# Patient Record
Sex: Male | Born: 1985 | Race: White | Hispanic: Yes | Marital: Single | Smoking: Former smoker
Health system: Southern US, Community
[De-identification: ages and names within clinical notes are randomized; demographics above are authoritative.]

## PROBLEM LIST (undated history)

## (undated) DIAGNOSIS — E559 Vitamin D deficiency, unspecified: Secondary | ICD-10-CM

## (undated) DIAGNOSIS — E079 Disorder of thyroid, unspecified: Secondary | ICD-10-CM

## (undated) DIAGNOSIS — C801 Malignant (primary) neoplasm, unspecified: Secondary | ICD-10-CM

---

## 2009-12-21 ENCOUNTER — Emergency Department (HOSPITAL_COMMUNITY): Admission: EM | Admit: 2009-12-21 | Discharge: 2009-12-21 | Payer: Self-pay | Admitting: Emergency Medicine

## 2012-06-02 ENCOUNTER — Inpatient Hospital Stay (HOSPITAL_COMMUNITY)
Admission: AD | Admit: 2012-06-02 | Discharge: 2012-06-03 | DRG: 836 | Disposition: A | Discharge status: Other Acute Inpatient Hospital | Payer: MEDICAID | Source: Ambulatory Visit | Attending: Internal Medicine | Admitting: Internal Medicine

## 2012-06-02 ENCOUNTER — Encounter (HOSPITAL_COMMUNITY): Payer: Self-pay

## 2012-06-02 ENCOUNTER — Inpatient Hospital Stay (HOSPITAL_COMMUNITY): Payer: MEDICAID

## 2012-06-02 DIAGNOSIS — D72829 Elevated white blood cell count, unspecified: Secondary | ICD-10-CM | POA: Diagnosis present

## 2012-06-02 DIAGNOSIS — R531 Weakness: Secondary | ICD-10-CM | POA: Diagnosis present

## 2012-06-02 DIAGNOSIS — R5383 Other fatigue: Secondary | ICD-10-CM | POA: Diagnosis present

## 2012-06-02 DIAGNOSIS — R5381 Other malaise: Secondary | ICD-10-CM | POA: Diagnosis present

## 2012-06-02 DIAGNOSIS — D696 Thrombocytopenia, unspecified: Secondary | ICD-10-CM | POA: Diagnosis present

## 2012-06-02 DIAGNOSIS — C9500 Acute leukemia of unspecified cell type not having achieved remission: Principal | ICD-10-CM | POA: Diagnosis present

## 2012-06-02 DIAGNOSIS — B356 Tinea cruris: Secondary | ICD-10-CM | POA: Diagnosis present

## 2012-06-02 DIAGNOSIS — D649 Anemia, unspecified: Secondary | ICD-10-CM | POA: Diagnosis present

## 2012-06-02 LAB — IRON AND TIBC
Iron: 326 ug/dL — ABNORMAL HIGH (ref 42–135)
UIBC: <15 ug/dL — ABNORMAL LOW (ref 125–400)

## 2012-06-02 LAB — CBC WITH DIFFERENTIAL/PLATELET
Eosinophils Absolute: 0.1 10*3/uL (ref 0.0–0.7)
Eosinophils Relative: 0 % (ref 0–5)
Hemoglobin: 5 g/dL — CL (ref 13.0–17.0)
Lymphs Abs: 24.2 10*3/uL — ABNORMAL HIGH (ref 0.7–4.0)
MCH: 30.5 pg (ref 26.0–34.0)
MCV: 86.6 fL (ref 78.0–100.0)
Monocytes Absolute: 1.4 10*3/uL — ABNORMAL HIGH (ref 0.1–1.0)
Monocytes Relative: 5 % (ref 3–12)
Platelets: 130 10*3/uL — ABNORMAL LOW (ref 150–400)
RBC: 1.64 MIL/uL — ABNORMAL LOW (ref 4.22–5.81)

## 2012-06-02 LAB — COMPREHENSIVE METABOLIC PANEL
AST: 15 U/L (ref 0–37)
Albumin: 3.8 g/dL (ref 3.5–5.2)
Chloride: 105 mEq/L (ref 96–112)
Creatinine, Ser: 0.89 mg/dL (ref 0.50–1.35)
Potassium: 4.2 mEq/L (ref 3.5–5.1)
Total Bilirubin: 0.4 mg/dL (ref 0.3–1.2)

## 2012-06-02 LAB — ABO/RH: ABO/RH(D): O POS

## 2012-06-02 MED ORDER — SODIUM CHLORIDE 0.9 % IJ SOLN: 3.0000 mL | INTRAMUSCULAR | Status: DC | PRN

## 2012-06-02 MED ORDER — PNEUMOCOCCAL VAC POLYVALENT 25 MCG/0.5ML IJ INJ
0.5000 mL | INJECTION | INTRAMUSCULAR | Status: AC
  Administered 2012-06-03: 0.5 mL via INTRAMUSCULAR
  Filled 2012-06-02: qty 0.5

## 2012-06-02 MED ORDER — ONDANSETRON HCL 4 MG PO TABS: 4.0000 mg | ORAL_TABLET | Freq: Four times a day (QID) | ORAL | Status: DC | PRN

## 2012-06-02 MED ORDER — ONDANSETRON HCL 4 MG/2ML IJ SOLN: 4.0000 mg | Freq: Four times a day (QID) | INTRAMUSCULAR | Status: DC | PRN

## 2012-06-02 MED ORDER — POLYETHYLENE GLYCOL 3350 17 G PO PACK
17.0000 g | PACK | Freq: Every day | ORAL | Status: DC | PRN
  Filled 2012-06-02: qty 1

## 2012-06-02 MED ORDER — SODIUM CHLORIDE 0.9 % IJ SOLN
3.0000 mL | Freq: Two times a day (BID) | INTRAMUSCULAR | Status: DC
  Administered 2012-06-02 – 2012-06-03 (×3): 3 mL via INTRAVENOUS

## 2012-06-02 MED ORDER — INFLUENZA VIRUS VACC SPLIT PF IM SUSP
0.5000 mL | INTRAMUSCULAR | Status: AC
  Administered 2012-06-03: 0.5 mL via INTRAMUSCULAR
  Filled 2012-06-02: qty 0.5

## 2012-06-02 MED ORDER — ACETAMINOPHEN 650 MG RE SUPP: 650.0000 mg | Freq: Four times a day (QID) | RECTAL | Status: DC | PRN

## 2012-06-02 MED ORDER — TERBINAFINE HCL 250 MG PO TABS
250.0000 mg | ORAL_TABLET | Freq: Every day | ORAL | Status: DC
  Administered 2012-06-02 – 2012-06-03 (×2): 250 mg via ORAL
  Filled 2012-06-02 (×2): qty 1

## 2012-06-02 MED ORDER — SODIUM CHLORIDE 0.9 % IV SOLN: 250.0000 mL | INTRAVENOUS | Status: DC | PRN

## 2012-06-02 MED ORDER — ACETAMINOPHEN 325 MG PO TABS
650.0000 mg | ORAL_TABLET | Freq: Four times a day (QID) | ORAL | Status: DC | PRN
  Administered 2012-06-02: 650 mg via ORAL
  Filled 2012-06-02: qty 2

## 2012-06-02 MED ORDER — HEPARIN SODIUM (PORCINE) 5000 UNIT/ML IJ SOLN
5000.0000 [IU] | Freq: Three times a day (TID) | INTRAMUSCULAR | Status: DC
  Administered 2012-06-02 – 2012-06-03 (×5): 5000 [IU] via SUBCUTANEOUS
  Filled 2012-06-02 (×6): qty 1

## 2012-06-02 NOTE — Progress Notes (Signed)
Initial review for inpatient status is complete. 

## 2012-06-02 NOTE — Progress Notes (Signed)
CRITICAL VALUE ALERT  Critical value received:  HGB 5.0  Date of notification:  06/02/2012   Time of notification:  4:02 PM   Critical value read back:yes  Nurse who received alert:  Armanda Heritage   MD notified (1st page):  DR David Stall  Time of first page:  4:03 PM   MD notified (2nd page): David Stall   Time of second page:4:29 PM   Responding MD:  David Stall  Time MD responded:  4:31 PM

## 2012-06-02 NOTE — H&P (Signed)
[facility] History and Physical  Wayne Grimes YQM:[id number] DOB: 1986-04-14 DOA: 06/02/2012  Referring physician: Dr. Alden Benjamin PCP: No primary provider on file.  Specialists: none  Chief Complaint: Generalized weakness  HPI: Wayne Grimes is a 26 y.o. male  This is a 26 year old male with no significant past medical history that went to his primary care doctor 2 days prior to admission he has a generalized weakness. A CBC was done results are back he was called in by her physician for a hemoglobin of 5. He was admitted as a direct admission to [facility]. The patient has been complaining of generalized weakness for 3 weeks prior to admission progressively getting worse. He also relates he has does not have any appetite. He's also been noticing that he is pale. He relates no petechiae, no fever or chills, no night sweats. No difficulty swallowing no joint pain. No rectal bleeding, hematemesis. He denies any NSAIDs use. No lymphadenopathy.  Review of Systems: The patient denies anorexia, fever, weight loss,, vision loss, decreased hearing, hoarseness, chest pain, syncope, dyspnea on exertion, peripheral edema, balance deficits, hemoptysis, abdominal pain, melena, hematochezia, severe indigestion/heartburn, hematuria, incontinence, genital sores, muscle weakness, suspicious skin lesions, transient blindness, difficulty walking, depression, unusual weight change, abnormal bleeding, enlarged lymph nodes, angioedema, and breast masses.    History reviewed. No pertinent past medical history. History reviewed. No pertinent past surgical history. Social History:  reports that he quit smoking about a year ago. His smoking use included Cigarettes. He has a .2 pack-year smoking history. He has never used smokeless tobacco. He reports that he drinks about 1.8 ounces of alcohol per week. He reports that he does not use illicit drugs. Lives at home with his cousins can perform his  ADLs.  Allergies  Allergen Reactions  . Orange Juice (Orange Oil) Other (See Comments)    Eyes swell    History reviewed. No pertinent family history. mother is alive has diabetes. Does not know his father Prior to Admission medications   Not on File   Physical Exam: Filed Vitals:   06/02/12 1120  BP: 132/74  Pulse: 102  Temp: 99.5 F (37.5 C)  Resp: 20  Height: 5\' 8"  (1.727 m)  Weight: 99.791 kg (220 lb)  SpO2: 100%     General:  He is awake alert and oriented x3 in no acute distress  Eyes: Anicteric extraocular movements are intact  ENT: Moist mucous membranes no gingival hypertrophy no lymphadenopathy  Neck: No lymphadenopathy no JVD  Cardiovascular: Regular rate and rhythm  Respiratory: Air movement clear to auscultation  Abdomen: Bowel sounds nontender nondistended and soft. He does have fullness on his left flank  Skin: Tinea corporis in his groin  Musculoskeletal: No Deformities  Psychiatric: Appropriate  Neurologic: Awake alert and oriented x3 nonfocal  Labs on Admission:  Basic Metabolic Panel: No results found for this basename: NA:5,K:5,CL:5,CO2:5,GLUCOSE:5,BUN:5,CREATININE:5,CALCIUM:5,MG:5,PHOS:5 in the last 168 hours Liver Function Tests: No results found for this basename: AST:5,ALT:5,ALKPHOS:5,BILITOT:5,PROT:5,ALBUMIN:5 in the last 168 hours No results found for this basename: LIPASE:5,AMYLASE:5 in the last 168 hours No results found for this basename: AMMONIA:5 in the last 168 hours CBC: No results found for this basename: WBC:5,NEUTROABS:5,HGB:5,HCT:5,MCV:5,PLT:5 in the last 168 hours Cardiac Enzymes: No results found for this basename: CKTOTAL:5,CKMB:5,CKMBINDEX:5,TROPONINI:5 in the last 168 hours  BNP (last 3 results) No results found for this basename: PROBNP:3 in the last 8760 hours CBG: No results found for this basename: GLUCAP:5 in the last 168 hours  Radiological Exams on Admission:  No results found.  EKG: Independently  reviewed: pending  Assessment/Plan Principal Problem: Generalized weakness/ Anemia/ Leukocytosis/ Thrombocytopenia: Per his primary care doctor his hemoglobin was 5 white count was 30 and platelet count was in the 130s. So he was directly admitted for blood transfusion. I am concerned of leukemia (AML) in this 26 year old. Go ahead and check an anemia panel check a TSH, RBC folate and B12. We'll go ahead and get a blood flow cytometry, peripheral smear to be read by the pathologist. Also check his chest x-ray. On physical examination his testicles are within normal limits. Guaiac his stools. Also get an abdominal ultrasound is there is left flank fullness this is probably explained. Lymphoma is also in the differential he noticed no palpable lymphadenopathy,Chest x-ray will help.  Tinea cruris: We'll start him on Lamisil.  Code Status: full Family Communication: none Disposition Plan: home 2-3 days  Time spent: [address] Rosine Beat [facility] Pager [phone]  If 7PM-7AM, please contact night-coverage [email or web address] Password [email or web address] 06/02/2012, 12:31 PM

## 2012-06-03 ENCOUNTER — Inpatient Hospital Stay (HOSPITAL_COMMUNITY): Payer: MEDICAID

## 2012-06-03 DIAGNOSIS — C9500 Acute leukemia of unspecified cell type not having achieved remission: Secondary | ICD-10-CM

## 2012-06-03 LAB — PREPARE RBC (CROSSMATCH)

## 2012-06-03 LAB — PATHOLOGIST SMEAR REVIEW

## 2012-06-03 LAB — SAVE SMEAR

## 2012-06-03 LAB — CBC
HCT: 18.8 % — ABNORMAL LOW (ref 39.0–52.0)
Hemoglobin: 6.4 g/dL — CL (ref 13.0–17.0)
RDW: 15.1 % (ref 11.5–15.5)
WBC: 35.8 10*3/uL — ABNORMAL HIGH (ref 4.0–10.5)

## 2012-06-03 LAB — FOLATE: Folate: 9.4 ng/mL

## 2012-06-03 LAB — HEMOGLOBIN AND HEMATOCRIT, BLOOD: Hemoglobin: 8.4 g/dL — ABNORMAL LOW (ref 13.0–17.0)

## 2012-06-03 NOTE — Progress Notes (Signed)
Clinical Social Worker received referral for pt needing spanish interpreter. Clinical Social Worker was notified by RN that pt MD is spanish speaking and Visual merchandiser provided RN with phone number to Geneticist, molecular in order to arrange spanish speaking interpreter when needed. RN discussed that plan is for pt to transfer to [facility] when bed available. No further social work needs identified at this time. Clinical Social Worker signing off.  Please re-consult if social work needs arise.    Jacklynn Lewis, MSW, LCSWA  Clinical Social Work [phone]

## 2012-06-03 NOTE — Discharge Summary (Signed)
Physician Discharge Summary  Marland Reine GEX:[id number] DOB: 10/17/1985 DOA: 06/02/2012  PCP: No primary provider on file.  Admit date: 06/02/2012 Discharge date: 06/03/2012  Time spent: 30 minutes  Recommendations for Outpatient Follow-up:  1. Transfer to [facility]  Discharge Diagnoses:  Principal Problem:  *Generalized weakness Active Problems:  Anemia  Leukocytosis  Thrombocytopenia  Tinea cruris   Discharge Condition: stable  Diet recommendation: regular  Filed Weights   06/02/12 1120  Weight: 99.791 kg (220 lb)    History of present illness:  This is a 26 year old male with no significant past medical history that went to his primary care doctor 2 days prior to admission he has a generalized weakness. A CBC was done results are back he was called in by her physician for a hemoglobin of 5. He was admitted as a direct admission to [facility]. The patient has been complaining of generalized weakness for 3 weeks prior to admission progressively getting worse. He also relates he has does not have any appetite. He's also been noticing that he is pale. He relates no petechiae, no fever or chills, no night sweats. No difficulty swallowing no joint pain. No rectal bleeding, hematemesis. He denies any NSAIDs use. No lymphadenopathy.   Hospital Course:  Generalized weakness/ Anemia/ Leukocytosis/ Thrombocytopenia:  -peripheral smear compatible with Leukemia at the time of d/c report not in system Discuss with hematologist who has seen the smear. -WBC increase from 26-> 35K, thrombocytopenia a 94 nd anemia s/p transfusion 3 units. He is now symptoms free. -CXR no mass. Abd [place] normal size spleen. -discuss with hematologist and agree that the patient should be transfer to a tertiary center.  Tinea cruris:  We'll start him on Lamisil.  Procedures:  none  Consultations:  Dr. Arline Asp  Discharge Exam: Filed Vitals:   06/02/12 2146 06/02/12 2246 06/02/12  2337 06/03/12 0600  BP: 134/70 126/63 120/64 122/70  Pulse: 92 99 78 78  Temp: 98.7 F (37.1 C) 98.5 F (36.9 C) 98.3 F (36.8 C) 98.2 F (36.8 C)  TempSrc: Oral Oral Oral Oral  Resp: 18 18 16 20   Height:      Weight:      SpO2:    100%    General: A&O x3  Cardiovascular: RRR Respiratory: goos air movement CTA B/L  Discharge Instructions  Discharge Orders    Future Orders Please Complete By Expires   Diet - low sodium heart healthy      Increase activity slowly          Medication List    Notice       You have not been prescribed any medications.           The results of significant diagnostics from this hospitalization (including imaging, microbiology, ancillary and laboratory) are listed below for reference.    Significant Diagnostic Studies: X-ray Chest Pa And Lateral   06/02/2012  *RADIOLOGY REPORT*  Clinical Data: Weakness  CHEST - 2 VIEW  Comparison: None.  Findings: The lungs are clear.  Mediastinal contours appear normal. The heart is within normal limits in size.  No bony abnormality is seen.  IMPRESSION: No active lung disease.   Original Report Authenticated By: Juline Patch, M.D.    Abd 1 View (kub)  06/02/2012  *RADIOLOGY REPORT*  Clinical Data: Weakness.  Abdominal pain.  ABDOMEN - 1 VIEW  Comparison: None  Findings: The bowel gas pattern is normal.  The soft tissue shadows are maintained.  No worrisome calcifications.  The bony structures are intact.  IMPRESSION: Unremarkable abdominal radiograph.   Original Report Authenticated By: P. Loralie Champagne, M.D.    US Abdomen Complete  06/03/2012  *RADIOLOGY REPORT*  Clinical Data:  Abdominal fullness.  COMPLETE ABDOMINAL ULTRASOUND  Comparison:  None.  Findings:  Gallbladder:  No gallstones, gallbladder wall thickening, or pericholecystic fluid.  Common bile duct:   Within normal limits in caliber.  Liver:  No focal lesion identified.  Within normal limits in parenchymal echogenicity.  IVC:  Limited  visualization, grossly unremarkable.  Pancreas:  Limited visualization due to overlying bowel gas, visualized portions unremarkable.  The pancreatic head and pancreatic tail not well visualized.  Spleen:  Within normal limits in size and echotexture.  Right Kidney:   Normal in size and parenchymal echogenicity.  No evidence of mass or hydronephrosis.  Left Kidney:  Normal in size and parenchymal echogenicity.  No evidence of mass or hydronephrosis.  Abdominal aorta:  No aneurysm identified.  IMPRESSION: No acute findings.  Midline structures difficult to visualize due to overlying bowel gas.   Original Report Authenticated By: Charlett Nose, M.D.     Microbiology: No results found for this or any previous visit (from the past 240 hour(s)).   Labs: Basic Metabolic Panel:  Lab 06/02/12 1610  NA 140  K 4.2  CL 105  CO2 25  GLUCOSE 118*  BUN 17  CREATININE 0.89  CALCIUM 9.1  MG --  PHOS --   Liver Function Tests:  Lab 06/02/12 1425  AST 15  ALT 17  ALKPHOS 99  BILITOT 0.4  PROT 7.2  ALBUMIN 3.8   No results found for this basename: LIPASE:5,AMYLASE:5 in the last 168 hours No results found for this basename: AMMONIA:5 in the last 168 hours CBC:  Lab 06/03/12 0200 06/02/12 1425  WBC 35.8* 26.1*  NEUTROABS -- 0.4*  HGB 6.4* 5.0*  HCT 18.8* 14.2*  MCV 86.6 86.6  PLT 94* 130*   Cardiac Enzymes: No results found for this basename: CKTOTAL:5,CKMB:5,CKMBINDEX:5,TROPONINI:5 in the last 168 hours BNP: BNP (last 3 results) No results found for this basename: PROBNP:3 in the last 8760 hours CBG: No results found for this basename: GLUCAP:5 in the last 168 hours     Signed:  Marinda Elk  [facility] 06/03/2012, 10:04 AM

## 2012-06-03 NOTE — Progress Notes (Signed)
Patient's hgb 6.4 after receiving 2 units of prbcs, Dr. Craige Cotta notified. Arlington Calix, RN

## 2012-06-03 NOTE — Consult Note (Signed)
ONCOLOGY  HOSPITAL CONSULTATION NOTE  Wayne Grimes                                MR#: [id number]  DOB: [birth year]                       CSN#: [id number]  Referring MD: Dr. Emmie Niemann Hospitalists, Dr. David Stall   Primary MD: Dr. Brent General   Reason for Consult:  Rule Out Leukemia   Wayne Grimes is a 26 y.o. Hispanic  male with no prior documented medical history, admitted on 06/02/2012 directly from the Urgent Care office at [facility]  after being found to have severe anemia with a Hb of 5, WBC 30,000 and platelets 130, suspicious of acute leukemia. Patient had presented to the facility with 3 week history of generalized weakness and fatigue, decreased appetite and pallor. He denied acute bleeding episodes, rashes, recent infections, fever, chills or night sweats.He denied adenopathy, or areas of pain. Upon admission, new labs demonstrated a H/H of 5/14.2, WBC at 26.1 and platelets 130 k . He received 2 units of blood, with current Hb at 6.4, Hct 18.8 after the  first unit, WBC now at 35.8 and platelets at 94,000. US abdomen is pending. Hemoccult pending.Smear has been reviewed by Dr. Arline Asp, please see below for details. 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 No other labs or films are present for evaluation.   Patient denies any history or family history of leukemia or other bone marrow disorders.Never had a bone marrow biopsy in the past. We were requested to see this patient with recommendations.        PMH: None  Surgeries: None  Allergies:  Allergies  Allergen Reactions  . Orange Juice (Orange Oil) Other (See Comments)    Eyes swell    Medications:   Prior to Admission:  No prescriptions prior to admission      . heparin  5,000 Units Subcutaneous Q8H  . influenza  inactive virus vaccine  0.5 mL Intramuscular Tomorrow-1000  . pneumococcal 23 valent vaccine  0.5 mL Intramuscular Tomorrow-1000  . sodium chloride  3 mL Intravenous Q12H  .  terbinafine  250 mg Oral Daily    YQM:VHQION chloride, acetaminophen, acetaminophen, ondansetron (ZOFRAN) IV, ondansetron, polyethylene glycol, sodium chloride  ROS: See HPI for significant positives. Constitutional: Negative  for fever, chills  night sweats.  Eyes: Negative for blurred vision and double vision.  Respiratory: Negative for productive cough. No hemoptysis.No hoarseness.No neck swelling.  Positive for shortness of breath on exertion. No pleuritic chest pain.  Cardiovascular: Negative for chest pain. No palpitations.  GI: Negative for  nausea, vomiting, diarrhea or constipation. No change in bowel caliber. No  Melena or hematochezia. Negative for abdominal pain.  GU: Negative for hematuria. No loss of urinary control. No urinary retention.No changes in urine color.  Skin: Negative for itching. No rash. No petechia. No easy  Bruising. Musculoskeletal: Denies back tendernes  No arm pain.  Neurological: No headaches.No confusion. No motor or sensory deficits.  Family History:    Mother alive with diabetes. He does not know his father.  2 brothers in good healthNo family members with hematological disorders.    Social History:  reports that he quit smoking about a year ago. His smoking use included Cigarettes. He has a .2 pack-year smoking history. He has never used smokeless tobacco. He reports that  he drinks about 1.8 ounces of alcohol per week. He reports that he does not use illicit drugs.Lives at home with his cousins. Came from [place] 7 years ago. Works as a Designer, fashion/clothing. No insurance.   Physical Exam    Filed Vitals:   06/03/12 0600  BP: 122/70  Pulse: 78  Temp: 98.2 F (36.8 C)  Resp: 72     General:26 y.o. male. in no acute distress A. and O. x3  well-developed, well-nourished.  HEENT: Normocephalic, atraumatic, PERRLA. Sclerae anicteric. Oral cavity without thrush or lesions. NECK:supple. no thyromegaly, no cervical or supraclavicular adenopathy  LUNGS: clear  bilaterally . No wheezing, rhonchi or rales. No axillary masses. BREASTS: not examined. CARDIOVASCULAR: regular rate and rhythm normal S1-S2, no murmur , rubs or gallops ABDOMEN: soft nontender , bowel sounds x4. No HSM. No masses palpable.  GU/rectal: deferred. EXTREMITIES: no clubbing cyanosis or edema. No bruising or petechial rash MUSCULOSKELETAL: no spinal tenderness.  NEURO: Non Focal. No Horner's.   Labs:  CBC   Lab 06/03/12 0200 06/02/12 1425  WBC 35.8* 26.1*  HGB 6.4* 5.0*  HCT 18.8* 14.2*  PLT 94* 130*  MCV 86.6 86.6  MCH 29.5 30.5  MCHC 34.0 35.2  RDW 15.1 15.7*  LYMPHSABS -- 24.2*  MONOABS -- 1.4*  EOSABS -- 0.1  BASOSABS -- 0.0  BANDABS -- --     CMP    Lab 06/02/12 1425  NA 140  K 4.2  CL 105  CO2 25  GLUCOSE 118*  BUN 17  CREATININE 0.89  CALCIUM 9.1  MG --  AST 15  ALT 17  ALKPHOS 99  BILITOT 0.4        Component Value Date/Time   BILITOT 0.4 06/02/2012 1425     No results found for this basename: INR:5,PROTIME:5 in the last 168 hours  No results found for this basename: DDIMER:2 in the last 72 hours   Anemia panel:   Basename 06/02/12 1425  VITAMINB12 408  FOLATE --  FERRITIN 656*  TIBC NOT CALC  IRON 326*  RETICCTPCT 0.3*     Imaging Studies:  X-ray Chest Pa And Lateral   06/02/2012  *RADIOLOGY REPORT*  Clinical Data: Weakness  CHEST - 2 VIEW  Comparison: None.  Findings: The lungs are clear.  Mediastinal contours appear normal. The heart is within normal limits in size.  No bony abnormality is seen.  IMPRESSION: No active lung disease.   Original Report Authenticated By: Juline Patch, M.D.    Abd 1 View (kub)  06/02/2012  *RADIOLOGY REPORT*  Clinical Data: Weakness.  Abdominal pain.  ABDOMEN - 1 VIEW  Comparison: None  Findings: The bowel gas pattern is normal.  The soft tissue shadows are maintained.  No worrisome calcifications.  The bony structures are intact.  IMPRESSION: Unremarkable abdominal radiograph.    Original Report Authenticated By: P. Loralie Champagne, M.D.       A/P: 26 y.o. male admitted with severe anemia, leukocytosis and thrombocytopenia, suspicious of acute leukemia.Smear was evaluated by Dr. Arline Asp highly indicative of acute leukemia. We were kindly requested to evaluate the patient.  Transfer to  [facility] is recommended for further workup and treatment. . Addendum to this note to be written. Thank you for the referral.  [facility] E 06/03/2012 8:49 AM    Patient seen and examined. 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 The patient is in no acute distress without evidence of infection or  bleeding. His physical exam is unremarkable as was his chest x-ray and abdominal ultrasound. The patient speaks little to no [place].  I have spoken with Dr. Robb Matar, the patient's hospitalist, and Dr. Miachel Roux from [facility]. Plans are underway to have the patient transferred to [facility] sometime today for further workup and treatment.  Wayne Grimes 06/03/2012 10:51 AM

## 2012-06-03 NOTE — Progress Notes (Signed)
REPORT CALLED TO [facility], SPOKE WITH JILL JACK, RN, PATIENT WILL BE GOING TO [address], ROOM 6, PATIENT WILL GO TO [facility], Kailany Dinunzio, RN

## 2012-06-04 LAB — TYPE AND SCREEN
ABO/RH(D): O POS
Antibody Screen: NEGATIVE
Unit division: 0

## 2012-06-25 ENCOUNTER — Telehealth: Payer: Self-pay | Admitting: Medical Oncology

## 2012-06-25 NOTE — Telephone Encounter (Signed)
Received a call from Araceli Bouche with [facility] regarding pt update. 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 He will probably be discharged next week. He will need to follow up with Dr. Arline Asp and need follow up labs. Pt is here in the [place] with friends and has no Tree surgeon number or any type of assistance. She was saying that pt will need to get an orange card that is provided by [facility]. He is going to need methotrexate and levaquin in the future and will need help getting these meds. She states she spoke with Clydie Braun at [facility] [phone] ext [phone]. I spoke with Dr. Arline Asp who suggests that we ask Lauren our social worker to help [place]. I spoke with Lauren who is not familiar with the orange card procedure.  She will try to research and get back with me. I did speak with our financial counselors and they can assist pt with a green card to help him here at the cancer center but not with the orange card.

## 2012-07-20 ENCOUNTER — Other Ambulatory Visit: Payer: Self-pay

## 2012-07-20 DIAGNOSIS — C9500 Acute leukemia of unspecified cell type not having achieved remission: Secondary | ICD-10-CM

## 2012-07-21 ENCOUNTER — Other Ambulatory Visit: Payer: Self-pay | Admitting: Oncology

## 2012-07-21 ENCOUNTER — Telehealth: Payer: Self-pay | Admitting: Oncology

## 2012-07-21 DIAGNOSIS — C9500 Acute leukemia of unspecified cell type not having achieved remission: Secondary | ICD-10-CM

## 2012-07-21 NOTE — Telephone Encounter (Signed)
S/W THE INTERPRETER SERVICE TO CONTACT A PT REGARDING HER FINANCIAL COUSELOR APPT ALONG WITH THE INJ APPT FOR THIS Friday 07/23/2012. Pt only speaKS SPANISH

## 2012-07-22 ENCOUNTER — Telehealth: Payer: Self-pay

## 2012-07-22 NOTE — Telephone Encounter (Signed)
Pt calender/schedule and google map with directions to [facility] faxed to [place] at [place] to facilitate discharge planning from [facility]

## 2012-07-23 ENCOUNTER — Emergency Department (HOSPITAL_COMMUNITY)
Admission: EM | Admit: 2012-07-23 | Discharge: 2012-07-23 | Disposition: A | Discharge status: Home / Self Care | Payer: Self-pay

## 2012-07-23 ENCOUNTER — Ambulatory Visit: Payer: Self-pay

## 2012-07-23 ENCOUNTER — Ambulatory Visit (HOSPITAL_BASED_OUTPATIENT_CLINIC_OR_DEPARTMENT_OTHER): Payer: Self-pay

## 2012-07-23 VITALS — BP 137/88 | HR 106 | Temp 99.1°F

## 2012-07-23 DIAGNOSIS — C9500 Acute leukemia of unspecified cell type not having achieved remission: Secondary | ICD-10-CM

## 2012-07-23 MED ORDER — PEGFILGRASTIM INJECTION 6 MG/0.6ML
6.0000 mg | Freq: Once | SUBCUTANEOUS | Status: AC
  Administered 2012-07-23: 6 mg via SUBCUTANEOUS
  Filled 2012-07-23: qty 0.6

## 2012-07-23 NOTE — ED Notes (Signed)
After using interpreter phone I concluded that pt was to be at [facility]. I have called over to [facility] and they are expecting pt. Interpreter phone used to update pt on his POC.

## 2012-07-26 ENCOUNTER — Other Ambulatory Visit: Payer: Self-pay | Admitting: Lab

## 2012-07-26 ENCOUNTER — Telehealth: Payer: Self-pay

## 2012-07-26 ENCOUNTER — Other Ambulatory Visit (HOSPITAL_BASED_OUTPATIENT_CLINIC_OR_DEPARTMENT_OTHER): Payer: Self-pay | Admitting: Lab

## 2012-07-26 DIAGNOSIS — C9500 Acute leukemia of unspecified cell type not having achieved remission: Secondary | ICD-10-CM

## 2012-07-26 LAB — COMPREHENSIVE METABOLIC PANEL (CC13)
AST: 31 U/L (ref 5–34)
Albumin: 3.6 g/dL (ref 3.5–5.0)
Alkaline Phosphatase: 120 U/L (ref 40–150)
BUN: 12 mg/dL (ref 7.0–26.0)
Calcium: 8.8 mg/dL (ref 8.4–10.4)
Chloride: 109 mEq/L — ABNORMAL HIGH (ref 98–107)
Creatinine: 0.7 mg/dL (ref 0.7–1.3)
Glucose: 96 mg/dl (ref 70–99)

## 2012-07-26 LAB — CBC WITH DIFFERENTIAL/PLATELET
BASO%: 0.5 % (ref 0.0–2.0)
EOS%: 0.6 % (ref 0.0–7.0)
MCH: 31.3 pg (ref 27.2–33.4)
MCHC: 36.1 g/dL — ABNORMAL HIGH (ref 32.0–36.0)
RDW: 17.3 % — ABNORMAL HIGH (ref 11.0–14.6)
WBC: 3.1 10*3/uL — ABNORMAL LOW (ref 4.0–10.3)
lymph#: 0.1 10*3/uL — ABNORMAL LOW (ref 0.9–3.3)

## 2012-07-26 NOTE — Telephone Encounter (Signed)
S/w triage nurse that pt states one of his meds is giving him a headache. She stated she would contact his MD at [facility] and call us back if any suggestions. His med list is acyclovir, lovenox, diflucan, levaquin, prednisolone eye drops, compazine and bactrim.

## 2012-07-26 NOTE — Progress Notes (Signed)
Hgb=10.2, plt=61. These parameters are above those set for need of transfusion. Pt sent home. Labs faxed to [facility]

## 2012-07-28 ENCOUNTER — Other Ambulatory Visit: Payer: Self-pay | Admitting: Oncology

## 2012-07-29 ENCOUNTER — Other Ambulatory Visit (HOSPITAL_BASED_OUTPATIENT_CLINIC_OR_DEPARTMENT_OTHER): Payer: Self-pay | Admitting: Lab

## 2012-07-29 ENCOUNTER — Other Ambulatory Visit: Payer: Self-pay | Admitting: Family

## 2012-07-29 ENCOUNTER — Ambulatory Visit (HOSPITAL_BASED_OUTPATIENT_CLINIC_OR_DEPARTMENT_OTHER): Payer: Self-pay

## 2012-07-29 ENCOUNTER — Encounter (HOSPITAL_COMMUNITY)
Admission: RE | Admit: 2012-07-29 | Discharge: 2012-07-29 | Disposition: A | Discharge status: Home / Self Care | Payer: Self-pay | Source: Ambulatory Visit | Attending: Oncology | Admitting: Oncology

## 2012-07-29 ENCOUNTER — Other Ambulatory Visit: Payer: Self-pay

## 2012-07-29 VITALS — BP 117/79 | HR 96 | Temp 97.2°F | Resp 19

## 2012-07-29 DIAGNOSIS — C9500 Acute leukemia of unspecified cell type not having achieved remission: Secondary | ICD-10-CM | POA: Insufficient documentation

## 2012-07-29 DIAGNOSIS — D649 Anemia, unspecified: Secondary | ICD-10-CM

## 2012-07-29 LAB — PREPARE RBC (CROSSMATCH)

## 2012-07-29 LAB — CBC WITH DIFFERENTIAL/PLATELET
HCT: 24.4 % — ABNORMAL LOW (ref 38.4–49.9)
HGB: 8.9 g/dL — ABNORMAL LOW (ref 13.0–17.1)
MCH: 30.9 pg (ref 27.2–33.4)
RDW: 16.2 % — ABNORMAL HIGH (ref 11.0–14.6)
WBC: <0.2 10*3/uL — CL (ref 4.0–10.3)

## 2012-07-29 LAB — HOLD TUBE, BLOOD BANK

## 2012-07-29 MED ORDER — SODIUM CHLORIDE 0.9 % IJ SOLN
10.0000 mL | INTRAMUSCULAR | Status: DC | PRN
  Administered 2012-07-29: 10 mL via INTRAVENOUS
  Filled 2012-07-29: qty 10

## 2012-07-29 MED ORDER — SODIUM CHLORIDE 0.9 % IV SOLN
250.0000 mL | Freq: Once | INTRAVENOUS | Status: AC
  Administered 2012-07-29: 250 mL via INTRAVENOUS

## 2012-07-29 MED ORDER — HEPARIN SOD (PORK) LOCK FLUSH 100 UNIT/ML IV SOLN
500.0000 [IU] | Freq: Once | INTRAVENOUS | Status: AC
  Administered 2012-07-29: 500 [IU] via INTRAVENOUS
  Filled 2012-07-29: qty 5

## 2012-07-29 NOTE — Progress Notes (Signed)
Pt stated [facility] called him Monday after I called [facility] about his headache. They explained it was from his intrathecal chemo.

## 2012-07-29 NOTE — Patient Instructions (Addendum)
Informacin sobre la transfusin de plaquetas (Platelet Transfusion Information) Esta es informacin acerca de la transfusin de plaquetas. Las plaquetas son pequeas clulas producidas en la mdula sea y que se encuentran en la sangre. Cuando un vaso sanguneo se daa, las plaquetas fluyen hacia la zona lesionada para formar un cogulo. Este procedimiento hace comenzar el proceso de curacin. Cuando el nivel de plaquetas es muy bajo en su sangre, podr tener problemas de coagulacin. Esto puede deberse a:  Public relations account executive.  Quim[place]erapia para tratar Science writer. A menudo, muchas veces los niveles bajos de plaquetas no causan problemas.  Las plaquetas pueden durar entre 7 y 35 das. Si no se utilizan en una lesin el bazo o el hgado las rompen. Los sntomas de un bajo recuento de plaquetas incluyen:  Hemorragias nasales.  Encas que sangran.  Abundante sangrado menstrual.  Hematomas y pequeos puntos rojos en la piel.  Los pequeos puntos de la piel se denominan petequias.  Grandes hematomas(prpura).  La hemorragia puede ser ms seria si sucede en el cerebro o el intestino. Las transfusiones de plaquetas a menudo se utilizan para [employer]maker aceptable. Es poco comn que se produzca una hemorragia grave debido a la baja cantidad de plaquetas. RIESGOS Y COMPLICACIONES  No son frecuentes los efectos secundarios graves de [place]. Las reacciones menores pueden incluir:  Picazn  Erupciones  Temperaturas alta y escalofros. Existen medicamentos disponibles para [employer] a las transfusiones. Comente con el profesional que lo asiste si tiene alguno de [employer].  Si recibe transfusiones de plaquetas de manera frecuente pueden ser menos efectivas. Esto se denomina ser refractario a la plaquetas. Esto no es frecuente. Puede suceder por causas inmunes y no inmunes. Las causas no inmunes incluyen:  Fiebre alta.  Algunos  medicamentos.  Bazo agrandado. Las causas inmunes se producen cuando su organismo detecta que las plaquetas no le son propias y fabrican anticuerpos en contra de ellas. [place] plaquetas rpidamente. An con la transfusin de plaquetas usted podra continuar con problemas de sangrado o hematomas. Comunquele a sus mdicos si esto le sucede. Hay otras medidas para tomar si esto le sucede. ANTES DEL [place] controlar su nivel de plaquetas de forma regular.  Si el nivel es muy bajo ser necesario realizarle una transfusin.  Esto es ms importante antes de ciertos procedimiento con riesgo de hemorragia como una puncin espinal.  La transfusin de plaquetas reduce el riesgo de hemorragias durante o despus del procedimiento.  Excepto durante emergencias, la transfusin requiere un consentimiento escrito. Antes de extraer sangre de un donante, se tomar un historial completo para asegurarse que la persona no tiene antecedentes de enfermedades previas y no est presenta conductas sociales de [place]. Son ejemplo de estas ltimas el consumo de drogas por va intravenosa o la actividad sexual con mltiples parejas. Esto podra llevar a que fueran utilizados tanto sangre como sus derivados estando infectados. Esto se realiza incluso adems del exhaustivo control para asegurarse de que la sangre es segura. Todos los productos sanguneos que se transfunden se estudian para asegurarse de que son compatibles con la persona que la recibe. Tambin se controla para descartar la presencia de infecciones. Actualmente la sangre de transfusin es ms segura que nunca antes. El riesgo de infeccin es [place]. [place] plaquetas se almacenan en pequeas bolsas plsticas que se guardan a bajas temperaturas.  Cada bolsa se denomina unidad y a Clinical cytogeneticist se administran dos. Se  administran a travs de una gua intravenosa por goteo durante aproximadamente media hora.  Por lo  general la sangre se obtiene de muchas personas para tener la cantidad suficiente para transfundir.  A veces, las plaquetas se obtienen de Teacher, music. Esto se realiza mediante una mquina especial que separa las plaquetas de la [place]. Esta se denomina mquina de afresis. Las plaquetas obtenidas por esta va se denominan plaquetas por afresis. Las plaquetas por afresis reducen el riesgo de volverse sensible a las plaquetas. Esto disminuye las posibilidades de tener una reaccin a la transfusin.  Como lleva muy poco tiempo donar plaquetas, este tratamiento puede administrarse en un consultorio externo. Las plaquetas tambin pueden administrarse antes o despus de otros tratamientos. SOLICITE ATENCIN MDICA INMEDIATAMENTE SI: Aparecen algunos de los siguientes sntomas en las 12 horas o en los das siguientes:  Siente escalofros.  Sube la fiebre, con una temperatura superior a los 102 F (38.9 C)  Dolores de espalda o [employer] personas que lo rodean creen que usted no Secondary school teacher o lo ven confundido  Sangre al orinar o en la deposicin o hemorragia en cualquier parte del cuerpo.  Le falta la respiracin o presenta dificultades respiratorias.  Mareos.  Desmayos  Desarrolla un sarpullido o urticaria.  Disminuye la cantidad de [place], o sta toma un color oscuro o cambia hacia un tono rosado, rojo o [place].  Dolor de cabeza intenso o rigidez en el cuello.  Se le forman hematomas fcilmente. Document Released: 11/06/2008 Document Revised: 10/13/2011 [facility] Patient Information 2013 [place], [place].

## 2012-07-30 ENCOUNTER — Ambulatory Visit (HOSPITAL_BASED_OUTPATIENT_CLINIC_OR_DEPARTMENT_OTHER): Payer: Self-pay

## 2012-07-30 VITALS — BP 128/75 | HR 95 | Temp 99.0°F | Resp 20

## 2012-07-30 DIAGNOSIS — C9500 Acute leukemia of unspecified cell type not having achieved remission: Secondary | ICD-10-CM

## 2012-07-30 LAB — PREPARE PLATELET PHERESIS: Unit division: 0

## 2012-07-30 MED ORDER — SODIUM CHLORIDE 0.9 % IJ SOLN
10.0000 mL | INTRAMUSCULAR | Status: AC | PRN
  Administered 2012-07-30: 10 mL
  Filled 2012-07-30: qty 10

## 2012-07-30 MED ORDER — HEPARIN SOD (PORK) LOCK FLUSH 100 UNIT/ML IV SOLN
500.0000 [IU] | Freq: Once | INTRAVENOUS | Status: AC
  Administered 2012-07-30: 500 [IU] via INTRAVENOUS
  Filled 2012-07-30: qty 5

## 2012-07-30 NOTE — Patient Instructions (Addendum)
Informacin sobre la transfusin de sangre (Blood Transfusion Information) QU ES UNA TRANSFUSIN DE SANGRE? Una transfusin consiste en el reemplazo de todos o algunos de los componentes de la sangre. La sangre est formada por mltiples clulas que cumplen diferentes funciones.  Los glbulos rojos llevan el oxgeno y se usan para reemplazar prdidas sanguneas.  Los glbulos blancos luchan contra las infecciones.  Las plaquetas controlan las hemorragias.  El plasma provee factores coagulantes.  Existen otros productos disponibles para necesidades especiales como la hemofilia u otros problemas de coagulacin. ANTES DE LA TRANSFUSIN Quin puede donar sangre para transfusiones?   Usted puede donar sangre para ser utilizada ms tarde en usted mismo ( esto se denomina donacin autloga).  Se puede solicitar a los familiares que donen sangre. Esto, generalmente, no es ms seguro que si recibe sangre de un extrao.  Es segura la sangre de voluntarios sanos que hayan sido completamente evaluados. Esto es un banco de sangre. Una transfusin de sangre es ahora ms segura que nunca antes en la prctica de la medicina. Antes de obtener sangre de un donante, se le confecciona una historia completa para asegurarse de que esa persona no tenga antecedentes de enfermedades, ni est involucrado en conductas de riesgo (por ejemplo el uso de drogas intravenosas o actividad sexual con mltiples parejas). Se controlar el historial de viajes del donante para minimizar los riesgos de la transmisin de infecciones como la malaria. La sangre donada se analizar para descartar enfermedades infecciosas como VIH y hepatitis. Luego se probar para asegurarse de que es compatible con usted para minimizar las posibilidades de una reaccin a la transfusin. Si usted o un familiar suyo donan sangre, sto se realiza con anterioridad a una ciruga y no es adecuado en situaciones de emergencia. El proceso de donacin toma  varios das. RIESGOS Y COMPLICACIONES Aunque la terapia de transfusin es muy segura y salva muchas vidas, sus principales peligros incluyen:   El contacto con enfermedades infecciosas.  El desarrollo de una reaccin. Se trata de una reaccin alrgica a algn compuesto de la sangre que se recibi. Para prevenir esta situacin, se toman todas las medidas posibles. Estas decisiones son consideradas detenidamente por su mdico antes de la transfusin. No se realiza una transfusin de sangre a menos que los beneficios sean mayores que los riesgos. DESPUS DEL PROCEDIMIENTO  Inmediatamente despus de recibir una transfusin, se sentir mucho mejor y con ms energa. Esto se percibe especialmente si usted estaba muy anmico. Significa que sus glbulos rojos haban disminuido. La transfusin eleva el nivel de los glbulos rojos que son los que transportan oxgeno y esto generalmente hace que la energa aumente.  Los enfermeros que hayan administrado la transfusin lo controlarn cuidadosamente ante cualquier complicacin. INSTRUCCIONES PARA EL CUIDADO DOMICILIARIO No existen instrucciones especiales para despus de una transfusin. Se sentir ms enrgico. Hable con el mdico acerca de cualquier limitacin sobre las actividades respecto de las enfermedades subyacentes que pueden aparecer como un ataque cardaco.  SOLICITE ATENCIN MDICA SI:  Su enfermedad no mejora luego de la transfusin.  Desarrolla enrojecimiento o irritacin en la zona donde se ha colocado la lnea intravenosa. SOLICITE ATENCIN MDICA DE INMEDIATO SI: Aparecen algunos de los siguientes sntomas en las 12 horas siguientes:  Siente escalofros.  Usted tiene una temperatura oral de ms de 102 F (38.9 C) y no puede controlarla con medicamentos.  Dolores de espalda, pecho, o musculares.  Las personas que lo rodean creen que usted no acta correctamente o lo ven confundido.    Le falta la respiracin o presenta dificultades  respiratorias.  Mareos o sufre desmayos.  Desarrolla un sarpullido o urticaria.  Disminuye la cantidad de orina.  La orina se torna de color oscuro o cambia a un color rosado, rojo o marrn. Aparecen algunos de los siguientes sntomas en los 10 das posteriores a la transfusin:  Usted tiene una temperatura oral de ms de 102 F (38.9 C) y no puede controlarla con medicamentos.  Falta de aire.  Debilidad luego de las actividades normales.  Disminucin en la cantidad de orina, u orina con menos frecuencia.  La parte blanca del ojo se vuelve amarilla (Ictericia).  El color de la orina se vuelve rosado, rojo o marrn. Document Released: 07/21/2005 Document Revised: 10/13/2011 ExitCare Patient Information 2013 ExitCare[employer] LLC.  

## 2012-07-31 LAB — TYPE AND SCREEN: ABO/RH(D): O POS

## 2012-08-02 ENCOUNTER — Encounter: Payer: Self-pay | Admitting: Oncology

## 2012-08-02 ENCOUNTER — Other Ambulatory Visit: Payer: Self-pay | Admitting: Oncology

## 2012-08-02 NOTE — Progress Notes (Signed)
Pt is approved for 100% financial assistance effective 08/02/12 - 01/31/13.  I will mail approval letter and green card today.

## 2012-08-05 ENCOUNTER — Ambulatory Visit: Payer: Self-pay

## 2012-08-05 ENCOUNTER — Encounter: Payer: Self-pay | Admitting: Oncology

## 2012-08-05 ENCOUNTER — Other Ambulatory Visit (HOSPITAL_BASED_OUTPATIENT_CLINIC_OR_DEPARTMENT_OTHER): Payer: Self-pay

## 2012-08-05 ENCOUNTER — Telehealth: Payer: Self-pay

## 2012-08-05 ENCOUNTER — Other Ambulatory Visit: Payer: Self-pay

## 2012-08-05 DIAGNOSIS — C9500 Acute leukemia of unspecified cell type not having achieved remission: Secondary | ICD-10-CM

## 2012-08-05 LAB — HOLD TUBE, BLOOD BANK

## 2012-08-05 LAB — CBC WITH DIFFERENTIAL/PLATELET
BASO%: 0 % (ref 0.0–2.0)
EOS%: 0.1 % (ref 0.0–7.0)
MCH: 30.6 pg (ref 27.2–33.4)
MCV: 83.3 fL (ref 79.3–98.0)
MONO%: 18.1 % — ABNORMAL HIGH (ref 0.0–14.0)
RBC: 3.4 10*6/uL — ABNORMAL LOW (ref 4.20–5.82)
RDW: 15.1 % — ABNORMAL HIGH (ref 11.0–14.6)
lymph#: 0.2 10*3/uL — ABNORMAL LOW (ref 0.9–3.3)

## 2012-08-05 NOTE — Progress Notes (Signed)
Checked in patient. He had no insurance. He speaks little [place], and he called friend who translated for him. He has applied for medicaid and I also gave him an application for assistance also.

## 2012-08-05 NOTE — Progress Notes (Signed)
hgb 10.4 with hct 28.3.  Spoke to [employer] RN, per MD, pt. Will not require blood tranfusion today.  Olegario Messier will speak to pt. In lobby. HL

## 2012-08-05 NOTE — Telephone Encounter (Signed)
Cbc/diff faxed to [facility]

## 2012-08-09 ENCOUNTER — Encounter (HOSPITAL_COMMUNITY)
Admission: RE | Admit: 2012-08-09 | Discharge: 2012-08-09 | Disposition: A | Discharge status: Home / Self Care | Payer: Self-pay | Source: Ambulatory Visit | Attending: Oncology | Admitting: Oncology

## 2012-08-09 ENCOUNTER — Encounter: Payer: Self-pay | Admitting: Medical Oncology

## 2012-08-09 ENCOUNTER — Other Ambulatory Visit (HOSPITAL_BASED_OUTPATIENT_CLINIC_OR_DEPARTMENT_OTHER): Payer: Self-pay | Admitting: Lab

## 2012-08-09 ENCOUNTER — Ambulatory Visit: Payer: Self-pay

## 2012-08-09 DIAGNOSIS — C9500 Acute leukemia of unspecified cell type not having achieved remission: Secondary | ICD-10-CM

## 2012-08-09 LAB — CBC WITH DIFFERENTIAL/PLATELET
BASO%: 0.3 % (ref 0.0–2.0)
LYMPH%: 3 % — ABNORMAL LOW (ref 14.0–49.0)
MCHC: 36.4 g/dL — ABNORMAL HIGH (ref 32.0–36.0)
MONO#: 0.6 10*3/uL (ref 0.1–0.9)
NEUT#: 3.2 10*3/uL (ref 1.5–6.5)
RBC: 3.26 10*6/uL — ABNORMAL LOW (ref 4.20–5.82)
RDW: 15 % — ABNORMAL HIGH (ref 11.0–14.6)
WBC: 4 10*3/uL (ref 4.0–10.3)
lymph#: 0.1 10*3/uL — ABNORMAL LOW (ref 0.9–3.3)

## 2012-08-09 LAB — COMPREHENSIVE METABOLIC PANEL (CC13)
ALT: 16 U/L (ref 0–55)
AST: 15 U/L (ref 5–34)
Calcium: 8.6 mg/dL (ref 8.4–10.4)
Chloride: 107 mEq/L (ref 98–107)
Creatinine: 0.8 mg/dL (ref 0.7–1.3)
Potassium: 3.8 mEq/L (ref 3.5–5.1)
Sodium: 139 mEq/L (ref 136–145)
Total Protein: 5.6 g/dL — ABNORMAL LOW (ref 6.4–8.3)

## 2012-08-09 LAB — HOLD TUBE, BLOOD BANK

## 2012-08-09 NOTE — Progress Notes (Signed)
Hgb 9.8 Plts 342. No need for transfusion today. Spoke with patient in lobby.

## 2012-08-12 ENCOUNTER — Telehealth: Payer: Self-pay | Admitting: Medical Oncology

## 2012-08-12 ENCOUNTER — Other Ambulatory Visit: Payer: Self-pay | Admitting: Lab

## 2012-08-12 NOTE — Telephone Encounter (Signed)
I called pt to inquire about his missed lab appointments today. The person speaking only states do not speak English very well.

## 2012-08-12 NOTE — Telephone Encounter (Signed)
I called pt and someone answered the phone and states they only speak very little [place]. I tried to explain he missed his lab appointment here today. I called [facility] to let them know he did not show today. He had labs with [place] 08/09/12 and they were all good. They will notify Dr. Lowell Guitar. Pt is to return to [facility] 08/17/12 for chemo. If they need [place] to reschedule pt call us.

## 2012-08-13 ENCOUNTER — Telehealth: Payer: Self-pay

## 2012-08-13 NOTE — Telephone Encounter (Signed)
tarra from [facility] called and stated they had talked w/pt on Wed and told him he did not need to come in on 1/9 for labs, sorry they did not contact us with the change.

## 2012-08-15 ENCOUNTER — Encounter: Payer: Self-pay | Admitting: Oncology

## 2012-08-15 NOTE — Progress Notes (Signed)
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 We immediately had him transferred to [facility] where he has been undergoing treatment on CALGB protocol 16109.  The initial bone marrow showed 94% blasts. FISH for BCR/ABL on the peripheral blood was negative.  During the patient's initial admission there he developed a PICC line related DVT involving the left subclavian and axillary veins. He is being treated with Lovenox 140 mg daily. He apparently was discharged from the hospital on 06/30/2012 and felt to be in remission.  The most recent office note from [facility] was dated 07/09/2012. The patient had a bone marrow on that date. Findings were consistent with remission with normal cytogenetics. Performance status was 0. The plan was to admit the patient to the hospital on 07/20/2012 to receive Module B of the chemotherapy program which was to include intrathecal methotrexate, cytarabine and dexamethasone. Plan was to place a Port-A-Cath during this admission.  Contact information:  [address] [phone].

## 2012-08-15 NOTE — Progress Notes (Signed)
We have a note from the patient's admission from 07/20/2012 through 07/22/2012. He was admitted for course 2 Module B as per CALGB 16109.  He received cyclophosphamide, cytarabine and intrathecal methotrexate with Solu-Cortef. On 07/21/2012 a right internal jugular double lumen power injectable Port-A-Cath was placed.

## 2012-08-19 ENCOUNTER — Telehealth: Payer: Self-pay | Admitting: *Deleted

## 2012-08-19 ENCOUNTER — Other Ambulatory Visit: Payer: Self-pay | Admitting: Medical Oncology

## 2012-08-19 ENCOUNTER — Telehealth: Payer: Self-pay | Admitting: Oncology

## 2012-08-19 DIAGNOSIS — C9500 Acute leukemia of unspecified cell type not having achieved remission: Secondary | ICD-10-CM

## 2012-08-19 NOTE — Telephone Encounter (Signed)
Added lb/blood for 1/20 and 1/23. Per Zella Ball Agricultural engineer) pt at [facility] and she will call [place] w/appts.

## 2012-08-19 NOTE — Telephone Encounter (Signed)
Per staff phone call and POF I have schedueld appts.  JMW  

## 2012-08-23 ENCOUNTER — Other Ambulatory Visit (HOSPITAL_BASED_OUTPATIENT_CLINIC_OR_DEPARTMENT_OTHER): Payer: Self-pay | Admitting: Lab

## 2012-08-23 ENCOUNTER — Telehealth: Payer: Self-pay

## 2012-08-23 ENCOUNTER — Ambulatory Visit: Payer: Self-pay

## 2012-08-23 DIAGNOSIS — C9500 Acute leukemia of unspecified cell type not having achieved remission: Secondary | ICD-10-CM

## 2012-08-23 LAB — CBC WITH DIFFERENTIAL/PLATELET
Basophils Absolute: 0 10*3/uL (ref 0.0–0.1)
EOS%: 0 % (ref 0.0–7.0)
HCT: 34.8 % — ABNORMAL LOW (ref 38.4–49.9)
HGB: 11.3 g/dL — ABNORMAL LOW (ref 13.0–17.1)
MCH: 30.2 pg (ref 27.2–33.4)
MCV: 93 fL (ref 79.3–98.0)
MONO%: 17.7 % — ABNORMAL HIGH (ref 0.0–14.0)
NEUT%: 54.9 % (ref 39.0–75.0)
RDW: 20.2 % — ABNORMAL HIGH (ref 11.0–14.6)

## 2012-08-23 LAB — COMPREHENSIVE METABOLIC PANEL (CC13)
CO2: 24 mEq/L (ref 22–29)
Creatinine: 0.7 mg/dL (ref 0.7–1.3)
Glucose: 103 mg/dl — ABNORMAL HIGH (ref 70–99)
Total Bilirubin: 0.96 mg/dL (ref 0.20–1.20)

## 2012-08-23 LAB — MAGNESIUM (CC13): Magnesium: 2 mg/dl (ref 1.5–2.5)

## 2012-08-23 NOTE — Progress Notes (Signed)
hgb 11.3, blood tnsf not indicated/  dmr

## 2012-08-23 NOTE — Telephone Encounter (Signed)
Labs from 08/23/12 faxed to [facility].

## 2012-08-26 ENCOUNTER — Other Ambulatory Visit (HOSPITAL_BASED_OUTPATIENT_CLINIC_OR_DEPARTMENT_OTHER): Payer: Self-pay | Admitting: Lab

## 2012-08-26 ENCOUNTER — Ambulatory Visit: Payer: Self-pay

## 2012-08-26 DIAGNOSIS — C9500 Acute leukemia of unspecified cell type not having achieved remission: Secondary | ICD-10-CM

## 2012-08-26 LAB — CBC WITH DIFFERENTIAL/PLATELET
BASO%: 1.2 % (ref 0.0–2.0)
Basophils Absolute: 0 10*3/uL (ref 0.0–0.1)
HCT: 34.4 % — ABNORMAL LOW (ref 38.4–49.9)
HGB: 11.7 g/dL — ABNORMAL LOW (ref 13.0–17.1)
MONO#: 0.3 10*3/uL (ref 0.1–0.9)
NEUT%: 72.7 % (ref 39.0–75.0)
WBC: 3.4 10*3/uL — ABNORMAL LOW (ref 4.0–10.3)
lymph#: 0.6 10*3/uL — ABNORMAL LOW (ref 0.9–3.3)

## 2012-08-26 LAB — COMPREHENSIVE METABOLIC PANEL (CC13)
AST: 84 U/L — ABNORMAL HIGH (ref 5–34)
Albumin: 2.5 g/dL — ABNORMAL LOW (ref 3.5–5.0)
BUN: 13 mg/dL (ref 7.0–26.0)
Calcium: 8.4 mg/dL (ref 8.4–10.4)
Chloride: 108 mEq/L — ABNORMAL HIGH (ref 98–107)
Glucose: 81 mg/dl (ref 70–99)
Potassium: 4.1 mEq/L (ref 3.5–5.1)
Total Protein: 5.5 g/dL — ABNORMAL LOW (ref 6.4–8.3)

## 2012-08-26 NOTE — Progress Notes (Signed)
Pt. Here for possible blood transfusion.  Today Hgb 11.7;  Hct 34.4; platelets 227.  He does not meet parameters for transfusion per [facility].   Discussed with Felicita Gage RN.   Talked with patient through interpreter.  He verbalizes understanding.  He has an appt. This coming Tuesday with Dr. Lowell Guitar [facility].

## 2012-09-01 ENCOUNTER — Other Ambulatory Visit: Payer: Self-pay

## 2012-09-01 ENCOUNTER — Telehealth: Payer: Self-pay | Admitting: *Deleted

## 2012-09-01 DIAGNOSIS — C9500 Acute leukemia of unspecified cell type not having achieved remission: Secondary | ICD-10-CM

## 2012-09-01 NOTE — Telephone Encounter (Signed)
Per stzff phone call I have scheduled appts for 2/4.  JMW

## 2012-09-02 ENCOUNTER — Telehealth: Payer: Self-pay | Admitting: Oncology

## 2012-09-02 NOTE — Telephone Encounter (Signed)
s/w pt  and he is aware of his appt on 2/4

## 2012-09-05 ENCOUNTER — Encounter: Payer: Self-pay | Admitting: Oncology

## 2012-09-05 NOTE — Progress Notes (Signed)
We have notes from [facility] dated 08/17/2012 (cancer center visit) and a hospital admission from 08/17/2012 through 08/19/2012.  This patient apparently had a left upper extremity PICC line associated DVT diagnosed 06/25/2012 for which he received daily Lovenox 1.5 mg per kilogram. The patient does not have insurance nor does he speak [place]. He was unable to pay for Lovenox. He had been given Lovenox from the drug exchange program at [facility].  When he presented for his scheduled outpatient visit on 08/17/2012 he was found to have some right neck swelling. He had been off of Lovenox for the previous 2 weeks. He was diagnosed with another DVT in the right internal jugular vein associated with a Port-A-Cath. The patient apparently was admitted for a couple of days. He is now to be treated with Arixtra which will be provided through an assistance program. It was stated that there was no contraindication to use the Port-A-Cath despite the DVT.  In this discharge summary dictation, it was stated that the patient's had received course 2, module A with Cytoxan, cytarabine and intrathecal methotrexate from 07/20/2012 through 07/22/2012. He then received day 15 Peg-asparaginase on 08/03/2012 as an outpatient.  It should be pointed out that in a previous communication, it was stated that the patient received course 2, module B from 07/20/2012 through 07/22/2012. Thus we have a slight discrepancy regarding whether this was module A or module B.  On 08/17/2012, white count was 0.9 with an ANC of 0.6, hemoglobin 10.8, hematocrit 32.2 and platelet count 432,000.

## 2012-09-06 ENCOUNTER — Other Ambulatory Visit: Payer: Self-pay | Admitting: *Deleted

## 2012-09-06 ENCOUNTER — Encounter (HOSPITAL_COMMUNITY)
Admission: RE | Admit: 2012-09-06 | Discharge: 2012-09-06 | Disposition: A | Discharge status: Home / Self Care | Payer: No Typology Code available for payment source | Source: Ambulatory Visit | Attending: Oncology | Admitting: Oncology

## 2012-09-06 ENCOUNTER — Encounter: Payer: Self-pay | Admitting: Oncology

## 2012-09-06 DIAGNOSIS — C9500 Acute leukemia of unspecified cell type not having achieved remission: Secondary | ICD-10-CM | POA: Insufficient documentation

## 2012-09-06 DIAGNOSIS — C9200 Acute myeloblastic leukemia, not having achieved remission: Secondary | ICD-10-CM | POA: Insufficient documentation

## 2012-09-06 NOTE — Progress Notes (Signed)
This patient was evaluated at [facility] on 08/31/2012 at which time he was admitted to the hospital for cycle 3 day 1 of treatment  The patient continues on Arixtra 7.5 mg daily, acyclovir 400 mg twice daily, Diflucan 200 mg daily and Bactrim DS one tablet every Monday Wednesday and Friday.

## 2012-09-07 ENCOUNTER — Telehealth: Payer: Self-pay

## 2012-09-07 ENCOUNTER — Other Ambulatory Visit (HOSPITAL_BASED_OUTPATIENT_CLINIC_OR_DEPARTMENT_OTHER): Payer: Self-pay | Admitting: Lab

## 2012-09-07 DIAGNOSIS — C9500 Acute leukemia of unspecified cell type not having achieved remission: Secondary | ICD-10-CM

## 2012-09-07 LAB — CBC WITH DIFFERENTIAL/PLATELET
BASO%: 0.6 % (ref 0.0–2.0)
HCT: 26.7 % — ABNORMAL LOW (ref 38.4–49.9)
HGB: 9.3 g/dL — ABNORMAL LOW (ref 13.0–17.1)
MCHC: 34.9 g/dL (ref 32.0–36.0)
MONO#: 0.1 10*3/uL (ref 0.1–0.9)
NEUT#: 2.7 10*3/uL (ref 1.5–6.5)
NEUT%: 82.5 % — ABNORMAL HIGH (ref 39.0–75.0)
WBC: 3.2 10*3/uL — ABNORMAL LOW (ref 4.0–10.3)
lymph#: 0.4 10*3/uL — ABNORMAL LOW (ref 0.9–3.3)

## 2012-09-07 LAB — COMPREHENSIVE METABOLIC PANEL (CC13)
ALT: 254 U/L — ABNORMAL HIGH (ref 0–55)
AST: 94 U/L — ABNORMAL HIGH (ref 5–34)
Albumin: 2.8 g/dL — ABNORMAL LOW (ref 3.5–5.0)
BUN: 8.8 mg/dL (ref 7.0–26.0)
Calcium: 8.5 mg/dL (ref 8.4–10.4)
Chloride: 106 mEq/L (ref 98–107)
Potassium: 3.8 mEq/L (ref 3.5–5.1)

## 2012-09-07 NOTE — Telephone Encounter (Signed)
09/07/12 labs faxed

## 2012-09-09 ENCOUNTER — Encounter: Payer: Self-pay | Admitting: Medical Oncology

## 2012-09-09 ENCOUNTER — Other Ambulatory Visit: Payer: Self-pay | Admitting: Medical Oncology

## 2012-09-09 ENCOUNTER — Ambulatory Visit (HOSPITAL_BASED_OUTPATIENT_CLINIC_OR_DEPARTMENT_OTHER): Payer: Self-pay | Admitting: Lab

## 2012-09-09 ENCOUNTER — Telehealth: Payer: Self-pay | Admitting: Oncology

## 2012-09-09 ENCOUNTER — Ambulatory Visit (HOSPITAL_BASED_OUTPATIENT_CLINIC_OR_DEPARTMENT_OTHER): Payer: Self-pay

## 2012-09-09 ENCOUNTER — Encounter: Payer: Self-pay | Admitting: Oncology

## 2012-09-09 ENCOUNTER — Telehealth: Payer: Self-pay | Admitting: Medical Oncology

## 2012-09-09 VITALS — BP 127/80 | HR 88 | Temp 97.8°F | Resp 18

## 2012-09-09 DIAGNOSIS — C9500 Acute leukemia of unspecified cell type not having achieved remission: Secondary | ICD-10-CM

## 2012-09-09 LAB — CBC WITH DIFFERENTIAL/PLATELET
BASO%: 0.2 % (ref 0.0–2.0)
EOS%: 0.4 % (ref 0.0–7.0)
MCH: 32.6 pg (ref 27.2–33.4)
MCHC: 35.4 g/dL (ref 32.0–36.0)
MCV: 92.1 fL (ref 79.3–98.0)
MONO%: 2.1 % (ref 0.0–14.0)
NEUT#: 7.1 10*3/uL — ABNORMAL HIGH (ref 1.5–6.5)
RBC: 2.73 10*6/uL — ABNORMAL LOW (ref 4.20–5.82)
RDW: 17.6 % — ABNORMAL HIGH (ref 11.0–14.6)

## 2012-09-09 LAB — HOLD TUBE, BLOOD BANK

## 2012-09-09 MED ORDER — DIPHENHYDRAMINE HCL 25 MG PO CAPS
25.0000 mg | ORAL_CAPSULE | Freq: Once | ORAL | Status: AC
  Administered 2012-09-09: 25 mg via ORAL

## 2012-09-09 MED ORDER — ACETAMINOPHEN 325 MG PO TABS
650.0000 mg | ORAL_TABLET | Freq: Once | ORAL | Status: AC
  Administered 2012-09-09: 650 mg via ORAL

## 2012-09-09 MED ORDER — SODIUM CHLORIDE 0.9 % IV SOLN
250.0000 mL | Freq: Once | INTRAVENOUS | Status: AC
  Administered 2012-09-09: 250 mL via INTRAVENOUS

## 2012-09-09 NOTE — Telephone Encounter (Signed)
I called [facility] spoke with Victorino Dike to inform them that pt's Platelet count is 17 and Hgb 8.9. Pt will need platelets and 2 units of blood. Pt is scheduled today. I faxed results to [phone].

## 2012-09-09 NOTE — Progress Notes (Signed)
This patient received 2 units of packed red cells and one bag of pheresed platelets. All of his blood products are irradiated. Premeds consisted of Tylenol 650 mg by mouth and Benadryl 25 mg by mouth.  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  Hemoglobin today was 8.9, hematocrit 25.1, platelet count 18,000 and white count 7.9 with an ANC of 7.1.

## 2012-09-09 NOTE — Patient Instructions (Signed)
Informacin sobre la transfusin de sangre (Blood Transfusion Information) QU ES UNA TRANSFUSIN DE SANGRE? Una transfusin consiste en el reemplazo de todos o algunos de los componentes de la sangre. La sangre est formada por mltiples clulas que cumplen diferentes funciones.  Los glbulos rojos llevan el oxgeno y se usan para reemplazar prdidas sanguneas.  Los glbulos blancos luchan contra las infecciones.  Las plaquetas controlan las hemorragias.  El plasma provee factores coagulantes.  Existen otros productos disponibles para necesidades especiales como la hemofilia u otros problemas de coagulacin. ANTES DE LA TRANSFUSIN Quin puede donar sangre para transfusiones?   Usted puede donar sangre para ser utilizada ms tarde en usted mismo ( esto se denomina donacin autloga).  Se puede solicitar a los familiares que donen sangre. Esto, generalmente, no es ms seguro que si recibe sangre de un extrao.  Es segura la sangre de voluntarios sanos que hayan sido completamente evaluados. Esto es un banco de sangre. Una transfusin de sangre es ahora ms segura que nunca antes en la prctica de la medicina. Antes de obtener sangre de un donante, se le confecciona una historia completa para asegurarse de que esa persona no tenga antecedentes de enfermedades, ni est involucrado en conductas de riesgo (por ejemplo el uso de drogas intravenosas o actividad sexual con mltiples parejas). Se controlar el historial de viajes del donante para minimizar los riesgos de la transmisin de infecciones como la malaria. La sangre donada se analizar para descartar enfermedades infecciosas como VIH y hepatitis. Luego se probar para asegurarse de que es compatible con usted para minimizar las posibilidades de una reaccin a la transfusin. Si usted o un familiar suyo donan sangre, sto se realiza con anterioridad a una ciruga y no es adecuado en situaciones de emergencia. El proceso de donacin toma  varios das. RIESGOS Y COMPLICACIONES Aunque la terapia de transfusin es muy segura y salva muchas vidas, sus principales peligros incluyen:   El contacto con enfermedades infecciosas.  El desarrollo de una reaccin. Se trata de una reaccin alrgica a algn compuesto de la sangre que se recibi. Para prevenir esta situacin, se toman todas las medidas posibles. Estas decisiones son consideradas detenidamente por su mdico antes de la transfusin. No se realiza una transfusin de sangre a menos que los beneficios sean mayores que los riesgos. DESPUS DEL PROCEDIMIENTO  Inmediatamente despus de recibir una transfusin, se sentir mucho mejor y con ms energa. Esto se percibe especialmente si usted estaba muy anmico. Significa que sus glbulos rojos haban disminuido. La transfusin eleva el nivel de los glbulos rojos que son los que transportan oxgeno y esto generalmente hace que la energa aumente.  Los enfermeros que hayan administrado la transfusin lo controlarn cuidadosamente ante cualquier complicacin. INSTRUCCIONES PARA EL CUIDADO DOMICILIARIO No existen instrucciones especiales para despus de una transfusin. Se sentir ms enrgico. Hable con el mdico acerca de cualquier limitacin sobre las actividades respecto de las enfermedades subyacentes que pueden aparecer como un ataque cardaco.  SOLICITE ATENCIN MDICA SI:  Su enfermedad no mejora luego de la transfusin.  Desarrolla enrojecimiento o irritacin en la zona donde se ha colocado la lnea intravenosa. SOLICITE ATENCIN MDICA DE INMEDIATO SI: Aparecen algunos de los siguientes sntomas en las 12 horas siguientes:  Siente escalofros.  Usted tiene una temperatura oral de ms de 102 F (38.9 C) y no puede controlarla con medicamentos.  Dolores de espalda, pecho, o musculares.  Las personas que lo rodean creen que usted no acta correctamente o lo ven confundido.    Le falta la respiracin o presenta dificultades  respiratorias.  Mareos o sufre desmayos.  Desarrolla un sarpullido o urticaria.  Disminuye la cantidad de orina.  La orina se torna de color oscuro o cambia a un color rosado, rojo o marrn. Aparecen algunos de los siguientes sntomas en los 10 das posteriores a la transfusin:  Usted tiene una temperatura oral de ms de 102 F (38.9 C) y no puede controlarla con medicamentos.  Falta de aire.  Debilidad luego de las actividades normales.  Disminucin en la cantidad de orina, u orina con menos frecuencia.  La parte blanca del ojo se vuelve amarilla (Ictericia).  El color de la orina se vuelve rosado, rojo o marrn. Document Released: 07/21/2005 Document Revised: 10/13/2011 ExitCare Patient Information 2013 [employer]Care[employer].  

## 2012-09-09 NOTE — Telephone Encounter (Signed)
Talked to pat gave him appt for lab today

## 2012-09-10 LAB — PREPARE PLATELET PHERESIS

## 2012-09-10 LAB — TYPE AND SCREEN
Antibody Screen: NEGATIVE
Unit division: 0
Unit division: 0

## 2012-09-12 ENCOUNTER — Encounter: Payer: Self-pay | Admitting: Oncology

## 2012-09-12 NOTE — Progress Notes (Signed)
We received a discharge summary from [facility] regarding this patient's admission to the hospital for 08/31/2012 through 09/03/2012.  The patient received Course 3 module C. as follows:  Methotrexate 1000 mg per meter squared IV in normal saline over 3 hours starting after urine pH was greater than 6. Vincristine 2 mg IV in normal saline over 5 minutes on day 2. Methotrexate 25 mg per meter squared by mouth every 6 hours starting 6.5 hours after administration of IV methotrexate for 4 doses. Leucovorin 25 mg per meter squared IV in normal saline over 5-10 minutes starting 30.5 hours after administration of IV methotrexate. Leucovorin 10 mg by mouth every 6 hours starting 42.5 hours after methotrexate administration.  The patient underwent a lumbar puncture at bedside with administration of intrathecal methotrexate. Arixtra was held for the LP and restarted afterwards. The patient has been accepted by the company's assistance program and will be supplied with Arixtra.  The patient has a right internal jugular double lumen power port that was used for the administration of chemotherapy.

## 2012-09-13 ENCOUNTER — Telehealth: Payer: Self-pay | Admitting: *Deleted

## 2012-09-13 ENCOUNTER — Telehealth: Payer: Self-pay | Admitting: Oncology

## 2012-09-13 ENCOUNTER — Other Ambulatory Visit: Payer: Self-pay | Admitting: Medical Oncology

## 2012-09-13 ENCOUNTER — Other Ambulatory Visit (HOSPITAL_BASED_OUTPATIENT_CLINIC_OR_DEPARTMENT_OTHER): Payer: No Typology Code available for payment source

## 2012-09-13 DIAGNOSIS — C9500 Acute leukemia of unspecified cell type not having achieved remission: Secondary | ICD-10-CM

## 2012-09-13 LAB — CBC WITH DIFFERENTIAL/PLATELET
BASO%: 0.2 % (ref 0.0–2.0)
Eosinophils Absolute: 0 10*3/uL (ref 0.0–0.5)
MCHC: 35.1 g/dL (ref 32.0–36.0)
MCV: 90.6 fL (ref 79.3–98.0)
MONO%: 2.3 % (ref 0.0–14.0)
NEUT#: 4.9 10*3/uL (ref 1.5–6.5)
RBC: 3.43 10*6/uL — ABNORMAL LOW (ref 4.20–5.82)
RDW: 16.8 % — ABNORMAL HIGH (ref 11.0–14.6)
WBC: 5.5 10*3/uL (ref 4.0–10.3)

## 2012-09-13 LAB — HOLD TUBE, BLOOD BANK

## 2012-09-13 NOTE — Telephone Encounter (Signed)
s.w pt and advised him on the 10:00am appt...pt ok

## 2012-09-13 NOTE — Telephone Encounter (Signed)
S/w the pt and he is aware of his lab and blood/platelet appts on 09/16/2012@9 :45am. S/w luwanna from clinical social work and she is aware to set up the interpreter for this visit.

## 2012-09-13 NOTE — Telephone Encounter (Signed)
Per staff message and POF I have scheduled appts.  JMW  

## 2012-09-16 ENCOUNTER — Other Ambulatory Visit (HOSPITAL_BASED_OUTPATIENT_CLINIC_OR_DEPARTMENT_OTHER): Payer: No Typology Code available for payment source | Admitting: Lab

## 2012-09-16 ENCOUNTER — Ambulatory Visit: Payer: Self-pay

## 2012-09-16 ENCOUNTER — Telehealth: Payer: Self-pay

## 2012-09-16 DIAGNOSIS — C9500 Acute leukemia of unspecified cell type not having achieved remission: Secondary | ICD-10-CM

## 2012-09-16 LAB — COMPREHENSIVE METABOLIC PANEL (CC13)
ALT: 89 U/L — ABNORMAL HIGH (ref 0–55)
AST: 45 U/L — ABNORMAL HIGH (ref 5–34)
Alkaline Phosphatase: 128 U/L (ref 40–150)
Creatinine: 0.8 mg/dL (ref 0.7–1.3)
Total Bilirubin: 0.75 mg/dL (ref 0.20–1.20)

## 2012-09-16 LAB — CBC WITH DIFFERENTIAL/PLATELET
BASO%: 0 % (ref 0.0–2.0)
EOS%: 1.3 % (ref 0.0–7.0)
HCT: 30.4 % — ABNORMAL LOW (ref 38.4–49.9)
MCHC: 34.9 g/dL (ref 32.0–36.0)
MONO#: 0.1 10*3/uL (ref 0.1–0.9)
NEUT%: 73.4 % (ref 39.0–75.0)
RBC: 3.38 10*6/uL — ABNORMAL LOW (ref 4.20–5.82)
RDW: 15.7 % — ABNORMAL HIGH (ref 11.0–14.6)
WBC: 2.3 10*3/uL — ABNORMAL LOW (ref 4.0–10.3)
lymph#: 0.5 10*3/uL — ABNORMAL LOW (ref 0.9–3.3)

## 2012-09-16 LAB — HOLD TUBE, BLOOD BANK

## 2012-09-16 NOTE — Progress Notes (Signed)
Hgb 10.6, Plts 47 No treatment today. Spoke with patient in lobby and given copy of labs.

## 2012-09-16 NOTE — Telephone Encounter (Signed)
Faxed CBC/diff, CMP to Dr Lowell Guitar

## 2012-09-22 ENCOUNTER — Telehealth: Payer: Self-pay | Admitting: Medical Oncology

## 2012-09-22 ENCOUNTER — Other Ambulatory Visit: Payer: Self-pay | Admitting: Medical Oncology

## 2012-09-22 DIAGNOSIS — C9500 Acute leukemia of unspecified cell type not having achieved remission: Secondary | ICD-10-CM

## 2012-09-22 NOTE — Telephone Encounter (Signed)
Cleophas Dunker from [facility] called stating that pt will be discharged tomorrow. He will need labs 09/27/12 and 09/30/12. She asked if we can call her the time she will be happy to relay to pt.

## 2012-09-22 NOTE — Telephone Encounter (Signed)
I called Cleophas Dunker at [facility] and gave her the times of pt's appointments on 2/24 and 2/27. She will relay to the pt.

## 2012-09-27 ENCOUNTER — Other Ambulatory Visit (HOSPITAL_BASED_OUTPATIENT_CLINIC_OR_DEPARTMENT_OTHER): Payer: No Typology Code available for payment source

## 2012-09-27 ENCOUNTER — Telehealth: Payer: Self-pay | Admitting: Medical Oncology

## 2012-09-27 DIAGNOSIS — C9500 Acute leukemia of unspecified cell type not having achieved remission: Secondary | ICD-10-CM

## 2012-09-27 LAB — CBC WITH DIFFERENTIAL/PLATELET
BASO%: 0.2 % (ref 0.0–2.0)
EOS%: 3.3 % (ref 0.0–7.0)
HCT: 27.2 % — ABNORMAL LOW (ref 38.4–49.9)
LYMPH%: 35.2 % (ref 14.0–49.0)
MCH: 32.4 pg (ref 27.2–33.4)
MCHC: 36.2 g/dL — ABNORMAL HIGH (ref 32.0–36.0)
MONO#: 0 10*3/uL — ABNORMAL LOW (ref 0.1–0.9)
NEUT%: 57.3 % (ref 39.0–75.0)
Platelets: 119 10*3/uL — ABNORMAL LOW (ref 140–400)

## 2012-09-27 LAB — COMPREHENSIVE METABOLIC PANEL (CC13)
ALT: 235 U/L — ABNORMAL HIGH (ref 0–55)
CO2: 26 mEq/L (ref 22–29)
Creatinine: 0.8 mg/dL (ref 0.7–1.3)
Total Bilirubin: 0.98 mg/dL (ref 0.20–1.20)

## 2012-09-27 LAB — MAGNESIUM (CC13): Magnesium: 2.2 mg/dl (ref 1.5–2.5)

## 2012-09-27 NOTE — Telephone Encounter (Signed)
I called critical lab values and labs faxed to Dr. Lowell Guitar to nurse ar [facility]

## 2012-09-30 ENCOUNTER — Other Ambulatory Visit (HOSPITAL_BASED_OUTPATIENT_CLINIC_OR_DEPARTMENT_OTHER): Payer: No Typology Code available for payment source | Admitting: Lab

## 2012-09-30 ENCOUNTER — Other Ambulatory Visit: Payer: Self-pay | Admitting: Lab

## 2012-09-30 ENCOUNTER — Other Ambulatory Visit: Payer: Self-pay

## 2012-09-30 ENCOUNTER — Ambulatory Visit (HOSPITAL_BASED_OUTPATIENT_CLINIC_OR_DEPARTMENT_OTHER): Payer: No Typology Code available for payment source

## 2012-09-30 ENCOUNTER — Telehealth: Payer: Self-pay

## 2012-09-30 VITALS — BP 127/62 | HR 73 | Temp 98.3°F | Resp 20

## 2012-09-30 DIAGNOSIS — C9200 Acute myeloblastic leukemia, not having achieved remission: Secondary | ICD-10-CM

## 2012-09-30 DIAGNOSIS — C9500 Acute leukemia of unspecified cell type not having achieved remission: Secondary | ICD-10-CM

## 2012-09-30 LAB — CBC WITH DIFFERENTIAL/PLATELET
Basophils Absolute: 0 10*3/uL (ref 0.0–0.1)
EOS%: 0.8 % (ref 0.0–7.0)
Eosinophils Absolute: 0 10*3/uL (ref 0.0–0.5)
HCT: 25.3 % — ABNORMAL LOW (ref 38.4–49.9)
HGB: 8.8 g/dL — ABNORMAL LOW (ref 13.0–17.1)
MCH: 30.7 pg (ref 27.2–33.4)
MONO#: 0.2 10*3/uL (ref 0.1–0.9)
NEUT%: 72 % (ref 39.0–75.0)
lymph#: 0.4 10*3/uL — ABNORMAL LOW (ref 0.9–3.3)

## 2012-09-30 LAB — HOLD TUBE, BLOOD BANK

## 2012-09-30 MED ORDER — DIPHENHYDRAMINE HCL 25 MG PO CAPS
25.0000 mg | ORAL_CAPSULE | Freq: Once | ORAL | Status: AC
  Administered 2012-09-30: 25 mg via ORAL

## 2012-09-30 MED ORDER — SODIUM CHLORIDE 0.9 % IJ SOLN
10.0000 mL | INTRAMUSCULAR | Status: AC | PRN
  Administered 2012-09-30: 10 mL
  Filled 2012-09-30: qty 10

## 2012-09-30 MED ORDER — HEPARIN SOD (PORK) LOCK FLUSH 100 UNIT/ML IV SOLN
500.0000 [IU] | Freq: Every day | INTRAVENOUS | Status: AC | PRN
  Administered 2012-09-30: 500 [IU]
  Filled 2012-09-30: qty 5

## 2012-09-30 MED ORDER — ACETAMINOPHEN 325 MG PO TABS
650.0000 mg | ORAL_TABLET | Freq: Once | ORAL | Status: AC
  Administered 2012-09-30: 650 mg via ORAL

## 2012-09-30 MED ORDER — SODIUM CHLORIDE 0.9 % IV SOLN
250.0000 mL | Freq: Once | INTRAVENOUS | Status: AC
  Administered 2012-09-30: 250 mL via INTRAVENOUS

## 2012-09-30 NOTE — Telephone Encounter (Signed)
Faxed cbc from 2/27 to [facility]

## 2012-09-30 NOTE — Patient Instructions (Signed)
Informacin sobre la transfusin de sangre (Blood Transfusion Information) QU ES UNA TRANSFUSIN DE SANGRE? Una transfusin consiste en el reemplazo de todos o algunos de los componentes de la sangre. La sangre est formada por mltiples clulas que cumplen diferentes funciones.  Los glbulos rojos llevan el oxgeno y se usan para reemplazar prdidas sanguneas.  Los glbulos blancos luchan contra las infecciones.  Las plaquetas controlan las hemorragias.  El plasma provee factores coagulantes.  Existen otros productos disponibles para necesidades especiales como la hemofilia u otros problemas de coagulacin. ANTES DE LA TRANSFUSIN Quin puede donar sangre para transfusiones?   Usted puede donar sangre para ser utilizada ms tarde en usted mismo ( esto se denomina donacin autloga).  Se puede solicitar a los familiares que donen sangre. Esto, generalmente, no es ms seguro que si recibe sangre de un extrao.  Es segura la sangre de voluntarios sanos que hayan sido completamente evaluados. Esto es un banco de sangre. Una transfusin de sangre es ahora ms segura que nunca antes en la prctica de la medicina. Antes de obtener sangre de un donante, se le confecciona una historia completa para asegurarse de que esa persona no tenga antecedentes de enfermedades, ni est involucrado en conductas de riesgo (por ejemplo el uso de drogas intravenosas o actividad sexual con mltiples parejas). Se controlar el historial de viajes del donante para minimizar los riesgos de la transmisin de infecciones como la malaria. La sangre donada se analizar para descartar enfermedades infecciosas como VIH y hepatitis. Luego se probar para asegurarse de que es compatible con usted para minimizar las posibilidades de una reaccin a la transfusin. Si usted o un familiar suyo donan sangre, sto se realiza con anterioridad a una ciruga y no es adecuado en situaciones de emergencia. El proceso de donacin toma  varios das. RIESGOS Y COMPLICACIONES Aunque la terapia de transfusin es muy segura y salva muchas vidas, sus principales peligros incluyen:   El contacto con enfermedades infecciosas.  El desarrollo de una reaccin. Se trata de una reaccin alrgica a algn compuesto de la sangre que se recibi. Para prevenir esta situacin, se toman todas las medidas posibles. Estas decisiones son consideradas detenidamente por su mdico antes de la transfusin. No se realiza una transfusin de sangre a menos que los beneficios sean mayores que los riesgos. DESPUS DEL PROCEDIMIENTO  Inmediatamente despus de recibir una transfusin, se sentir mucho mejor y con ms energa. Esto se percibe especialmente si usted estaba muy anmico. Significa que sus glbulos rojos haban disminuido. La transfusin eleva el nivel de los glbulos rojos que son los que transportan oxgeno y esto generalmente hace que la energa aumente.  Los enfermeros que hayan administrado la transfusin lo controlarn cuidadosamente ante cualquier complicacin. INSTRUCCIONES PARA EL CUIDADO DOMICILIARIO No existen instrucciones especiales para despus de una transfusin. Se sentir ms enrgico. Hable con el mdico acerca de cualquier limitacin sobre las actividades respecto de las enfermedades subyacentes que pueden aparecer como un ataque cardaco.  SOLICITE ATENCIN MDICA SI:  Su enfermedad no mejora luego de la transfusin.  Desarrolla enrojecimiento o irritacin en la zona donde se ha colocado la lnea intravenosa. SOLICITE ATENCIN MDICA DE INMEDIATO SI: Aparecen algunos de los siguientes sntomas en las 12 horas siguientes:  Siente escalofros.  Usted tiene una temperatura oral de ms de 102 F (38.9 C) y no puede controlarla con medicamentos.  Dolores de espalda, pecho, o musculares.  Las personas que lo rodean creen que usted no acta correctamente o lo ven confundido.    Le falta la respiracin o presenta dificultades  respiratorias.  Mareos o sufre desmayos.  Desarrolla un sarpullido o urticaria.  Disminuye la cantidad de orina.  La orina se torna de color oscuro o cambia a un color rosado, rojo o marrn. Aparecen algunos de los siguientes sntomas en los 10 das posteriores a la transfusin:  Usted tiene una temperatura oral de ms de 102 F (38.9 C) y no puede controlarla con medicamentos.  Falta de aire.  Debilidad luego de las actividades normales.  Disminucin en la cantidad de orina, u orina con menos frecuencia.  La parte blanca del ojo se vuelve amarilla (Ictericia).  El color de la orina se vuelve rosado, rojo o marrn. Document Released: 07/21/2005 Document Revised: 10/13/2011 ExitCare Patient Information 2013 [employer]Care[employer].  

## 2012-10-01 LAB — TYPE AND SCREEN
ABO/RH(D): O POS
Unit division: 0

## 2012-10-05 ENCOUNTER — Other Ambulatory Visit: Payer: Self-pay | Admitting: Medical Oncology

## 2012-10-05 DIAGNOSIS — C9500 Acute leukemia of unspecified cell type not having achieved remission: Secondary | ICD-10-CM

## 2012-10-06 ENCOUNTER — Telehealth: Payer: Self-pay | Admitting: Medical Oncology

## 2012-10-06 ENCOUNTER — Telehealth: Payer: Self-pay | Admitting: Oncology

## 2012-10-06 NOTE — Telephone Encounter (Signed)
Added appts for 3/10, 3/13, 3/17, 3/20 and 3/24. Per desk nurse she will contact nurse @ [facility] to give pt appts. Also per desk nurse 1st appt should be 3/10 not 3/16.

## 2012-10-06 NOTE — Telephone Encounter (Signed)
I called Wayne Grimes-nurse at [facility] to let her know that pt will need to be here 10/11/12 at 830 am for labs. She will give pt this appointment time.

## 2012-10-07 ENCOUNTER — Encounter (HOSPITAL_COMMUNITY)
Admission: RE | Admit: 2012-10-07 | Discharge: 2012-10-07 | Disposition: A | Discharge status: Home / Self Care | Payer: No Typology Code available for payment source | Source: Ambulatory Visit | Attending: Oncology | Admitting: Oncology

## 2012-10-07 DIAGNOSIS — C9500 Acute leukemia of unspecified cell type not having achieved remission: Secondary | ICD-10-CM | POA: Insufficient documentation

## 2012-10-07 DIAGNOSIS — C9200 Acute myeloblastic leukemia, not having achieved remission: Secondary | ICD-10-CM | POA: Insufficient documentation

## 2012-10-11 ENCOUNTER — Other Ambulatory Visit: Payer: Self-pay | Admitting: Oncology

## 2012-10-11 ENCOUNTER — Other Ambulatory Visit: Payer: No Typology Code available for payment source

## 2012-10-11 ENCOUNTER — Encounter: Payer: Self-pay | Admitting: Medical Oncology

## 2012-10-11 ENCOUNTER — Ambulatory Visit: Payer: No Typology Code available for payment source

## 2012-10-11 ENCOUNTER — Other Ambulatory Visit: Payer: Self-pay | Admitting: Medical Oncology

## 2012-10-11 DIAGNOSIS — C9500 Acute leukemia of unspecified cell type not having achieved remission: Secondary | ICD-10-CM

## 2012-10-11 LAB — COMPREHENSIVE METABOLIC PANEL (CC13)
BUN: 10.9 mg/dL (ref 7.0–26.0)
CO2: 23 mEq/L (ref 22–29)
Calcium: 8.7 mg/dL (ref 8.4–10.4)
Chloride: 107 mEq/L (ref 98–107)
Creatinine: 0.8 mg/dL (ref 0.7–1.3)
Total Bilirubin: 0.46 mg/dL (ref 0.20–1.20)

## 2012-10-11 LAB — CBC WITH DIFFERENTIAL/PLATELET
Basophils Absolute: 0 10*3/uL (ref 0.0–0.1)
EOS%: 1.6 % (ref 0.0–7.0)
HCT: 31.8 % — ABNORMAL LOW (ref 38.4–49.9)
HGB: 11.6 g/dL — ABNORMAL LOW (ref 13.0–17.1)
LYMPH%: 37.1 % (ref 14.0–49.0)
MCH: 32.5 pg (ref 27.2–33.4)
MCHC: 36.3 g/dL — ABNORMAL HIGH (ref 32.0–36.0)
MONO#: 0.2 10*3/uL (ref 0.1–0.9)
NEUT%: 49.1 % (ref 39.0–75.0)
Platelets: 223 10*3/uL (ref 140–400)
lymph#: 0.5 10*3/uL — ABNORMAL LOW (ref 0.9–3.3)

## 2012-10-11 LAB — MAGNESIUM (CC13): Magnesium: 2.1 mg/dl (ref 1.5–2.5)

## 2012-10-11 NOTE — Progress Notes (Signed)
Labs reviewed by Marlowe Kays, PA HGB 11.6, no transfusion needed. Notified pt in lobby.

## 2012-10-11 NOTE — Progress Notes (Signed)
Labs from 10/11/12 faxed to [facility] [phone]

## 2012-10-14 ENCOUNTER — Other Ambulatory Visit (HOSPITAL_BASED_OUTPATIENT_CLINIC_OR_DEPARTMENT_OTHER): Payer: No Typology Code available for payment source

## 2012-10-14 ENCOUNTER — Ambulatory Visit: Payer: No Typology Code available for payment source

## 2012-10-14 ENCOUNTER — Encounter: Payer: Self-pay | Admitting: Medical Oncology

## 2012-10-14 DIAGNOSIS — C9500 Acute leukemia of unspecified cell type not having achieved remission: Secondary | ICD-10-CM

## 2012-10-14 LAB — CBC WITH DIFFERENTIAL/PLATELET
Basophils Absolute: 0 10*3/uL (ref 0.0–0.1)
Eosinophils Absolute: 0.1 10*3/uL (ref 0.0–0.5)
HGB: 11.5 g/dL — ABNORMAL LOW (ref 13.0–17.1)
MONO#: 0.4 10*3/uL (ref 0.1–0.9)
NEUT#: 3.2 10*3/uL (ref 1.5–6.5)
RBC: 3.57 10*6/uL — ABNORMAL LOW (ref 4.20–5.82)
RDW: 16.8 % — ABNORMAL HIGH (ref 11.0–14.6)
WBC: 4.4 10*3/uL (ref 4.0–10.3)
lymph#: 0.6 10*3/uL — ABNORMAL LOW (ref 0.9–3.3)

## 2012-10-14 LAB — HOLD TUBE, BLOOD BANK

## 2012-10-14 NOTE — Progress Notes (Signed)
Per Zella Ball, RN pt does not need transfusion.  Robin notified patient.

## 2012-10-14 NOTE — Progress Notes (Signed)
Labs from today faxed to Dr.Powell at [facility]

## 2012-10-17 ENCOUNTER — Encounter: Payer: Self-pay | Admitting: Oncology

## 2012-10-17 NOTE — Progress Notes (Signed)
We received a discharge summary from [facility] for the admission from 09/21/2012 through 09/24/2012. Patient was admitted for scheduled chemotherapy for course 3 module C day 15 according to CALGB 16109.  He received the following chemotherapy following hydration and premedication:  Methotrexate 1000 mg per meter squared IV in normal saline over 3 hours starting after urine pH was greater than 6.  Vincristine 2 mg IV in normal saline over 5 minutes on day 2.  Leucovorin 25 mg per meter squared IV in normal saline over 5-10 minutes starting 30.5 hours after administration of IV methotrexate.  Leucovorin 10 mg by mouth every 6 hours starting 42.5 hours after methotrexate administration.  Oral methotrexate was held due to neutropenia.  On 09/23/2012 under fluoroscopy, a lumbar puncture was carried out with the administration of IT methotrexate. Hydrocortisone was withheld due to nausea and vomiting after the last LP cytology was negative.. The patient did not experience any nausea or vomiting after this LP.    We received a discharge summary from [facility] for the admission of 10/05/2012 through 10/07/2012. The patient was admitted for scheduled chemotherapy with course 3 module C day 29 as per CALGB 60454.  Patient received the following chemotherapy:  Methotrexate 1000 mg per meter squared IV in normal saline over 3 hours starting after urine pH was greater than 6.  Vincristine 2 mg IV in normal saline over 5 minutes on day 2.  Methotrexate 25 mg per meter squared by mouth every 6 hours starting 6.5 hours after administration of IV methotrexate for 4 doses.  Leucovorin 25 mg per meter squared IV in normal saline over 5-10 minutes starting 30.5 hours after administration of IV methotrexate.  Leucovorin 10 mg by mouth every 6 hours starting 42.5 hours after methotrexate administration. IT methotrexate 15 mg in 10 cc of normal saline.   IT  methotrexate was administered under fluoroscopy on 10/06/2012. Cytology was pending at the time of the discharge summary. Arixtra was held upon admission for the LP and resumed after the LP.  The patient has a right chest double lumen power port which is being used for his chemotherapy and blood transfusions.

## 2012-10-18 ENCOUNTER — Other Ambulatory Visit (HOSPITAL_BASED_OUTPATIENT_CLINIC_OR_DEPARTMENT_OTHER): Payer: No Typology Code available for payment source | Admitting: Lab

## 2012-10-18 DIAGNOSIS — C9500 Acute leukemia of unspecified cell type not having achieved remission: Secondary | ICD-10-CM

## 2012-10-18 LAB — COMPREHENSIVE METABOLIC PANEL (CC13)
AST: 35 U/L — ABNORMAL HIGH (ref 5–34)
Albumin: 3.7 g/dL (ref 3.5–5.0)
BUN: 7.1 mg/dL (ref 7.0–26.0)
CO2: 23 mEq/L (ref 22–29)
Calcium: 9.1 mg/dL (ref 8.4–10.4)
Chloride: 109 mEq/L — ABNORMAL HIGH (ref 98–107)
Potassium: 3.7 mEq/L (ref 3.5–5.1)

## 2012-10-18 LAB — CBC WITH DIFFERENTIAL/PLATELET
BASO%: 0.4 % (ref 0.0–2.0)
Basophils Absolute: 0 10*3/uL (ref 0.0–0.1)
Eosinophils Absolute: 0.1 10*3/uL (ref 0.0–0.5)
HCT: 33.9 % — ABNORMAL LOW (ref 38.4–49.9)
HGB: 11.8 g/dL — ABNORMAL LOW (ref 13.0–17.1)
MCHC: 34.9 g/dL (ref 32.0–36.0)
MONO#: 0.5 10*3/uL (ref 0.1–0.9)
NEUT#: 2.6 10*3/uL (ref 1.5–6.5)
NEUT%: 71.7 % (ref 39.0–75.0)
WBC: 3.6 10*3/uL — ABNORMAL LOW (ref 4.0–10.3)
lymph#: 0.5 10*3/uL — ABNORMAL LOW (ref 0.9–3.3)

## 2012-10-21 ENCOUNTER — Telehealth: Payer: Self-pay

## 2012-10-21 ENCOUNTER — Other Ambulatory Visit: Payer: Self-pay | Admitting: Oncology

## 2012-10-21 ENCOUNTER — Other Ambulatory Visit: Payer: No Typology Code available for payment source

## 2012-10-21 NOTE — Telephone Encounter (Signed)
S/w person who interpreted for pt - he cancelled todays cbc test. I then called [facility] and spoke with Meriam Sprague RN who said that [facility] cancelled labs for 3/20. There was no note about cancelling labs on 3/24. Pt is schedule for course #4 at [facility] for 3/25.

## 2012-10-25 ENCOUNTER — Telehealth: Payer: Self-pay | Admitting: Oncology

## 2012-10-25 ENCOUNTER — Other Ambulatory Visit: Payer: No Typology Code available for payment source

## 2012-10-25 NOTE — Telephone Encounter (Signed)
Pt lmonvm to cx 3/24 appt. Per pt he is out of town and there is just no way he will make it back in time.

## 2012-10-25 NOTE — Telephone Encounter (Signed)
Added pt previous. Message left for desk nurse re pt cancelling appts for today.

## 2012-10-26 ENCOUNTER — Other Ambulatory Visit: Payer: Self-pay | Admitting: Medical Oncology

## 2012-10-26 DIAGNOSIS — C9500 Acute leukemia of unspecified cell type not having achieved remission: Secondary | ICD-10-CM

## 2012-10-27 ENCOUNTER — Telehealth: Payer: Self-pay | Admitting: *Deleted

## 2012-10-27 NOTE — Telephone Encounter (Signed)
Per staff phone call and POF I have schedueld appts.  JMW  

## 2012-10-27 NOTE — Telephone Encounter (Signed)
sw pt gv appts for lab on 4/4, 4/11, and 4/18. Pt is aware that according to his lab results he may need a blood transfusion.

## 2012-11-02 ENCOUNTER — Encounter (HOSPITAL_COMMUNITY)
Admission: RE | Admit: 2012-11-02 | Discharge: 2012-11-02 | Disposition: A | Discharge status: Home / Self Care | Payer: No Typology Code available for payment source | Source: Ambulatory Visit | Attending: Oncology | Admitting: Oncology

## 2012-11-02 DIAGNOSIS — C9500 Acute leukemia of unspecified cell type not having achieved remission: Secondary | ICD-10-CM | POA: Insufficient documentation

## 2012-11-02 DIAGNOSIS — C9200 Acute myeloblastic leukemia, not having achieved remission: Secondary | ICD-10-CM | POA: Insufficient documentation

## 2012-11-05 ENCOUNTER — Other Ambulatory Visit: Payer: Self-pay | Admitting: Medical Oncology

## 2012-11-05 ENCOUNTER — Other Ambulatory Visit (HOSPITAL_BASED_OUTPATIENT_CLINIC_OR_DEPARTMENT_OTHER): Payer: No Typology Code available for payment source

## 2012-11-05 ENCOUNTER — Ambulatory Visit (HOSPITAL_BASED_OUTPATIENT_CLINIC_OR_DEPARTMENT_OTHER): Payer: No Typology Code available for payment source

## 2012-11-05 VITALS — BP 125/75 | HR 114 | Temp 98.0°F | Resp 20

## 2012-11-05 DIAGNOSIS — C9500 Acute leukemia of unspecified cell type not having achieved remission: Secondary | ICD-10-CM

## 2012-11-05 LAB — CBC WITH DIFFERENTIAL/PLATELET
MCHC: 34.5 g/dL (ref 32.0–36.0)
Platelets: 8 10*3/uL — CL (ref 140–400)
RBC: 4 10*6/uL — ABNORMAL LOW (ref 4.20–5.82)
RDW: 14.3 % (ref 11.0–14.6)
WBC: <0.2 10*3/uL — CL (ref 4.0–10.3)

## 2012-11-05 LAB — COMPREHENSIVE METABOLIC PANEL (CC13)
AST: 15 U/L (ref 5–34)
Albumin: 2.8 g/dL — ABNORMAL LOW (ref 3.5–5.0)
BUN: 16.2 mg/dL (ref 7.0–26.0)
CO2: 25 mEq/L (ref 22–29)
Calcium: 8.5 mg/dL (ref 8.4–10.4)
Chloride: 100 mEq/L (ref 98–107)
Creatinine: 1 mg/dL (ref 0.7–1.3)
Potassium: 4.1 mEq/L (ref 3.5–5.1)

## 2012-11-05 LAB — MAGNESIUM (CC13): Magnesium: 2.2 mg/dl (ref 1.5–2.5)

## 2012-11-05 LAB — TECHNOLOGIST REVIEW

## 2012-11-05 MED ORDER — DIPHENHYDRAMINE HCL 25 MG PO CAPS
25.0000 mg | ORAL_CAPSULE | Freq: Once | ORAL | Status: AC
  Administered 2012-11-05: 25 mg via ORAL

## 2012-11-05 MED ORDER — SODIUM CHLORIDE 0.9 % IV SOLN
250.0000 mL | Freq: Once | INTRAVENOUS | Status: AC
  Administered 2012-11-05: 250 mL via INTRAVENOUS

## 2012-11-05 MED ORDER — SODIUM CHLORIDE 0.9 % IJ SOLN
10.0000 mL | INTRAMUSCULAR | Status: AC | PRN
  Administered 2012-11-05: 10 mL
  Filled 2012-11-05: qty 10

## 2012-11-05 MED ORDER — HEPARIN SOD (PORK) LOCK FLUSH 100 UNIT/ML IV SOLN
500.0000 [IU] | Freq: Every day | INTRAVENOUS | Status: AC | PRN
  Administered 2012-11-05: 500 [IU]
  Filled 2012-11-05: qty 5

## 2012-11-05 MED ORDER — ACETAMINOPHEN 325 MG PO TABS
650.0000 mg | ORAL_TABLET | Freq: Once | ORAL | Status: AC
  Administered 2012-11-05: 650 mg via ORAL

## 2012-11-05 NOTE — Patient Instructions (Signed)
Informacin sobre la transfusin de plaquetas (Platelet Transfusion Information) Esta es informacin acerca de la transfusin de plaquetas. Las plaquetas son pequeas clulas producidas en la mdula sea y que se encuentran en la sangre. Cuando un vaso sanguneo se daa, las plaquetas fluyen hacia la zona lesionada para formar un cogulo. Este procedimiento hace comenzar el proceso de curacin. Cuando el nivel de plaquetas es muy bajo en su sangre, podr tener problemas de coagulacin. Esto puede deberse a:  Public relations account executive.  Quimioterapia para tratar Science writer. A menudo, muchas veces los niveles bajos de plaquetas no causan problemas.  Las plaquetas pueden durar entre 7 y 35 das. Si no se utilizan en una lesin el bazo o el hgado las rompen. Los sntomas de un bajo recuento de plaquetas incluyen:  Hemorragias nasales.  Encas que sangran.  Abundante sangrado menstrual.  Hematomas y pequeos puntos rojos en la piel.  Los pequeos puntos de la piel se denominan petequias.  Grandes hematomas(prpura).  La hemorragia puede ser ms seria si sucede en el cerebro o el intestino. Las transfusiones de plaquetas a menudo se utilizan para [employer] aceptable. Es poco comn que se produzca una hemorragia grave debido a la baja cantidad de plaquetas. RIESGOS Y COMPLICACIONES  No son frecuentes los efectos secundarios graves de [place]. Las reacciones menores pueden incluir:  Picazn  Erupciones  Temperaturas alta y escalofros. Existen medicamentos disponibles para [employer] a las transfusiones. Comente con el profesional que lo asiste si tiene alguno de [employer].  Si recibe transfusiones de plaquetas de manera frecuente pueden ser menos efectivas. Esto se denomina ser refractario a la plaquetas. Esto no es frecuente. Puede suceder por causas inmunes y no inmunes. Las causas no inmunes incluyen:  Fiebre alta.  Algunos  medicamentos.  Bazo agrandado. Las causas inmunes se producen cuando su organismo detecta que las plaquetas no le son propias y fabrican anticuerpos en contra de ellas. [place] plaquetas rpidamente. An con la transfusin de plaquetas usted podra continuar con problemas de sangrado o hematomas. Comunquele a sus mdicos si esto le sucede. Hay otras medidas para tomar si esto le sucede. ANTES DEL [place] controlar su nivel de plaquetas de forma regular.  Si el nivel es muy bajo ser necesario realizarle una transfusin.  Esto es ms importante antes de ciertos procedimiento con riesgo de hemorragia como una puncin espinal.  La transfusin de plaquetas reduce el riesgo de hemorragias durante o despus del procedimiento.  Excepto durante emergencias, la transfusin requiere un consentimiento escrito. Antes de extraer sangre de un donante, se tomar un historial completo para asegurarse que la persona no tiene antecedentes de enfermedades previas y no est presenta conductas sociales de [place]. Son ejemplo de estas ltimas el consumo de drogas por va intravenosa o la actividad sexual con mltiples parejas. Esto podra llevar a que fueran utilizados tanto sangre como sus derivados estando infectados. Esto se realiza incluso adems del exhaustivo control para asegurarse de que la sangre es segura. Todos los productos sanguneos que se transfunden se estudian para asegurarse de que son compatibles con la persona que la recibe. Tambin se controla para descartar la presencia de infecciones. Actualmente la sangre de transfusin es ms segura que nunca antes. El riesgo de infeccin es [place]. [place] plaquetas se almacenan en pequeas bolsas plsticas que se guardan a bajas temperaturas.  Cada bolsa se denomina unidad y a Clinical cytogeneticist se administran dos. Se  administran a travs de una gua intravenosa por goteo durante aproximadamente media hora.  Por lo  general la sangre se obtiene de muchas personas para tener la cantidad suficiente para transfundir.  A veces, las plaquetas se obtienen de Teacher, music. Esto se realiza mediante una mquina especial que separa las plaquetas de la [place]. Esta se denomina mquina de afresis. Las plaquetas obtenidas por esta va se denominan plaquetas por afresis. Las plaquetas por afresis reducen el riesgo de volverse sensible a las plaquetas. Esto disminuye las posibilidades de tener una reaccin a la transfusin.  Como lleva muy poco tiempo donar plaquetas, este tratamiento puede administrarse en un consultorio externo. Las plaquetas tambin pueden administrarse antes o despus de otros tratamientos. SOLICITE ATENCIN MDICA INMEDIATAMENTE SI: Aparecen algunos de los siguientes sntomas en las 12 horas o en los das siguientes:  Siente escalofros.  Sube la fiebre, con una temperatura superior a los 102 F (38.9 C)  Dolores de espalda o [employer] personas que lo rodean creen que usted no Secondary school teacher o lo ven confundido  Sangre al orinar o en la deposicin o hemorragia en cualquier parte del cuerpo.  Le falta la respiracin o presenta dificultades respiratorias.  Mareos.  Desmayos  Desarrolla un sarpullido o urticaria.  Disminuye la cantidad de [place], o sta toma un color oscuro o cambia hacia un tono rosado, rojo o [place].  Dolor de cabeza intenso o rigidez en el cuello.  Se le forman hematomas fcilmente. Document Released: 11/06/2008 Document Revised: 10/13/2011 [facility] Patient Information 2013 [place], [place].

## 2012-11-05 NOTE — Progress Notes (Signed)
Pt instructed to call doctor if he has any fever

## 2012-11-06 ENCOUNTER — Encounter: Payer: Self-pay | Admitting: Oncology

## 2012-11-06 LAB — PREPARE PLATELET PHERESIS

## 2012-11-06 NOTE — Progress Notes (Signed)
This patient had an outpatient office visit at [facility] on 10/29/2012. It was noted that he was Day 5 of Course 4. CBC on that date was essentially normal except for a hemoglobin of 11.8 and a hematocrit of 34.5. Platelets were 228,000 and white count was 7.4.   On 11/05/2012, white count was less than 0.2 and platelet count was 8000. Hemoglobin was 12.3 and hematocrit 35.7.  The patient received a bag of pheresed platelets which I am assuming were also radiated on 11/05/2012. The patient received Tylenol 650 mg by mouth and Benadryl 25 mg by mouth. The transfusion was carried out here at the [facility].

## 2012-11-09 ENCOUNTER — Encounter: Payer: Self-pay | Admitting: Medical Oncology

## 2012-11-12 ENCOUNTER — Ambulatory Visit (HOSPITAL_BASED_OUTPATIENT_CLINIC_OR_DEPARTMENT_OTHER): Payer: No Typology Code available for payment source

## 2012-11-12 ENCOUNTER — Other Ambulatory Visit: Payer: Self-pay

## 2012-11-12 ENCOUNTER — Other Ambulatory Visit (HOSPITAL_BASED_OUTPATIENT_CLINIC_OR_DEPARTMENT_OTHER): Payer: No Typology Code available for payment source | Admitting: Lab

## 2012-11-12 ENCOUNTER — Encounter: Payer: Self-pay | Admitting: Oncology

## 2012-11-12 VITALS — BP 135/76 | HR 94 | Temp 98.6°F | Resp 20

## 2012-11-12 DIAGNOSIS — C9500 Acute leukemia of unspecified cell type not having achieved remission: Secondary | ICD-10-CM

## 2012-11-12 LAB — CBC WITH DIFFERENTIAL/PLATELET
HCT: 29.2 % — ABNORMAL LOW (ref 38.4–49.9)
HGB: 10.2 g/dL — ABNORMAL LOW (ref 13.0–17.1)
MCHC: 34.9 g/dL (ref 32.0–36.0)
Platelets: 13 10*3/uL — ABNORMAL LOW (ref 140–400)

## 2012-11-12 LAB — MAGNESIUM (CC13): Magnesium: 2.2 mg/dl (ref 1.5–2.5)

## 2012-11-12 LAB — COMPREHENSIVE METABOLIC PANEL (CC13)
ALT: 12 U/L (ref 0–55)
AST: 11 U/L (ref 5–34)
Alkaline Phosphatase: 108 U/L (ref 40–150)
CO2: 22 mEq/L (ref 22–29)
Creatinine: 0.9 mg/dL (ref 0.7–1.3)
Total Bilirubin: 0.88 mg/dL (ref 0.20–1.20)

## 2012-11-12 LAB — HOLD TUBE, BLOOD BANK

## 2012-11-12 MED ORDER — HYDROCODONE-ACETAMINOPHEN 5-325 MG PO TABS
1.0000 | ORAL_TABLET | Freq: Once | ORAL | Status: AC
  Administered 2012-11-12: 1 via ORAL

## 2012-11-12 MED ORDER — ACETAMINOPHEN 325 MG PO TABS
650.0000 mg | ORAL_TABLET | Freq: Once | ORAL | Status: AC
  Administered 2012-11-12: 325 mg via ORAL

## 2012-11-12 MED ORDER — DIPHENHYDRAMINE HCL 25 MG PO CAPS
25.0000 mg | ORAL_CAPSULE | Freq: Once | ORAL | Status: AC
  Administered 2012-11-12: 25 mg via ORAL

## 2012-11-12 MED ORDER — SODIUM CHLORIDE 0.9 % IV SOLN
250.0000 mL | Freq: Once | INTRAVENOUS | Status: AC
  Administered 2012-11-12: 250 mL via INTRAVENOUS

## 2012-11-12 MED ORDER — HEPARIN SOD (PORK) LOCK FLUSH 100 UNIT/ML IV SOLN
500.0000 [IU] | Freq: Every day | INTRAVENOUS | Status: AC | PRN
  Administered 2012-11-12: 500 [IU]
  Filled 2012-11-12: qty 5

## 2012-11-12 MED ORDER — SODIUM CHLORIDE 0.9 % IJ SOLN
10.0000 mL | INTRAMUSCULAR | Status: AC | PRN
  Administered 2012-11-12: 10 mL
  Filled 2012-11-12: qty 10

## 2012-11-12 NOTE — Progress Notes (Signed)
1030- Pt c/o pain in oral cavity from mouthsores.  Pt states the pain started 2 days ago.  Pt states he has been given a mouthwash from [facility] for the mouthsores, which helps them some, but does not really help the pain.  Pt states he does not know what he can take for the pain, as he has not been prescribed anything or told what he could take.  Informed Dr. Arline Asp of the above- verbal order received and read back to administer norco 5/325 x 1 now, and pt should contact providers at [facility] for further recommendations regarding pain medication.  Desk RN aware, and will contact [facility] about pt's lab values today, and pain, and have them contact pt.  Informed pt of above (through interpreter), and he verbalizes understanding.

## 2012-11-12 NOTE — Progress Notes (Signed)
CBC today, 11/12/2012, showed a white count less than 0.2, hemoglobin 10.2, hematocrit 29.2 and platelet count of 13,000.  Chemistries were notable for normal BUN and creatinine. Albumin was 2.2.  The patient was complaining of mucositis and pain. He apparently had been taking a mouth wash preparation from [facility] with only partial improvement. The patient was given hydrocodone here.    The patient was given a bag of pheresed platelets. All blood products are irradiated. He was given Tylenol 325 mg by mouth and Benadryl 25 mg by mouth as premeds.

## 2012-11-12 NOTE — Patient Instructions (Addendum)
Informacin sobre la transfusin de plaquetas (Platelet Transfusion Information) Esta es informacin acerca de la transfusin de plaquetas. Las plaquetas son pequeas clulas producidas en la mdula sea y que se encuentran en la sangre. Cuando un vaso sanguneo se daa, las plaquetas fluyen hacia la zona lesionada para formar un cogulo. Este procedimiento hace comenzar el proceso de curacin. Cuando el nivel de plaquetas es muy bajo en su sangre, podr tener problemas de coagulacin. Esto puede deberse a:  Public relations account executive.  Quimioterapia para tratar Science writer. A menudo, muchas veces los niveles bajos de plaquetas no causan problemas.  Las plaquetas pueden durar entre 7 y 35 das. Si no se utilizan en una lesin el bazo o el hgado las rompen. Los sntomas de un bajo recuento de plaquetas incluyen:  Hemorragias nasales.  Encas que sangran.  Abundante sangrado menstrual.  Hematomas y pequeos puntos rojos en la piel.  Los pequeos puntos de la piel se denominan petequias.  Grandes hematomas(prpura).  La hemorragia puede ser ms seria si sucede en el cerebro o el intestino. Las transfusiones de plaquetas a menudo se utilizan para [employer] aceptable. Es poco comn que se produzca una hemorragia grave debido a la baja cantidad de plaquetas. RIESGOS Y COMPLICACIONES  No son frecuentes los efectos secundarios graves de [place]. Las reacciones menores pueden incluir:  Picazn  Erupciones  Temperaturas alta y escalofros. Existen medicamentos disponibles para [employer] a las transfusiones. Comente con el profesional que lo asiste si tiene alguno de [employer].  Si recibe transfusiones de plaquetas de manera frecuente pueden ser menos efectivas. Esto se denomina ser refractario a la plaquetas. Esto no es frecuente. Puede suceder por causas inmunes y no inmunes. Las causas no inmunes incluyen:  Fiebre alta.  Algunos  medicamentos.  Bazo agrandado. Las causas inmunes se producen cuando su organismo detecta que las plaquetas no le son propias y fabrican anticuerpos en contra de ellas. [place] plaquetas rpidamente. An con la transfusin de plaquetas usted podra continuar con problemas de sangrado o hematomas. Comunquele a sus mdicos si esto le sucede. Hay otras medidas para tomar si esto le sucede. ANTES DEL [place] controlar su nivel de plaquetas de forma regular.  Si el nivel es muy bajo ser necesario realizarle una transfusin.  Esto es ms importante antes de ciertos procedimiento con riesgo de hemorragia como una puncin espinal.  La transfusin de plaquetas reduce el riesgo de hemorragias durante o despus del procedimiento.  Excepto durante emergencias, la transfusin requiere un consentimiento escrito. Antes de extraer sangre de un donante, se tomar un historial completo para asegurarse que la persona no tiene antecedentes de enfermedades previas y no est presenta conductas sociales de [place]. Son ejemplo de estas ltimas el consumo de drogas por va intravenosa o la actividad sexual con mltiples parejas. Esto podra llevar a que fueran utilizados tanto sangre como sus derivados estando infectados. Esto se realiza incluso adems del exhaustivo control para asegurarse de que la sangre es segura. Todos los productos sanguneos que se transfunden se estudian para asegurarse de que son compatibles con la persona que la recibe. Tambin se controla para descartar la presencia de infecciones. Actualmente la sangre de transfusin es ms segura que nunca antes. El riesgo de infeccin es [place]. [place] plaquetas se almacenan en pequeas bolsas plsticas que se guardan a bajas temperaturas.  Cada bolsa se denomina unidad y a Clinical cytogeneticist se administran dos. Se  administran a travs de una gua intravenosa por goteo durante aproximadamente media hora.  Por lo  general la sangre se obtiene de muchas personas para tener la cantidad suficiente para transfundir.  A veces, las plaquetas se obtienen de Teacher, music. Esto se realiza mediante una mquina especial que separa las plaquetas de la [place]. Esta se denomina mquina de afresis. Las plaquetas obtenidas por esta va se denominan plaquetas por afresis. Las plaquetas por afresis reducen el riesgo de volverse sensible a las plaquetas. Esto disminuye las posibilidades de tener una reaccin a la transfusin.  Como lleva muy poco tiempo donar plaquetas, este tratamiento puede administrarse en un consultorio externo. Las plaquetas tambin pueden administrarse antes o despus de otros tratamientos. SOLICITE ATENCIN MDICA INMEDIATAMENTE SI: Aparecen algunos de los siguientes sntomas en las 12 horas o en los das siguientes:  Siente escalofros.  Sube la fiebre, con una temperatura superior a los 102 F (38.9 C)  Dolores de espalda o [employer] personas que lo rodean creen que usted no Secondary school teacher o lo ven confundido  Sangre al orinar o en la deposicin o hemorragia en cualquier parte del cuerpo.  Le falta la respiracin o presenta dificultades respiratorias.  Mareos.  Desmayos  Desarrolla un sarpullido o urticaria.  Disminuye la cantidad de [place], o sta toma un color oscuro o cambia hacia un tono rosado, rojo o [place].  Dolor de cabeza intenso o rigidez en el cuello.  Se le forman hematomas fcilmente. Document Released: 11/06/2008 Document Revised: 10/13/2011 [facility] Patient Information 2013 [place], [place].  Platelet Transfusion Information This is information about transfusions of platelets. Platelets are tiny cells made by the bone marrow and found in the blood. When a blood vessel is damaged platelets rush to the damaged area to help form a clot. This begins the healing process. When platelets get very low your blood may have trouble clotting. This may be  from:  Illness.  Blood disorder.  Chemotherapy to treat cancer. Often lower platelet counts do not usually cause problems.  Platelets usually last for 7 to 10 days. If they are not used not used in an injury, they are broken down by the liver or spleen. Symptoms of low platelet count include:  Nosebleeds.  Bleeding gums.  Heavy periods.  Bruising and tiny blood spots in the skin.  Pin point spots of bleeding are called (petechiae).  Larger bruises (purpura).  Bleeding can be more serious if it happens in the brain or bowel. Platelet transfusions are often used to keep the platelet count at an acceptable level. Serious bleeding due to low platelets is uncommon. RISKS AND COMPLICATIONS Severe side effects from platelet transfusions are uncommon. Minor reactions may include:  Itching.  Rashes.  High temperature and shivering. Medications are available to stop transfusion reactions. Let your caregivers know if you develop any of the above problems.  If you are having platelet transfusions frequently they may get less effective. This is called becoming refractory to platelets. It is uncommon. This can happen from non-immune causes and immune causes. Non-immune causes include:  High temperatures.  Some medications.  An enlarged spleen. Immune causes happen when your body discovers the platelets are not your own and begin making antibodies against them. The antibodies kill the platelets quickly. Even with platelet transfusions you may still notice problems with bleeding or bruising. Let your caregivers know about this. Other things can be done to help if this happens.  BEFORE THE PROCEDURE   Your doctors will  check your platelet count regularly.  If the platelet count is too low it may be necessary to have a platelet transfusion.  This is more important before certain procedures with a risk of bleeding such as a spinal tap.  Platelet transfusion reduces the risk of bleeding  during or after the procedure.  Except in emergencies, giving a transfusion requires a written consent. Before blood is taken from a donor, a complete history is taken to make sure the person has no history of previous diseases, nor engages in risky social behavior. Examples of this are intravenous drug use or sexual activity with multiple partners. This could lead to infected blood or blood products being used. This history is done even in spite of the extensive testing to make sure the blood is safe. All blood products transfused are tested to make sure it is a match for the person getting the blood. It is also checked for infections. Blood is the safest it has ever been. The risk of getting an infection is very low. PROCEDURE  The platelets are stored in small plastic bags which are kept at a low temperature.  Each bag is called a unit and sometimes two units are given. They are given through an intravenous line by drip infusion over about one half hour.  Usually blood is collected from multiple people to get enough to transfuse.  Sometimes, the platelets are collected from a single person. This is done using a special machine that separates the platelets from the blood. The machine is called an apheresis machine. Platelets collected in this way are called apheresed platelets. Apheresed platelets reduce the risk of becoming sensitive to the platelets. This lowers the chances of having a transfusion reaction.  As it only takes a short time to give the platelets, this treatment can be given in an outpatients department. Platelets can also be given before or after other treatments. SEEK IMMEDIATE MEDICAL CARE IF: Any of the following symptoms over the next 12 hours or several days:  Shaking chills.  Fever with a temperature greater than 102 F (38.9 C) develops.  Back pain or muscle pain.  People around you feel you are not acting correctly, or you are confused.  Blood in the urine or  bowel movements or bleeding from any place in your body.  Shortness of breath, or difficulty breathing.  Dizziness.  Fainting.  You break out in a rash or develop hives.  You have a decrease in the amount of urine you are putting out, or the urine turns a dark color or changes to pink, red, or brown.  A severe headache or stiff neck.  Bruising more easily. Document Released: 05/18/2007 Document Revised: 10/13/2011 Document Reviewed: 05/18/2007 [facility] Patient Information 2013 [employer] [place], [place].

## 2012-11-12 NOTE — Progress Notes (Signed)
1130- Pt reports pain score 0/10.  Informed pt (through interpreter) of neutropenic precautions.

## 2012-11-12 NOTE — Telephone Encounter (Signed)
1030 S/w julia at [facility] that pt's WBC <0.2 and platelets 13. We are transfusing 1 unit of platelets.  1105 faxed labs to [facility]

## 2012-11-13 LAB — PREPARE PLATELET PHERESIS: Unit division: 0

## 2012-11-19 ENCOUNTER — Other Ambulatory Visit: Payer: No Typology Code available for payment source | Admitting: Lab

## 2012-11-22 ENCOUNTER — Encounter: Payer: Self-pay | Admitting: Oncology

## 2012-11-22 NOTE — Progress Notes (Signed)
We received a discharge summary from the patient's admission to [facility] from 10/26/2012 through 10/28/2012. Patient received chemotherapy as per CALGB 40981, this being Course 4, Module A. he received the following chemotherapy:  -Cytoxan 1000 mg per meter squared IV normal saline over 30 minutes, day 1. -Idarubicin 12 mg per meter squared IV normal saline over 10 minutes every 24 hours for 3 days. -Vincristine 2 mg IV normal saline over 5 minutes, day 1. -Dexamethasone 5 mg per meter squared every 12 hours.   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    We have a clinic note from 11/09/2012 from Dr. Miachel Roux. This was day 15 following chemotherapy on 10/26/2012. White count was 0.1 and platelet count was 4000. His physical exam was negative for hepatosplenomegaly or lymphadenopathy. The plan was to transfuse him with red cells and platelets. It was suggested that vincristine be held due to constipation.

## 2012-11-26 ENCOUNTER — Telehealth: Payer: Self-pay

## 2012-11-26 ENCOUNTER — Other Ambulatory Visit: Payer: Self-pay

## 2012-11-26 ENCOUNTER — Telehealth: Payer: Self-pay | Admitting: Oncology

## 2012-11-26 DIAGNOSIS — C9500 Acute leukemia of unspecified cell type not having achieved remission: Secondary | ICD-10-CM

## 2012-11-26 NOTE — Telephone Encounter (Signed)
lvm for karen at [facility] that pt's lab appt at [facility] is 4/29 at 1115. He is possible to be dc' d from [facility] Sunday or Monday.

## 2012-11-29 ENCOUNTER — Other Ambulatory Visit: Payer: Self-pay

## 2012-11-29 ENCOUNTER — Telehealth: Payer: Self-pay | Admitting: Oncology

## 2012-11-29 ENCOUNTER — Other Ambulatory Visit: Payer: No Typology Code available for payment source | Admitting: Lab

## 2012-11-29 DIAGNOSIS — C9500 Acute leukemia of unspecified cell type not having achieved remission: Secondary | ICD-10-CM

## 2012-11-29 NOTE — Telephone Encounter (Signed)
appts already adjusted per 4/28 as requested on 4/28 pof. S/w pt re next appt for 5/1 and 4/29 being cx'd. Pt will get new schedule when he comes in 5/1.

## 2012-11-30 ENCOUNTER — Other Ambulatory Visit: Payer: No Typology Code available for payment source | Admitting: Lab

## 2012-12-01 ENCOUNTER — Encounter: Payer: Self-pay | Admitting: Oncology

## 2012-12-01 NOTE — Progress Notes (Signed)
We received a clinic note from Dr. Miachel Roux dated 11/16/2012.  CBC was as follows:  White count 0.2 Hemoglobin 8.6 hematocrit 24.6  Platelet count was 5000  I assume the patient received a platelet transfusion. The plan was to delay his next course of chemotherapy until blood counts recover.  ECOG status was 1-2eat The patient was having painful mucositis. He was not able to eat but was trying to take in fluids. He was also having paresthesias in his fingertips.

## 2012-12-02 ENCOUNTER — Encounter (HOSPITAL_COMMUNITY)
Admission: RE | Admit: 2012-12-02 | Discharge: 2012-12-02 | Disposition: A | Discharge status: Home / Self Care | Payer: No Typology Code available for payment source | Source: Ambulatory Visit | Attending: Oncology | Admitting: Oncology

## 2012-12-02 ENCOUNTER — Encounter: Payer: Self-pay | Admitting: Medical Oncology

## 2012-12-02 ENCOUNTER — Other Ambulatory Visit (HOSPITAL_BASED_OUTPATIENT_CLINIC_OR_DEPARTMENT_OTHER): Payer: No Typology Code available for payment source

## 2012-12-02 DIAGNOSIS — C9500 Acute leukemia of unspecified cell type not having achieved remission: Secondary | ICD-10-CM

## 2012-12-02 LAB — MAGNESIUM (CC13): Magnesium: 2.2 mg/dl (ref 1.5–2.5)

## 2012-12-02 LAB — COMPREHENSIVE METABOLIC PANEL (CC13)
ALT: 75 U/L — ABNORMAL HIGH (ref 0–55)
Alkaline Phosphatase: 102 U/L (ref 40–150)
CO2: 26 mEq/L (ref 22–29)
Creatinine: 0.8 mg/dL (ref 0.7–1.3)
Total Bilirubin: 0.47 mg/dL (ref 0.20–1.20)

## 2012-12-02 LAB — CBC WITH DIFFERENTIAL/PLATELET
BASO%: 0.6 % (ref 0.0–2.0)
EOS%: 0 % (ref 0.0–7.0)
Eosinophils Absolute: 0 10*3/uL (ref 0.0–0.5)
LYMPH%: 26.8 % (ref 14.0–49.0)
MCH: 29.6 pg (ref 27.2–33.4)
MCHC: 34 g/dL (ref 32.0–36.0)
MCV: 87.1 fL (ref 79.3–98.0)
MONO%: 22.9 % — ABNORMAL HIGH (ref 0.0–14.0)
Platelets: 76 10*3/uL — ABNORMAL LOW (ref 140–400)
RBC: 3.41 10*6/uL — ABNORMAL LOW (ref 4.20–5.82)
nRBC: 1 % — ABNORMAL HIGH (ref 0–0)

## 2012-12-02 NOTE — Progress Notes (Unsigned)
Labs faxed to [facility]  

## 2012-12-06 ENCOUNTER — Encounter: Payer: Self-pay | Admitting: Medical Oncology

## 2012-12-06 ENCOUNTER — Other Ambulatory Visit (HOSPITAL_BASED_OUTPATIENT_CLINIC_OR_DEPARTMENT_OTHER): Payer: No Typology Code available for payment source

## 2012-12-06 DIAGNOSIS — C9500 Acute leukemia of unspecified cell type not having achieved remission: Secondary | ICD-10-CM

## 2012-12-06 LAB — CBC WITH DIFFERENTIAL/PLATELET
Basophils Absolute: 0 10*3/uL (ref 0.0–0.1)
HCT: 28 % — ABNORMAL LOW (ref 38.4–49.9)
HGB: 9.7 g/dL — ABNORMAL LOW (ref 13.0–17.1)
MCH: 30.4 pg (ref 27.2–33.4)
MONO#: 0.7 10*3/uL (ref 0.1–0.9)
NEUT%: 59.9 % (ref 39.0–75.0)
WBC: 2.7 10*3/uL — ABNORMAL LOW (ref 4.0–10.3)
lymph#: 0.4 10*3/uL — ABNORMAL LOW (ref 0.9–3.3)

## 2012-12-06 LAB — COMPREHENSIVE METABOLIC PANEL (CC13)
AST: 44 U/L — ABNORMAL HIGH (ref 5–34)
Albumin: 3.4 g/dL — ABNORMAL LOW (ref 3.5–5.0)
Alkaline Phosphatase: 89 U/L (ref 40–150)
Potassium: 3.1 mEq/L — ABNORMAL LOW (ref 3.5–5.1)
Sodium: 141 mEq/L (ref 136–145)
Total Protein: 7 g/dL (ref 6.4–8.3)

## 2012-12-06 NOTE — Progress Notes (Signed)
Labs from today faxed to [facility] to [phone]

## 2012-12-07 ENCOUNTER — Other Ambulatory Visit: Payer: No Typology Code available for payment source | Admitting: Lab

## 2012-12-09 ENCOUNTER — Telehealth: Payer: Self-pay

## 2012-12-09 ENCOUNTER — Other Ambulatory Visit (HOSPITAL_BASED_OUTPATIENT_CLINIC_OR_DEPARTMENT_OTHER): Payer: No Typology Code available for payment source

## 2012-12-09 DIAGNOSIS — C9500 Acute leukemia of unspecified cell type not having achieved remission: Secondary | ICD-10-CM

## 2012-12-09 LAB — COMPREHENSIVE METABOLIC PANEL (CC13)
ALT: 51 U/L (ref 0–55)
BUN: 6 mg/dL — ABNORMAL LOW (ref 7.0–26.0)
CO2: 22 mEq/L (ref 22–29)
Creatinine: 0.8 mg/dL (ref 0.7–1.3)
Total Bilirubin: 0.58 mg/dL (ref 0.20–1.20)

## 2012-12-09 LAB — CBC WITH DIFFERENTIAL/PLATELET
BASO%: 0.6 % (ref 0.0–2.0)
HCT: 26.2 % — ABNORMAL LOW (ref 38.4–49.9)
LYMPH%: 14.4 % (ref 14.0–49.0)
MCH: 30.9 pg (ref 27.2–33.4)
MCHC: 35 g/dL (ref 32.0–36.0)
MONO#: 0.4 10*3/uL (ref 0.1–0.9)
NEUT%: 65.6 % (ref 39.0–75.0)
Platelets: 158 10*3/uL (ref 140–400)
WBC: 2.3 10*3/uL — ABNORMAL LOW (ref 4.0–10.3)

## 2012-12-09 LAB — MAGNESIUM (CC13): Magnesium: 2.1 mg/dl (ref 1.5–2.5)

## 2012-12-09 LAB — HOLD TUBE, BLOOD BANK

## 2012-12-09 NOTE — Telephone Encounter (Signed)
Faxed 12/09/12 labs to [facility] -Dr Lowell Guitar

## 2012-12-14 ENCOUNTER — Other Ambulatory Visit: Payer: No Typology Code available for payment source | Admitting: Lab

## 2012-12-15 ENCOUNTER — Encounter: Payer: Self-pay | Admitting: Oncology

## 2012-12-15 ENCOUNTER — Other Ambulatory Visit: Payer: Self-pay

## 2012-12-15 ENCOUNTER — Other Ambulatory Visit: Payer: Self-pay | Admitting: Oncology

## 2012-12-15 DIAGNOSIS — C9500 Acute leukemia of unspecified cell type not having achieved remission: Secondary | ICD-10-CM

## 2012-12-16 ENCOUNTER — Telehealth: Payer: Self-pay

## 2012-12-16 ENCOUNTER — Telehealth: Payer: Self-pay | Admitting: Oncology

## 2012-12-16 NOTE — Telephone Encounter (Signed)
Called and spoke with Meriam Sprague, RN to give her pt's appt for neulasta injection 12/17/12 (per previous desk RN request).  She verbalizes understanding and will give this information to Maureen Ralphs to put on pt's discharge paperwork.

## 2012-12-17 ENCOUNTER — Ambulatory Visit (HOSPITAL_BASED_OUTPATIENT_CLINIC_OR_DEPARTMENT_OTHER): Payer: No Typology Code available for payment source

## 2012-12-17 ENCOUNTER — Telehealth: Payer: Self-pay | Admitting: Oncology

## 2012-12-17 VITALS — BP 136/86 | HR 104 | Temp 98.4°F

## 2012-12-17 DIAGNOSIS — Z5189 Encounter for other specified aftercare: Secondary | ICD-10-CM

## 2012-12-17 DIAGNOSIS — C9500 Acute leukemia of unspecified cell type not having achieved remission: Secondary | ICD-10-CM

## 2012-12-17 MED ORDER — PEGFILGRASTIM INJECTION 6 MG/0.6ML
6.0000 mg | Freq: Once | SUBCUTANEOUS | Status: AC
  Administered 2012-12-17: 6 mg via SUBCUTANEOUS
  Filled 2012-12-17: qty 0.6

## 2012-12-17 NOTE — Telephone Encounter (Signed)
Per desk nurse ok per pt to have PRBC's 1unit 5/19 and 1unit 5/20. S/w pt re next appt for 5/19 @ 1:45pm. Pt to get schedule when he comes 5/19.

## 2012-12-20 ENCOUNTER — Other Ambulatory Visit: Payer: Self-pay | Admitting: Medical Oncology

## 2012-12-20 ENCOUNTER — Ambulatory Visit (HOSPITAL_BASED_OUTPATIENT_CLINIC_OR_DEPARTMENT_OTHER): Payer: Self-pay

## 2012-12-20 ENCOUNTER — Other Ambulatory Visit (HOSPITAL_BASED_OUTPATIENT_CLINIC_OR_DEPARTMENT_OTHER): Payer: Self-pay | Admitting: Lab

## 2012-12-20 VITALS — BP 115/69 | HR 80 | Temp 98.6°F | Resp 20

## 2012-12-20 DIAGNOSIS — C9500 Acute leukemia of unspecified cell type not having achieved remission: Secondary | ICD-10-CM

## 2012-12-20 LAB — COMPREHENSIVE METABOLIC PANEL (CC13)
ALT: 122 U/L — ABNORMAL HIGH (ref 0–55)
AST: 67 U/L — ABNORMAL HIGH (ref 5–34)
CO2: 24 mEq/L (ref 22–29)
Chloride: 108 mEq/L — ABNORMAL HIGH (ref 98–107)
Creatinine: 0.7 mg/dL (ref 0.7–1.3)
Sodium: 140 mEq/L (ref 136–145)
Total Bilirubin: 1.05 mg/dL (ref 0.20–1.20)
Total Protein: 6.5 g/dL (ref 6.4–8.3)

## 2012-12-20 LAB — CBC WITH DIFFERENTIAL/PLATELET
BASO%: 1.1 % (ref 0.0–2.0)
EOS%: 0.6 % (ref 0.0–7.0)
LYMPH%: 2.6 % — ABNORMAL LOW (ref 14.0–49.0)
MCH: 31 pg (ref 27.2–33.4)
MCHC: 34.9 g/dL (ref 32.0–36.0)
MONO#: 0 10*3/uL — ABNORMAL LOW (ref 0.1–0.9)
NEUT%: 95.2 % — ABNORMAL HIGH (ref 39.0–75.0)
RBC: 2.69 10*6/uL — ABNORMAL LOW (ref 4.20–5.82)
WBC: 1.3 10*3/uL — ABNORMAL LOW (ref 4.0–10.3)
lymph#: 0 10*3/uL — ABNORMAL LOW (ref 0.9–3.3)

## 2012-12-20 LAB — MAGNESIUM (CC13): Magnesium: 2.2 mg/dl (ref 1.5–2.5)

## 2012-12-20 LAB — PREPARE RBC (CROSSMATCH)

## 2012-12-20 MED ORDER — DIPHENHYDRAMINE HCL 25 MG PO CAPS
25.0000 mg | ORAL_CAPSULE | Freq: Once | ORAL | Status: AC
  Administered 2012-12-20: 25 mg via ORAL

## 2012-12-20 MED ORDER — SODIUM CHLORIDE 0.9 % IV SOLN
250.0000 mL | Freq: Once | INTRAVENOUS | Status: AC
  Administered 2012-12-20: 250 mL via INTRAVENOUS

## 2012-12-20 MED ORDER — ACETAMINOPHEN 325 MG PO TABS
650.0000 mg | ORAL_TABLET | Freq: Once | ORAL | Status: AC
  Administered 2012-12-20: 650 mg via ORAL

## 2012-12-20 MED ORDER — HEPARIN SOD (PORK) LOCK FLUSH 100 UNIT/ML IV SOLN
500.0000 [IU] | Freq: Every day | INTRAVENOUS | Status: AC | PRN
  Administered 2012-12-20: 500 [IU]
  Filled 2012-12-20: qty 5

## 2012-12-20 MED ORDER — SODIUM CHLORIDE 0.9 % IJ SOLN
10.0000 mL | INTRAMUSCULAR | Status: AC | PRN
  Administered 2012-12-20: 10 mL
  Filled 2012-12-20: qty 10

## 2012-12-20 NOTE — Patient Instructions (Addendum)
Informacin sobre la transfusin de sangre (Blood Transfusion Information) QU ES UNA TRANSFUSIN DE SANGRE? Una transfusin [employer] de todos o algunos de los componentes de la [place]. La sangre est formada por mltiples clulas que cumplen diferentes funciones.  Los glbulos rojos llevan el oxgeno y se usan para reemplazar prdidas sanguneas.  Los glbulos blancos luchan contra las infecciones.  Las plaquetas [employer].  El plasma provee factores coagulantes.  Existen otros productos disponibles para necesidades especiales como la hemofilia u otros problemas de coagulacin. ANTES DE LA TRANSFUSIN Quin puede donar sangre para transfusiones?   Usted puede donar sangre para ser utilizada ms tarde en usted mismo ( esto se denomina donacin autloga).  Se puede solicitar a los familiares que [employer]. Esto, generalmente, no es ms seguro que si recibe sangre de un extrao.  Es segura la sangre de voluntarios sanos que hayan sido completamente evaluados. Esto es un banco de [place]. Una transfusin de sangre es ahora ms segura que nunca antes en la prctica de la [place]. Antes de obtener sangre de un donante, se le confecciona una historia completa para asegurarse de que esa persona no tenga antecedentes de enfermedades, ni est involucrado en conductas de riesgo (por ejemplo el uso de drogas intravenosas o actividad sexual con mltiples parejas). Se controlar el historial de viajes del donante para minimizar los riesgos de la transmisin de infecciones como la malaria. La sangre donada se analizar para descartar enfermedades infecciosas como VIH y hepatitis. Luego se probar para asegurarse de que es compatible con usted para minimizar las posibilidades de una reaccin a la transfusin. Si usted o un familiar suyo donan sangre, sto se realiza con anterioridad a [place] y no es adecuado en situaciones de Associate Professor. El [place] de donacin toma  [address]. RIESGOS Y COMPLICACIONES Aunque la terapia de transfusin es muy segura y salva muchas vidas, sus principales peligros incluyen:   El contacto con enfermedades infecciosas.  El desarrollo de una reaccin. Se trata de una reaccin alrgica a algn compuesto de la sangre que se recibi. Para prevenir esta situacin, se toman todas las medidas posibles. Estas decisiones son consideradas detenidamente por su mdico antes de la transfusin. No se realiza una transfusin de [place] a menos que los beneficios sean [employer]. DESPUS DEL PROCEDIMIENTO  Inmediatamente despus de recibir una transfusin, se sentir mucho mejor y con ms energa. Esto se percibe especialmente si usted estaba muy anmico. Significa que sus glbulos rojos haban disminuido. La transfusin eleva el nivel de los glbulos rojos que son los que transportan oxgeno y esto generalmente hace que la energa aumente.  Los enfermeros que hayan administrado la transfusin lo controlarn cuidadosamente ante cualquier complicacin. INSTRUCCIONES PARA EL CUIDADO DOMICILIARIO No existen instrucciones especiales para despus de una transfusin. Se sentir ms enrgico. Hable con el mdico acerca de cualquier limitacin sobre las actividades respecto de las enfermedades subyacentes que pueden aparecer como un ataque cardaco.  SOLICITE ATENCIN MDICA SI:  Su enfermedad no mejora luego de la transfusin.  Desarrolla enrojecimiento o irritacin en la zona donde se ha colocado la lnea intravenosa. SOLICITE ATENCIN MDICA DE INMEDIATO SI: Aparecen algunos de los siguientes sntomas en las 12 horas siguientes:  Siente escalofros.  Usted tiene una temperatura oral de ms de 102 F (38.9 C) y no puede controlarla con medicamentos.  Dolores de [place], pecho, o musculares.  Las personas que lo rodean creen que usted no acta correctamente o lo ven confundido.  Le falta la respiracin o presenta dificultades  respiratorias.  Mareos o sufre desmayos.  Desarrolla un sarpullido o urticaria.  Disminuye la cantidad [place].  La orina se torna de color oscuro o cambia a un color rosado, rojo o marrn. Aparecen algunos de los siguientes sntomas en los [address] posteriores a la transfusin:  Usted tiene una temperatura oral de ms de 102 F (38.9 C) y no puede controlarla con medicamentos.  Falta de aire.  Debilidad luego de las [address].  Disminucin en la cantidad de [place], u orina con menos frecuencia.  La parte blanca del ojo se vuelve amarilla (Ictericia).  El color de la orina se vuelve rosado, rojo o [place]. Document Released: 07/21/2005 Document Revised: 10/13/2011 [facility] Patient Information 2013 [place], [place].

## 2012-12-21 ENCOUNTER — Ambulatory Visit (HOSPITAL_BASED_OUTPATIENT_CLINIC_OR_DEPARTMENT_OTHER): Payer: No Typology Code available for payment source

## 2012-12-21 VITALS — BP 128/81 | HR 73 | Temp 98.2°F | Resp 20

## 2012-12-21 DIAGNOSIS — C9500 Acute leukemia of unspecified cell type not having achieved remission: Secondary | ICD-10-CM

## 2012-12-21 DIAGNOSIS — D649 Anemia, unspecified: Secondary | ICD-10-CM

## 2012-12-21 MED ORDER — HEPARIN SOD (PORK) LOCK FLUSH 100 UNIT/ML IV SOLN
500.0000 [IU] | Freq: Every day | INTRAVENOUS | Status: DC | PRN
  Filled 2012-12-21: qty 5

## 2012-12-21 MED ORDER — SODIUM CHLORIDE 0.9 % IJ SOLN
10.0000 mL | INTRAMUSCULAR | Status: DC | PRN
  Filled 2012-12-21: qty 10

## 2012-12-21 MED ORDER — DIPHENHYDRAMINE HCL 25 MG PO CAPS
25.0000 mg | ORAL_CAPSULE | Freq: Once | ORAL | Status: AC
  Administered 2012-12-21: 25 mg via ORAL

## 2012-12-21 MED ORDER — ACETAMINOPHEN 325 MG PO TABS
650.0000 mg | ORAL_TABLET | Freq: Once | ORAL | Status: AC
  Administered 2012-12-21: 650 mg via ORAL

## 2012-12-21 MED ORDER — SODIUM CHLORIDE 0.9 % IV SOLN: 250.0000 mL | Freq: Once | INTRAVENOUS | Status: DC

## 2012-12-21 NOTE — Patient Instructions (Addendum)
Transfursin de sangre  (Blood Transfusion) En una transfusin se repone la sangre o algunos de sus componentes. Debe reponerse si ha perdido [place] debido a [place], un accidente, o debido a trastornos graves como la anemia. Puede donar sangre para que se vuelva a Chemical engineer en usted mismo si tiene planeado realizarse [place]. Si pierde sangre durante esa [place], puede utilizarse su misma sangre para reponerla. Toda la sangre que se utilice en usted se verifica para asegurarse de que concuerde el grupo sanguneo. Se controlarn con frecuencia la temperatura, la presin arterial, y la frecuencia cardaca (signos vitales).  SOLICITE AYUDA DE INMEDIATO SI:   Si tiene malestar estomacal (nuseas) o vmitos.  Tiene materia fecal lquida (diarrea).  Le falta el aire o tiene dificultad para respirar.  Observa sangre en su orina, o tiene orina de color oscuro.  Siente dolor o presin en el pecho.  Sus ojos o su piel se vuelven amarillos (ictericia).  La temperatura oral le sube a ms de 102 F (38.9 C) y no puede bajarla con medicamentos.  Comienza a temblar y tiene escalofros.  Observa una erupcin roja (urticaria) o siente picazn.  Se siente mareado o confundido.  Siente dolor en la espalda las articulaciones, o los msculos.  No siente hambre (prdida del apetito).  Se siente cansado, inquieto o nervioso.  Tiene clicos en el vientre (abdominales). Document Released: 08/23/2010 Document Revised: 10/13/2011 [facility] Patient Information 2013 [place], [place].

## 2012-12-21 NOTE — Progress Notes (Signed)
Separate instructions given to patient in Spanish per MedLine on Google search. Patient with interpreter today, who will have patient ask Dr Miachel Roux more details and questions regarding his diagnosis. Interpreter states that patient can not read either.

## 2012-12-22 LAB — TYPE AND SCREEN: Antibody Screen: NEGATIVE

## 2012-12-23 ENCOUNTER — Other Ambulatory Visit (HOSPITAL_BASED_OUTPATIENT_CLINIC_OR_DEPARTMENT_OTHER): Payer: No Typology Code available for payment source | Admitting: Lab

## 2012-12-23 ENCOUNTER — Ambulatory Visit (HOSPITAL_BASED_OUTPATIENT_CLINIC_OR_DEPARTMENT_OTHER): Payer: No Typology Code available for payment source

## 2012-12-23 ENCOUNTER — Other Ambulatory Visit: Payer: Self-pay | Admitting: Medical Oncology

## 2012-12-23 ENCOUNTER — Other Ambulatory Visit: Payer: Self-pay | Admitting: *Deleted

## 2012-12-23 VITALS — BP 129/82 | HR 75 | Temp 98.7°F | Resp 18

## 2012-12-23 DIAGNOSIS — C9500 Acute leukemia of unspecified cell type not having achieved remission: Secondary | ICD-10-CM

## 2012-12-23 LAB — CBC WITH DIFFERENTIAL/PLATELET
HCT: 23.6 % — ABNORMAL LOW (ref 38.4–49.9)
MCV: 85.4 fL (ref 79.3–98.0)
Platelets: 4 10*3/uL — CL (ref 140–400)
RBC: 2.76 10*6/uL — ABNORMAL LOW (ref 4.20–5.82)
WBC: <0.2 10*3/uL — CL (ref 4.0–10.3)

## 2012-12-23 MED ORDER — HEPARIN SOD (PORK) LOCK FLUSH 100 UNIT/ML IV SOLN
500.0000 [IU] | Freq: Every day | INTRAVENOUS | Status: AC | PRN
  Administered 2012-12-23: 500 [IU]
  Filled 2012-12-23: qty 5

## 2012-12-23 MED ORDER — ACETAMINOPHEN 325 MG PO TABS
650.0000 mg | ORAL_TABLET | Freq: Once | ORAL | Status: AC
  Administered 2012-12-23: 650 mg via ORAL

## 2012-12-23 MED ORDER — DIPHENHYDRAMINE HCL 25 MG PO CAPS
25.0000 mg | ORAL_CAPSULE | Freq: Once | ORAL | Status: AC
  Administered 2012-12-23: 25 mg via ORAL

## 2012-12-23 MED ORDER — SODIUM CHLORIDE 0.9 % IV SOLN
250.0000 mL | Freq: Once | INTRAVENOUS | Status: AC
  Administered 2012-12-23: 250 mL via INTRAVENOUS

## 2012-12-23 MED ORDER — SODIUM CHLORIDE 0.9 % IJ SOLN
10.0000 mL | INTRAMUSCULAR | Status: AC | PRN
  Administered 2012-12-23: 10 mL
  Filled 2012-12-23: qty 10

## 2012-12-23 NOTE — Patient Instructions (Addendum)
Informacin sobre la transfusin de sangre (Blood Transfusion Information) QU ES UNA TRANSFUSIN DE SANGRE? Una transfusin consiste en el reemplazo de todos o algunos de los componentes de la sangre. La sangre est formada por mltiples clulas que cumplen diferentes funciones.  Los glbulos rojos llevan el oxgeno y se usan para reemplazar prdidas sanguneas.  Los glbulos blancos luchan contra las infecciones.  Las plaquetas controlan las hemorragias.  El plasma provee factores coagulantes.  Existen otros productos disponibles para necesidades especiales como la hemofilia u otros problemas de coagulacin. ANTES DE LA TRANSFUSIN Quin puede donar sangre para transfusiones?   Usted puede donar sangre para ser utilizada ms tarde en usted mismo ( esto se denomina donacin autloga).  Se puede solicitar a los familiares que donen sangre. Esto, generalmente, no es ms seguro que si recibe sangre de un extrao.  Es segura la sangre de voluntarios sanos que hayan sido completamente evaluados. Esto es un banco de sangre. Una transfusin de sangre es ahora ms segura que nunca antes en la prctica de la medicina. Antes de obtener sangre de un donante, se le confecciona una historia completa para asegurarse de que esa persona no tenga antecedentes de enfermedades, ni est involucrado en conductas de riesgo (por ejemplo el uso de drogas intravenosas o actividad sexual con mltiples parejas). Se controlar el historial de viajes del donante para minimizar los riesgos de la transmisin de infecciones como la malaria. La sangre donada se analizar para descartar enfermedades infecciosas como VIH y hepatitis. Luego se probar para asegurarse de que es compatible con usted para minimizar las posibilidades de una reaccin a la transfusin. Si usted o un familiar suyo donan sangre, sto se realiza con anterioridad a una ciruga y no es adecuado en situaciones de emergencia. El proceso de donacin toma  varios das. RIESGOS Y COMPLICACIONES Aunque la terapia de transfusin es muy segura y salva muchas vidas, sus principales peligros incluyen:   El contacto con enfermedades infecciosas.  El desarrollo de una reaccin. Se trata de una reaccin alrgica a algn compuesto de la sangre que se recibi. Para prevenir esta situacin, se toman todas las medidas posibles. Estas decisiones son consideradas detenidamente por su mdico antes de la transfusin. No se realiza una transfusin de sangre a menos que los beneficios sean mayores que los riesgos. DESPUS DEL PROCEDIMIENTO  Inmediatamente despus de recibir una transfusin, se sentir mucho mejor y con ms energa. Esto se percibe especialmente si usted estaba muy anmico. Significa que sus glbulos rojos haban disminuido. La transfusin eleva el nivel de los glbulos rojos que son los que transportan oxgeno y esto generalmente hace que la energa aumente.  Los enfermeros que hayan administrado la transfusin lo controlarn cuidadosamente ante cualquier complicacin. INSTRUCCIONES PARA EL CUIDADO DOMICILIARIO No existen instrucciones especiales para despus de una transfusin. Se sentir ms enrgico. Hable con el mdico acerca de cualquier limitacin sobre las actividades respecto de las enfermedades subyacentes que pueden aparecer como un ataque cardaco.  SOLICITE ATENCIN MDICA SI:  Su enfermedad no mejora luego de la transfusin.  Desarrolla enrojecimiento o irritacin en la zona donde se ha colocado la lnea intravenosa. SOLICITE ATENCIN MDICA DE INMEDIATO SI: Aparecen algunos de los siguientes sntomas en las 12 horas siguientes:  Siente escalofros.  Usted tiene una temperatura oral de ms de 38,9 C (102 F) y no puede controlarla con medicamentos.  Dolores de espalda, pecho, o musculares.  Las personas que lo rodean creen que usted no acta correctamente o lo ven confundido.    Le falta la respiracin o presenta dificultades  respiratorias.  Mareos o sufre desmayos.  Desarrolla un sarpullido o urticaria.  Disminuye la cantidad [place].  La orina se torna de color oscuro o cambia a un color rosado, rojo o marrn. Aparecen algunos de los siguientes sntomas en los [address] posteriores a la transfusin:  Usted tiene una temperatura oral de ms de 38,9 C (102 F) y no puede controlarla con medicamentos.  Falta de aire.  Debilidad luego de las [address].  Disminucin en la cantidad de [place], u orina con menos frecuencia.  La parte blanca del ojo se vuelve amarilla (Ictericia).  El color de la orina se vuelve rosado, rojo o [place]. Document Released: 07/21/2005 Document Revised: 10/13/2011 [facility] Patient Information 2014 [place], [place].   Informacin sobre la transfusin de plaquetas (Platelet Transfusion Information) Esta es informacin acerca de la transfusin de plaquetas. Las plaquetas son pequeas clulas producidas en la mdula sea y que se encuentran en la sangre. Cuando un vaso sanguneo se daa, las plaquetas fluyen hacia la zona lesionada para formar un cogulo. Este procedimiento hace comenzar el proceso de curacin. Cuando el nivel de plaquetas es muy bajo en su sangre, podr tener problemas de coagulacin. Esto puede deberse a:  Higher education careers adviser.  Quimioterapia para tratar Management consultant. A menudo, muchas veces los niveles bajos de plaquetas no causan problemas.  Las plaquetas pueden durar entre 7 y [address]. Si no se utilizan en una lesin el bazo o el hgado las rompen. Los sntomas de un bajo recuento de plaquetas incluyen:  Hemorragias nasales.  Encas que sangran.  Abundante sangrado menstrual.  Hematomas y pequeos puntos rojos en la piel.  Los pequeos puntos de la piel se denominan petequias.  Grandes hematomas(prpura).  La hemorragia puede ser ms seria si sucede en el cerebro o el intestino. Las transfusiones de plaquetas a menudo se  utilizan para Museum/gallery curator aceptable. Es poco comn que se produzca una hemorragia grave debido a la baja cantidad de plaquetas. RIESGOS Y COMPLICACIONES  No son frecuentes los efectos secundarios graves de [place]. Las reacciones menores pueden incluir:  Picazn  Erupciones  Temperaturas alta y escalofros. Existen medicamentos disponibles para [employer] a las transfusiones. Comente con el profesional que lo asiste si tiene alguno de [employer].  Si recibe transfusiones de plaquetas de manera frecuente pueden ser menos efectivas. Esto se denomina ser refractario a la plaquetas. Esto no es frecuente. Puede suceder por causas inmunes y no inmunes. Las causas no inmunes incluyen:  Fiebre alta.  Algunos medicamentos.  Bazo agrandado. Las causas inmunes se producen cuando su organismo detecta que las plaquetas no le son propias y fabrican anticuerpos en contra de ellas. Los anticuerpos destruyen las plaquetas rpidamente. An con la transfusin de plaquetas usted podra continuar con problemas de sangrado o hematomas. Comunquele a sus mdicos si esto le sucede. Hay otras medidas para tomar si esto le sucede. ANTES DEL PROCEDIMIENTO  El mdico controlar su nivel de plaquetas de forma regular.  Si el nivel es muy bajo ser necesario realizarle una transfusin.  Esto es ms importante antes de ciertos procedimiento con riesgo de hemorragia como una puncin espinal.  La transfusin de plaquetas reduce el riesgo de hemorragias durante o despus del procedimiento.  Excepto durante emergencias, la transfusin requiere un consentimiento escrito. Antes de extraer sangre de un donante, se tomar un historial completo para asegurarse que la persona no tiene antecedentes de enfermedades  previas y no est presenta conductas sociales de riesgo. Son ejemplo de estas ltimas el consumo de drogas por va intravenosa o la actividad sexual con mltiples parejas. Esto podra  llevar a que fueran utilizados tanto sangre como sus derivados estando infectados. Esto se realiza incluso adems del exhaustivo control para asegurarse de que la sangre es segura. Todos los productos sanguneos que se transfunden se estudian para asegurarse de que son compatibles con la persona que la recibe. Tambin se controla para descartar la presencia de infecciones. Actualmente la sangre de transfusin es ms segura que nunca antes. El riesgo de infeccin es [place]. PROCEDIMIENTO  Las plaquetas se almacenan en pequeas bolsas plsticas que se guardan a bajas temperaturas.  Cada bolsa se denomina unidad y a Occupational psychologist se [address]. Se administran a travs de una gua intravenosa por goteo durante aproximadamente media hora.  Por lo general la sangre se obtiene de muchas personas para tener la cantidad suficiente para transfundir.  A veces, las plaquetas se obtienen de Teacher, music. Esto se realiza mediante una mquina especial que separa las plaquetas de la [place]. Esta se denomina mquina de afresis. Las plaquetas obtenidas por esta va se denominan plaquetas por afresis. Las plaquetas por afresis reducen el riesgo de volverse sensible a las plaquetas. Esto disminuye las posibilidades de tener una reaccin a la transfusin.  Como lleva muy poco tiempo donar plaquetas, este tratamiento puede administrarse en un consultorio externo. Las plaquetas tambin pueden administrarse antes o despus de otros tratamientos. SOLICITE ATENCIN MDICA INMEDIATAMENTE SI: Aparecen algunos de los siguientes sntomas en las 12 horas o en los das siguientes:  Siente escalofros.  Sube la fiebre, con una temperatura superior a los 102 F (38.9 C)  Dolores de espalda o [employer] personas que lo rodean creen que usted no Secondary school teacher o lo ven confundido  Sangre al orinar o en la deposicin o hemorragia en cualquier parte del cuerpo.  Le falta la respiracin o presenta dificultades  respiratorias.  Mareos.  Desmayos  Desarrolla un sarpullido o urticaria.  Disminuye la cantidad de [place], o sta toma un color oscuro o cambia hacia un tono rosado, rojo o [place].  Dolor de cabeza intenso o rigidez en el cuello.  Se le forman hematomas fcilmente. Document Released: 11/06/2008 Document Revised: 10/13/2011 [facility] Patient Information 2014 [place], [place].

## 2012-12-24 LAB — TYPE AND SCREEN: Unit division: 0

## 2012-12-24 LAB — PREPARE PLATELET PHERESIS

## 2012-12-29 ENCOUNTER — Encounter: Payer: Self-pay | Admitting: Oncology

## 2012-12-29 NOTE — Progress Notes (Signed)
We received a clinic note dated 12/14/2012, the day of planned admission for this patient. I believe the patient's last treatment had been on 10/26/2012, course 4, module A with idarubicin substituted for daunorubicin because of decreased supply of daunorubicin.  It was noted that the patient had required admission to the hospital from 11/16/2012 through 11/29/2012 for dehydration, failure to thrive and grade 3 mucositis. It was also noted that the patient developed a neutropenic fever and was treated emperically with antibiotics.   At the time of his appointment, ECOG performance status was 1. Patient continued on Bactrim every Monday Wednesday and Friday as well as Arixtra injections. The patient holds his Arixtra prior to planned lumbar punctures.    The patient was admitted on 12/14/2012 and discharged the following day. He received course 5 module B as per CALGB 16109. The patient underwent a lumbar puncture under fluoroscopy with administration of intrathecal methotrexate. He also received IV Cytoxan and cytarabine.   Transfusion goals are to keep the hemoglobin greater than 8 and the platelets greater than 10,000.

## 2012-12-30 ENCOUNTER — Telehealth: Payer: Self-pay

## 2012-12-30 ENCOUNTER — Encounter: Payer: Self-pay | Admitting: Oncology

## 2012-12-30 ENCOUNTER — Other Ambulatory Visit: Payer: Self-pay

## 2012-12-30 ENCOUNTER — Other Ambulatory Visit (HOSPITAL_BASED_OUTPATIENT_CLINIC_OR_DEPARTMENT_OTHER): Payer: No Typology Code available for payment source | Admitting: Lab

## 2012-12-30 ENCOUNTER — Ambulatory Visit (HOSPITAL_BASED_OUTPATIENT_CLINIC_OR_DEPARTMENT_OTHER): Payer: No Typology Code available for payment source

## 2012-12-30 VITALS — BP 122/80 | HR 66 | Temp 98.0°F | Resp 20

## 2012-12-30 DIAGNOSIS — C9500 Acute leukemia of unspecified cell type not having achieved remission: Secondary | ICD-10-CM

## 2012-12-30 DIAGNOSIS — C9200 Acute myeloblastic leukemia, not having achieved remission: Secondary | ICD-10-CM

## 2012-12-30 LAB — CBC WITH DIFFERENTIAL/PLATELET
BASO%: 0.3 % (ref 0.0–2.0)
EOS%: 0.7 % (ref 0.0–7.0)
HCT: 31.8 % — ABNORMAL LOW (ref 38.4–49.9)
LYMPH%: 6 % — ABNORMAL LOW (ref 14.0–49.0)
MCH: 29.3 pg (ref 27.2–33.4)
MCHC: 35.3 g/dL (ref 32.0–36.0)
MONO#: 0.6 10*3/uL (ref 0.1–0.9)
MONO%: 23.1 % — ABNORMAL HIGH (ref 0.0–14.0)
NEUT%: 69.9 % (ref 39.0–75.0)
Platelets: 19 10*3/uL — ABNORMAL LOW (ref 140–400)
RBC: 3.83 10*6/uL — ABNORMAL LOW (ref 4.20–5.82)
WBC: 2.5 10*3/uL — ABNORMAL LOW (ref 4.0–10.3)

## 2012-12-30 LAB — FIBRINOGEN: Fibrinogen: 305 mg/dL (ref 204–475)

## 2012-12-30 MED ORDER — DIPHENHYDRAMINE HCL 25 MG PO CAPS
25.0000 mg | ORAL_CAPSULE | Freq: Once | ORAL | Status: AC
  Administered 2012-12-30: 25 mg via ORAL

## 2012-12-30 MED ORDER — ACETAMINOPHEN 325 MG PO TABS
650.0000 mg | ORAL_TABLET | Freq: Once | ORAL | Status: AC
  Administered 2012-12-30: 650 mg via ORAL

## 2012-12-30 MED ORDER — SODIUM CHLORIDE 0.9 % IV SOLN
250.0000 mL | Freq: Once | INTRAVENOUS | Status: AC
  Administered 2012-12-30: 250 mL via INTRAVENOUS

## 2012-12-30 MED ORDER — SODIUM CHLORIDE 0.9 % IJ SOLN
10.0000 mL | INTRAMUSCULAR | Status: AC | PRN
  Administered 2012-12-30: 10 mL
  Filled 2012-12-30: qty 10

## 2012-12-30 MED ORDER — HEPARIN SOD (PORK) LOCK FLUSH 100 UNIT/ML IV SOLN
500.0000 [IU] | Freq: Every day | INTRAVENOUS | Status: AC | PRN
  Administered 2012-12-30: 500 [IU]
  Filled 2012-12-30: qty 5

## 2012-12-30 NOTE — Telephone Encounter (Signed)
CBC/diff from 5/29 faxed to Dr Lowell Guitar at [facility]

## 2012-12-30 NOTE — Patient Instructions (Addendum)
Informacin sobre la transfusin de plaquetas (Platelet Transfusion Information) Esta es informacin acerca de la transfusin de plaquetas. Las plaquetas son pequeas clulas producidas en la mdula sea y que se encuentran en la sangre. Cuando un vaso sanguneo se daa, las plaquetas fluyen hacia la zona lesionada para formar un cogulo. Este procedimiento hace comenzar el proceso de curacin. Cuando el nivel de plaquetas es muy bajo en su sangre, podr tener problemas de coagulacin. Esto puede deberse a:  Public relations account executive.  Quimioterapia para tratar Science writer. A menudo, muchas veces los niveles bajos de plaquetas no causan problemas.  Las plaquetas pueden durar entre 7 y 35 das. Si no se utilizan en una lesin el bazo o el hgado las rompen. Los sntomas de un bajo recuento de plaquetas incluyen:  Hemorragias nasales.  Encas que sangran.  Abundante sangrado menstrual.  Hematomas y pequeos puntos rojos en la piel.  Los pequeos puntos de la piel se denominan petequias.  Grandes hematomas(prpura).  La hemorragia puede ser ms seria si sucede en el cerebro o el intestino. Las transfusiones de plaquetas a menudo se utilizan para Printmaker aceptable. Es poco comn que se produzca una hemorragia grave debido a la baja cantidad de plaquetas. RIESGOS Y COMPLICACIONES  No son frecuentes los efectos secundarios graves de [place]. Las reacciones menores pueden incluir:  Picazn  Erupciones  Temperaturas alta y escalofros. Existen medicamentos disponibles para [employer] a las transfusiones. Comente con el profesional que lo asiste si tiene alguno de [employer].  Si recibe transfusiones de plaquetas de manera frecuente pueden ser menos efectivas. Esto se denomina ser refractario a la plaquetas. Esto no es frecuente. Puede suceder por causas inmunes y no inmunes. Las causas no inmunes incluyen:  Fiebre alta.  Algunos  medicamentos.  Bazo agrandado. Las causas inmunes se producen cuando su organismo detecta que las plaquetas no le son propias y fabrican anticuerpos en contra de ellas. [place] plaquetas rpidamente. An con la transfusin de plaquetas usted podra continuar con problemas de sangrado o hematomas. Comunquele a sus mdicos si esto le sucede. Hay otras medidas para tomar si esto le sucede. ANTES DEL [place] controlar su nivel de plaquetas de forma regular.  Si el nivel es muy bajo ser necesario realizarle una transfusin.  Esto es ms importante antes de ciertos procedimiento con riesgo de hemorragia como una puncin espinal.  La transfusin de plaquetas reduce el riesgo de hemorragias durante o despus del procedimiento.  Excepto durante emergencias, la transfusin requiere un consentimiento escrito. Antes de extraer sangre de un donante, se tomar un historial completo para asegurarse que la persona no tiene antecedentes de enfermedades previas y no est presenta conductas sociales de [place]. Son ejemplo de estas ltimas el consumo de drogas por va intravenosa o la actividad sexual con mltiples parejas. Esto podra llevar a que fueran utilizados tanto sangre como sus derivados estando infectados. Esto se realiza incluso adems del exhaustivo control para asegurarse de que la sangre es segura. Todos los productos sanguneos que se transfunden se estudian para asegurarse de que son compatibles con la persona que la recibe. Tambin se controla para descartar la presencia de infecciones. Actualmente la sangre de transfusin es ms segura que nunca antes. El riesgo de infeccin es [place]. [place] plaquetas se almacenan en pequeas bolsas plsticas que se guardan a bajas temperaturas.  Cada bolsa se denomina unidad y a Clinical cytogeneticist se administran dos. Se  administran a travs de una gua intravenosa por goteo durante aproximadamente media hora.  Por lo  general la sangre se obtiene de muchas personas para tener la cantidad suficiente para transfundir.  A veces, las plaquetas se obtienen de Teacher, music. Esto se realiza mediante una mquina especial que separa las plaquetas de la [place]. Esta se denomina mquina de afresis. Las plaquetas obtenidas por esta va se denominan plaquetas por afresis. Las plaquetas por afresis reducen el riesgo de volverse sensible a las plaquetas. Esto disminuye las posibilidades de tener una reaccin a la transfusin.  Como lleva muy poco tiempo donar plaquetas, este tratamiento puede administrarse en un consultorio externo. Las plaquetas tambin pueden administrarse antes o despus de otros tratamientos. SOLICITE ATENCIN MDICA INMEDIATAMENTE SI: Aparecen algunos de los siguientes sntomas en las 12 horas o en los das siguientes:  Siente escalofros.  Sube la fiebre, con una temperatura superior a los 102 F (38.9 C)  Dolores de espalda o [employer] personas que lo rodean creen que usted no Secondary school teacher o lo ven confundido  Sangre al orinar o en la deposicin o hemorragia en cualquier parte del cuerpo.  Le falta la respiracin o presenta dificultades respiratorias.  Mareos.  Desmayos  Desarrolla un sarpullido o urticaria.  Disminuye la cantidad de [place], o sta toma un color oscuro o cambia hacia un tono rosado, rojo o [place].  Dolor de cabeza intenso o rigidez en el cuello.  Se le forman hematomas fcilmente. Document Released: 11/06/2008 Document Revised: 10/13/2011 [employer] Patient Information 2014 [place], [place].

## 2012-12-30 NOTE — Progress Notes (Signed)
This patient received a bag of pheresed platelets which should have been irradiated. The patient also received Tylenol 650 mg by mouth and Benadryl 25 mg by mouth as premeds.  Today the white count was 2.5. ANC was 0.8 Hemoglobin 11.2 Hematocrit 31.8 Platelets were 19,000.   We have received conflicting information from [facility]. We will need to clarify which limit for platelet transfusion they wish [place] to use, either 10,000 or 20,000.

## 2012-12-31 ENCOUNTER — Telehealth: Payer: Self-pay

## 2012-12-31 ENCOUNTER — Encounter: Payer: Self-pay | Admitting: Oncology

## 2012-12-31 LAB — PREPARE PLATELET PHERESIS

## 2012-12-31 NOTE — Telephone Encounter (Signed)
Faxed amylase and fibrinogen to [facility]. Spoke with Noreene Larsson and verified platelets to be given <20K.

## 2012-12-31 NOTE — Progress Notes (Signed)
Platelet transfusions, irradiated, are to be given whenever the platelet count is less than 20,000.  Contact person is [phone].

## 2013-01-03 ENCOUNTER — Other Ambulatory Visit (HOSPITAL_BASED_OUTPATIENT_CLINIC_OR_DEPARTMENT_OTHER): Payer: No Typology Code available for payment source | Admitting: Lab

## 2013-01-03 ENCOUNTER — Encounter: Payer: Self-pay | Admitting: Medical Oncology

## 2013-01-03 ENCOUNTER — Ambulatory Visit: Payer: Self-pay

## 2013-01-03 DIAGNOSIS — C9500 Acute leukemia of unspecified cell type not having achieved remission: Secondary | ICD-10-CM

## 2013-01-03 LAB — CBC WITH DIFFERENTIAL/PLATELET
Basophils Absolute: 0 10*3/uL (ref 0.0–0.1)
Eosinophils Absolute: 0 10*3/uL (ref 0.0–0.5)
HCT: 33.1 % — ABNORMAL LOW (ref 38.4–49.9)
HGB: 11.6 g/dL — ABNORMAL LOW (ref 13.0–17.1)
LYMPH%: 7.2 % — ABNORMAL LOW (ref 14.0–49.0)
MCV: 84.2 fL (ref 79.3–98.0)
MONO#: 0.5 10*3/uL (ref 0.1–0.9)
MONO%: 23.3 % — ABNORMAL HIGH (ref 0.0–14.0)
NEUT#: 1.5 10*3/uL (ref 1.5–6.5)
Platelets: 74 10*3/uL — ABNORMAL LOW (ref 140–400)
RBC: 3.94 10*6/uL — ABNORMAL LOW (ref 4.20–5.82)
WBC: 2.2 10*3/uL — ABNORMAL LOW (ref 4.0–10.3)

## 2013-01-03 LAB — AMYLASE: Amylase: 22 U/L (ref 0–105)

## 2013-01-03 LAB — COMPREHENSIVE METABOLIC PANEL (CC13)
Alkaline Phosphatase: 282 U/L — ABNORMAL HIGH (ref 40–150)
BUN: 22.2 mg/dL (ref 7.0–26.0)
CO2: 21 mEq/L — ABNORMAL LOW (ref 22–29)
Creatinine: 0.8 mg/dL (ref 0.7–1.3)
Glucose: 98 mg/dl (ref 70–99)
Sodium: 140 mEq/L (ref 136–145)
Total Bilirubin: 0.7 mg/dL (ref 0.20–1.20)
Total Protein: 5.9 g/dL — ABNORMAL LOW (ref 6.4–8.3)

## 2013-01-03 NOTE — Progress Notes (Signed)
Labs faxed to [facility] from 01/03/13

## 2013-01-03 NOTE — Progress Notes (Signed)
All counts adequate, no supportive treatment needed today.

## 2013-01-04 ENCOUNTER — Encounter (HOSPITAL_COMMUNITY)
Admission: RE | Admit: 2013-01-04 | Discharge: 2013-01-04 | Disposition: A | Discharge status: Home / Self Care | Payer: Self-pay | Source: Ambulatory Visit | Attending: Oncology | Admitting: Oncology

## 2013-01-04 DIAGNOSIS — C9200 Acute myeloblastic leukemia, not having achieved remission: Secondary | ICD-10-CM | POA: Insufficient documentation

## 2013-01-04 DIAGNOSIS — C9500 Acute leukemia of unspecified cell type not having achieved remission: Secondary | ICD-10-CM | POA: Insufficient documentation

## 2013-01-06 ENCOUNTER — Other Ambulatory Visit: Payer: Self-pay

## 2013-01-06 ENCOUNTER — Telehealth: Payer: Self-pay

## 2013-01-06 ENCOUNTER — Other Ambulatory Visit (HOSPITAL_BASED_OUTPATIENT_CLINIC_OR_DEPARTMENT_OTHER): Payer: No Typology Code available for payment source | Admitting: Lab

## 2013-01-06 DIAGNOSIS — C9500 Acute leukemia of unspecified cell type not having achieved remission: Secondary | ICD-10-CM

## 2013-01-06 LAB — CBC WITH DIFFERENTIAL/PLATELET
Basophils Absolute: 0 10*3/uL (ref 0.0–0.1)
EOS%: 0 % (ref 0.0–7.0)
Eosinophils Absolute: 0 10*3/uL (ref 0.0–0.5)
HGB: 10.9 g/dL — ABNORMAL LOW (ref 13.0–17.1)
LYMPH%: 19.3 % (ref 14.0–49.0)
MCH: 28.5 pg (ref 27.2–33.4)
MCV: 84.8 fL (ref 79.3–98.0)
MONO%: 23.7 % — ABNORMAL HIGH (ref 0.0–14.0)
Platelets: 103 10*3/uL — ABNORMAL LOW (ref 140–400)
RDW: 14.7 % — ABNORMAL HIGH (ref 11.0–14.6)

## 2013-01-06 LAB — FIBRINOGEN: Fibrinogen: 174 mg/dL — ABNORMAL LOW (ref 204–475)

## 2013-01-06 NOTE — Telephone Encounter (Signed)
Faxed labs from 01/06/13 to [facility]

## 2013-01-11 ENCOUNTER — Other Ambulatory Visit: Payer: Self-pay | Admitting: Medical Oncology

## 2013-01-11 DIAGNOSIS — C9500 Acute leukemia of unspecified cell type not having achieved remission: Secondary | ICD-10-CM

## 2013-01-12 ENCOUNTER — Telehealth: Payer: Self-pay | Admitting: Oncology

## 2013-01-13 ENCOUNTER — Encounter: Payer: Self-pay | Admitting: Oncology

## 2013-01-13 NOTE — Progress Notes (Signed)
We received a clinic note from Dr. Miachel Roux dated 12/28/2012. The patient received PEG asparaginase, course 5, day 15. The patient was also going to receive 2 units of packed red cells and platelets. White count was 2.7, hemoglobin 8.6, hematocrit 24.5 and platelets 4000. The plan was to reevaluate the patient on 01/11/2013 and probably admit him for course 6.

## 2013-01-18 ENCOUNTER — Telehealth: Payer: Self-pay | Admitting: Medical Oncology

## 2013-01-18 ENCOUNTER — Other Ambulatory Visit (HOSPITAL_BASED_OUTPATIENT_CLINIC_OR_DEPARTMENT_OTHER): Payer: No Typology Code available for payment source | Admitting: Lab

## 2013-01-18 DIAGNOSIS — C9500 Acute leukemia of unspecified cell type not having achieved remission: Secondary | ICD-10-CM

## 2013-01-18 LAB — COMPREHENSIVE METABOLIC PANEL (CC13)
ALT: 170 U/L — ABNORMAL HIGH (ref 0–55)
CO2: 24 mEq/L (ref 22–29)
Calcium: 8.1 mg/dL — ABNORMAL LOW (ref 8.4–10.4)
Chloride: 107 mEq/L (ref 98–107)
Creatinine: 0.9 mg/dL (ref 0.7–1.3)

## 2013-01-18 LAB — CBC WITH DIFFERENTIAL/PLATELET
BASO%: 0.9 % (ref 0.0–2.0)
EOS%: 0 % (ref 0.0–7.0)
HCT: 30.7 % — ABNORMAL LOW (ref 38.4–49.9)
LYMPH%: 12.7 % — ABNORMAL LOW (ref 14.0–49.0)
MCH: 30.4 pg (ref 27.2–33.4)
MCHC: 34.5 g/dL (ref 32.0–36.0)
MCV: 88 fL (ref 79.3–98.0)
MONO#: 0.1 10*3/uL (ref 0.1–0.9)
MONO%: 4.8 % (ref 0.0–14.0)
NEUT%: 81.6 % — ABNORMAL HIGH (ref 39.0–75.0)
Platelets: 76 10*3/uL — ABNORMAL LOW (ref 140–400)
RBC: 3.49 10*6/uL — ABNORMAL LOW (ref 4.20–5.82)
WBC: 1.8 10*3/uL — ABNORMAL LOW (ref 4.0–10.3)

## 2013-01-18 LAB — MAGNESIUM (CC13): Magnesium: 2 mg/dl (ref 1.5–2.5)

## 2013-01-18 NOTE — Telephone Encounter (Signed)
Labs faxed to [facility]

## 2013-01-23 ENCOUNTER — Encounter: Payer: Self-pay | Admitting: Oncology

## 2013-01-23 NOTE — Progress Notes (Signed)
We received additional communications from [facility], Dr. Miachel Roux. We have a followup visit dated 01/11/2013 prior to the patient's admission to the hospital. It was noted that CSF was negative on 12/14/2012.   We have a discharge summary from the admission of 01/11/2013 through 01/13/2013. 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 He has been receiving treatment according to CALGB 57846.  He was admitted for course 6 module C. Treatment consisted of the following:  -Methotrexate 1000 mg per meter squared IV in normal saline over 3 hours starting after the urine pH was greater than 6. -Vincristine 2 mg IV in  normal saline over 5 minutes on day 1. -Methotrexate 25 mg per meter squared by mouth every 6 hours x 4 doses starting 6.5 hours after administration of IV methotrexate. -Leucovorin 25 mg per meter squared IV in normal saline over 5-10 minutes starting 30.5 hours after administration of IV methotrexate. -Leucovorin 10 mg by mouth every 6 hours starting 42.5 hours after methotrexate administration. -IT methotrexate 15 mg in normal saline to 10 cc day 2.  The patient's ANC was 800 the day of admission. Per protocol, the patient's po methotrexate was dose reduced by 50%. The patient underwent an LP under fluoroscopic guidance with administration of IT methotrexate on 01/12/2013. A CSF sample was sent for the usual studies including cytology. The patient's 48-hour methotrexate level was 0.05.

## 2013-01-25 ENCOUNTER — Telehealth: Payer: Self-pay | Admitting: Oncology

## 2013-01-25 ENCOUNTER — Other Ambulatory Visit: Payer: Self-pay | Admitting: Medical Oncology

## 2013-01-25 DIAGNOSIS — C9500 Acute leukemia of unspecified cell type not having achieved remission: Secondary | ICD-10-CM

## 2013-02-01 ENCOUNTER — Telehealth: Payer: Self-pay

## 2013-02-01 ENCOUNTER — Other Ambulatory Visit (HOSPITAL_BASED_OUTPATIENT_CLINIC_OR_DEPARTMENT_OTHER): Payer: No Typology Code available for payment source | Admitting: Lab

## 2013-02-01 DIAGNOSIS — C9500 Acute leukemia of unspecified cell type not having achieved remission: Secondary | ICD-10-CM

## 2013-02-01 LAB — CBC WITH DIFFERENTIAL/PLATELET
BASO%: 0.6 % (ref 0.0–2.0)
Basophils Absolute: 0 10*3/uL (ref 0.0–0.1)
EOS%: 0.3 % (ref 0.0–7.0)
HGB: 9 g/dL — ABNORMAL LOW (ref 13.0–17.1)
MCH: 30.4 pg (ref 27.2–33.4)
MCHC: 34.4 g/dL (ref 32.0–36.0)
RBC: 2.97 10*6/uL — ABNORMAL LOW (ref 4.20–5.82)
RDW: 18.1 % — ABNORMAL HIGH (ref 11.0–14.6)
lymph#: 0.4 10*3/uL — ABNORMAL LOW (ref 0.9–3.3)

## 2013-02-01 LAB — COMPREHENSIVE METABOLIC PANEL (CC13)
ALT: 268 U/L — ABNORMAL HIGH (ref 0–55)
AST: 129 U/L — ABNORMAL HIGH (ref 5–34)
Alkaline Phosphatase: 171 U/L — ABNORMAL HIGH (ref 40–150)
CO2: 26 mEq/L (ref 22–29)
Creatinine: 0.7 mg/dL (ref 0.7–1.3)
Total Bilirubin: 0.88 mg/dL (ref 0.20–1.20)

## 2013-02-01 LAB — HOLD TUBE, BLOOD BANK

## 2013-02-01 LAB — MAGNESIUM (CC13): Magnesium: 1.9 mg/dl (ref 1.5–2.5)

## 2013-02-01 NOTE — Telephone Encounter (Signed)
Called [facility] with AST and ALT results, faxed labs to [facility] as well.

## 2013-02-01 NOTE — Telephone Encounter (Signed)
S/w Beverly at [facility] confirming when pt's next appt is there. She said it was 7/15. I asked if we need to do any other interim labs besides today. She will call back. I told pt he may go home, he does not need blood today.

## 2013-02-02 ENCOUNTER — Telehealth: Payer: Self-pay | Admitting: Oncology

## 2013-02-02 ENCOUNTER — Other Ambulatory Visit: Payer: Self-pay | Admitting: Medical Oncology

## 2013-02-02 DIAGNOSIS — C9500 Acute leukemia of unspecified cell type not having achieved remission: Secondary | ICD-10-CM

## 2013-02-02 NOTE — Telephone Encounter (Signed)
S/w pt re appt for 7/8 @ 10:45am.

## 2013-02-08 ENCOUNTER — Other Ambulatory Visit (HOSPITAL_BASED_OUTPATIENT_CLINIC_OR_DEPARTMENT_OTHER): Payer: No Typology Code available for payment source | Admitting: Lab

## 2013-02-08 DIAGNOSIS — C9500 Acute leukemia of unspecified cell type not having achieved remission: Secondary | ICD-10-CM

## 2013-02-08 LAB — COMPREHENSIVE METABOLIC PANEL (CC13)
Albumin: 3.5 g/dL (ref 3.5–5.0)
BUN: 11.3 mg/dL (ref 7.0–26.0)
Calcium: 9 mg/dL (ref 8.4–10.4)
Chloride: 110 mEq/L — ABNORMAL HIGH (ref 98–109)
Glucose: 90 mg/dl (ref 70–140)
Potassium: 3.7 mEq/L (ref 3.5–5.1)

## 2013-02-08 LAB — CBC WITH DIFFERENTIAL/PLATELET
Basophils Absolute: 0 10*3/uL (ref 0.0–0.1)
Eosinophils Absolute: 0 10*3/uL (ref 0.0–0.5)
HGB: 9.5 g/dL — ABNORMAL LOW (ref 13.0–17.1)
MCV: 92.9 fL (ref 79.3–98.0)
MONO%: 9.4 % (ref 0.0–14.0)
NEUT#: 3.4 10*3/uL (ref 1.5–6.5)
RDW: 20.7 % — ABNORMAL HIGH (ref 11.0–14.6)
lymph#: 0.7 10*3/uL — ABNORMAL LOW (ref 0.9–3.3)

## 2013-02-08 LAB — MAGNESIUM (CC13): Magnesium: 2 mg/dl (ref 1.5–2.5)

## 2013-02-16 ENCOUNTER — Telehealth: Payer: Self-pay | Admitting: Hematology and Oncology

## 2013-02-16 ENCOUNTER — Other Ambulatory Visit: Payer: Self-pay | Admitting: Medical Oncology

## 2013-02-16 DIAGNOSIS — C9500 Acute leukemia of unspecified cell type not having achieved remission: Secondary | ICD-10-CM

## 2013-02-16 NOTE — Telephone Encounter (Signed)
Added lb 7/22 @ 9:45am. Per desk nurse pt currently in hosp and she will call [facility] w/appt d/t.

## 2013-02-16 NOTE — Telephone Encounter (Signed)
Maureen Ralphs- nurse from [facility] faxed orders for labs to be drawn 02/22/13. Pt is currently hospitalized and she will give him the time. I called her with 02/22/13 at 945 am.

## 2013-02-22 ENCOUNTER — Other Ambulatory Visit (HOSPITAL_BASED_OUTPATIENT_CLINIC_OR_DEPARTMENT_OTHER): Payer: No Typology Code available for payment source | Admitting: Lab

## 2013-02-22 DIAGNOSIS — C9500 Acute leukemia of unspecified cell type not having achieved remission: Secondary | ICD-10-CM

## 2013-02-22 LAB — CBC WITH DIFFERENTIAL/PLATELET
BASO%: 0.4 % (ref 0.0–2.0)
Eosinophils Absolute: 0.1 10*3/uL (ref 0.0–0.5)
HCT: 24.5 % — ABNORMAL LOW (ref 38.4–49.9)
LYMPH%: 31.5 % (ref 14.0–49.0)
MONO#: 0.1 10*3/uL (ref 0.1–0.9)
NEUT#: 1.1 10*3/uL — ABNORMAL LOW (ref 1.5–6.5)
NEUT%: 62 % (ref 39.0–75.0)
Platelets: 32 10*3/uL — ABNORMAL LOW (ref 140–400)
RBC: 2.62 10*6/uL — ABNORMAL LOW (ref 4.20–5.82)
WBC: 1.7 10*3/uL — ABNORMAL LOW (ref 4.0–10.3)
lymph#: 0.5 10*3/uL — ABNORMAL LOW (ref 0.9–3.3)

## 2013-02-22 LAB — COMPREHENSIVE METABOLIC PANEL (CC13)
ALT: 114 U/L — ABNORMAL HIGH (ref 0–55)
CO2: 24 mEq/L (ref 22–29)
Calcium: 9 mg/dL (ref 8.4–10.4)
Chloride: 108 mEq/L (ref 98–109)
Glucose: 98 mg/dl (ref 70–140)
Sodium: 139 mEq/L (ref 136–145)
Total Bilirubin: 0.64 mg/dL (ref 0.20–1.20)
Total Protein: 6.4 g/dL (ref 6.4–8.3)

## 2013-02-22 LAB — MAGNESIUM (CC13): Magnesium: 2.2 mg/dl (ref 1.5–2.5)

## 2013-02-23 ENCOUNTER — Other Ambulatory Visit: Payer: Self-pay | Admitting: Medical Oncology

## 2013-02-23 ENCOUNTER — Telehealth: Payer: Self-pay | Admitting: Medical Oncology

## 2013-02-23 DIAGNOSIS — C9500 Acute leukemia of unspecified cell type not having achieved remission: Secondary | ICD-10-CM

## 2013-02-23 NOTE — Telephone Encounter (Signed)
I called [facility] and spoke with triage nurse to inform her that I did not review the results until 5pm. I gave her the counts and and explained that we do not have any appointments to transfuse blood until Friday. She is going to check to see if they want him transfused today or to recheck labs on Friday and possible transfuse. She is going to call me back.

## 2013-02-25 ENCOUNTER — Ambulatory Visit: Payer: No Typology Code available for payment source | Admitting: Lab

## 2013-02-25 ENCOUNTER — Telehealth: Payer: Self-pay | Admitting: Medical Oncology

## 2013-02-25 ENCOUNTER — Encounter: Payer: Self-pay | Admitting: Medical Oncology

## 2013-02-25 ENCOUNTER — Other Ambulatory Visit: Payer: Self-pay | Admitting: Medical Oncology

## 2013-02-25 NOTE — Telephone Encounter (Signed)
I called emergency contact (pt's cousin) with the telephonic interpreting to see if we could contact the pt. I explained that we have been trying to reach the pt to come in for his labs and no answer at home. She was going to try and reach him and have him come to cancer center. I will notify [facility] that we have not been able to contact the pt for labs.

## 2013-02-25 NOTE — Telephone Encounter (Signed)
Pt needs lb and poss PRBC's today and 7/28. Still not able to reach pt. Made several calls and left messages with no response. Desk nurse is aware and has been able to reach a family member who is trying to locate pt. Should pt come in today lb appt will be added and pt will be given appt for 7/28.

## 2013-02-25 NOTE — Telephone Encounter (Signed)
I called Wayne Grimes at [facility] and left her a message that we have not been able to contact pt about his labs appts for today and 7/28. I informed her that I had called his emergency contact with the interpreting service and his cousin was going to try and reach him. I asked her to call me if she has any other contact numbers.

## 2013-02-28 ENCOUNTER — Other Ambulatory Visit: Payer: Self-pay

## 2013-02-28 ENCOUNTER — Ambulatory Visit (HOSPITAL_BASED_OUTPATIENT_CLINIC_OR_DEPARTMENT_OTHER): Payer: No Typology Code available for payment source

## 2013-02-28 ENCOUNTER — Other Ambulatory Visit (HOSPITAL_BASED_OUTPATIENT_CLINIC_OR_DEPARTMENT_OTHER): Payer: No Typology Code available for payment source | Admitting: Lab

## 2013-02-28 ENCOUNTER — Encounter (HOSPITAL_COMMUNITY)
Admission: RE | Admit: 2013-02-28 | Discharge: 2013-02-28 | Disposition: A | Discharge status: Home / Self Care | Payer: No Typology Code available for payment source | Source: Ambulatory Visit | Attending: Oncology | Admitting: Oncology

## 2013-02-28 VITALS — BP 128/78 | HR 68 | Temp 97.0°F | Resp 20

## 2013-02-28 DIAGNOSIS — D649 Anemia, unspecified: Secondary | ICD-10-CM

## 2013-02-28 DIAGNOSIS — C9100 Acute lymphoblastic leukemia not having achieved remission: Secondary | ICD-10-CM

## 2013-02-28 DIAGNOSIS — C9500 Acute leukemia of unspecified cell type not having achieved remission: Secondary | ICD-10-CM

## 2013-02-28 LAB — COMPREHENSIVE METABOLIC PANEL (CC13)
AST: 25 U/L (ref 5–34)
Albumin: 3.4 g/dL — ABNORMAL LOW (ref 3.5–5.0)
BUN: 11.8 mg/dL (ref 7.0–26.0)
Calcium: 8.5 mg/dL (ref 8.4–10.4)
Chloride: 111 mEq/L — ABNORMAL HIGH (ref 98–109)
Creatinine: 0.7 mg/dL (ref 0.7–1.3)
Glucose: 100 mg/dl (ref 70–140)
Potassium: 3.7 mEq/L (ref 3.5–5.1)

## 2013-02-28 LAB — CBC WITH DIFFERENTIAL/PLATELET
BASO%: 0.4 % (ref 0.0–2.0)
Basophils Absolute: 0 10*3/uL (ref 0.0–0.1)
EOS%: 2.9 % (ref 0.0–7.0)
HCT: 24.6 % — ABNORMAL LOW (ref 38.4–49.9)
HGB: 8.6 g/dL — ABNORMAL LOW (ref 13.0–17.1)
LYMPH%: 19.1 % (ref 14.0–49.0)
MCH: 33.5 pg — ABNORMAL HIGH (ref 27.2–33.4)
MCHC: 34.9 g/dL (ref 32.0–36.0)
MONO#: 0.1 10*3/uL (ref 0.1–0.9)
NEUT%: 72.3 % (ref 39.0–75.0)
Platelets: 30 10*3/uL — ABNORMAL LOW (ref 140–400)
lymph#: 0.4 10*3/uL — ABNORMAL LOW (ref 0.9–3.3)

## 2013-02-28 MED ORDER — ACETAMINOPHEN 325 MG PO TABS
650.0000 mg | ORAL_TABLET | Freq: Once | ORAL | Status: AC
  Administered 2013-02-28: 650 mg via ORAL

## 2013-02-28 MED ORDER — DIPHENHYDRAMINE HCL 25 MG PO CAPS
25.0000 mg | ORAL_CAPSULE | Freq: Once | ORAL | Status: AC
  Administered 2013-02-28: 25 mg via ORAL

## 2013-02-28 MED ORDER — HEPARIN SOD (PORK) LOCK FLUSH 100 UNIT/ML IV SOLN
500.0000 [IU] | Freq: Every day | INTRAVENOUS | Status: AC | PRN
  Administered 2013-02-28: 500 [IU]
  Filled 2013-02-28: qty 5

## 2013-02-28 MED ORDER — SODIUM CHLORIDE 0.9 % IV SOLN
250.0000 mL | Freq: Once | INTRAVENOUS | Status: AC
  Administered 2013-02-28: 250 mL via INTRAVENOUS

## 2013-02-28 MED ORDER — SODIUM CHLORIDE 0.9 % IJ SOLN
10.0000 mL | INTRAMUSCULAR | Status: AC | PRN
  Administered 2013-02-28: 10 mL
  Filled 2013-02-28: qty 10

## 2013-03-01 LAB — TYPE AND SCREEN
Antibody Screen: NEGATIVE
Unit division: 0

## 2013-03-02 ENCOUNTER — Other Ambulatory Visit: Payer: Self-pay

## 2013-03-02 DIAGNOSIS — C9500 Acute leukemia of unspecified cell type not having achieved remission: Secondary | ICD-10-CM

## 2013-03-03 ENCOUNTER — Telehealth: Payer: Self-pay | Admitting: Hematology and Oncology

## 2013-03-03 ENCOUNTER — Telehealth: Payer: Self-pay | Admitting: *Deleted

## 2013-03-03 NOTE — Telephone Encounter (Signed)
S/w pt/friend re appts for 8/1 and 8/4. lmonvm and staff message to chemo scheduler to add poss PRBC's for 8/1 and 8/4 after lab.

## 2013-03-03 NOTE — Telephone Encounter (Signed)
Per staff message and POF I have scheduled appts.  JMW  

## 2013-03-04 ENCOUNTER — Telehealth: Payer: Self-pay

## 2013-03-04 ENCOUNTER — Ambulatory Visit: Payer: No Typology Code available for payment source

## 2013-03-04 ENCOUNTER — Encounter (HOSPITAL_COMMUNITY)
Admission: RE | Admit: 2013-03-04 | Discharge: 2013-03-04 | Disposition: A | Discharge status: Home / Self Care | Payer: No Typology Code available for payment source | Source: Ambulatory Visit | Attending: Oncology | Admitting: Oncology

## 2013-03-04 ENCOUNTER — Other Ambulatory Visit (HOSPITAL_BASED_OUTPATIENT_CLINIC_OR_DEPARTMENT_OTHER): Payer: No Typology Code available for payment source

## 2013-03-04 DIAGNOSIS — C9500 Acute leukemia of unspecified cell type not having achieved remission: Secondary | ICD-10-CM

## 2013-03-04 DIAGNOSIS — C9100 Acute lymphoblastic leukemia not having achieved remission: Secondary | ICD-10-CM | POA: Insufficient documentation

## 2013-03-04 LAB — CBC WITH DIFFERENTIAL/PLATELET
Basophils Absolute: 0 10*3/uL (ref 0.0–0.1)
Eosinophils Absolute: 0 10*3/uL (ref 0.0–0.5)
HCT: 31.4 % — ABNORMAL LOW (ref 38.4–49.9)
HGB: 10.6 g/dL — ABNORMAL LOW (ref 13.0–17.1)
MCH: 30.8 pg (ref 27.2–33.4)
MCV: 91.3 fL (ref 79.3–98.0)
MONO%: 21.1 % — ABNORMAL HIGH (ref 0.0–14.0)
NEUT#: 0.3 10*3/uL — CL (ref 1.5–6.5)
NEUT%: 26.9 % — ABNORMAL LOW (ref 39.0–75.0)
RDW: 20.1 % — ABNORMAL HIGH (ref 11.0–14.6)
lymph#: 0.6 10*3/uL — ABNORMAL LOW (ref 0.9–3.3)

## 2013-03-04 LAB — HOLD TUBE, BLOOD BANK

## 2013-03-04 NOTE — Progress Notes (Signed)
Pt did not need blood today based on parameters from [facility]. Reminded patient/interpretor to take bactrim M/W/F and reminded of neutropenic precautions

## 2013-03-04 NOTE — Telephone Encounter (Signed)
Faxed 8/1 CBC/diff to [facility]

## 2013-03-07 ENCOUNTER — Telehealth: Payer: Self-pay | Admitting: Medical Oncology

## 2013-03-07 ENCOUNTER — Other Ambulatory Visit (HOSPITAL_BASED_OUTPATIENT_CLINIC_OR_DEPARTMENT_OTHER): Payer: No Typology Code available for payment source | Admitting: Lab

## 2013-03-07 DIAGNOSIS — C9500 Acute leukemia of unspecified cell type not having achieved remission: Secondary | ICD-10-CM

## 2013-03-07 LAB — CBC WITH DIFFERENTIAL/PLATELET
Basophils Absolute: 0 10*3/uL (ref 0.0–0.1)
Eosinophils Absolute: 0 10*3/uL (ref 0.0–0.5)
HCT: 33.5 % — ABNORMAL LOW (ref 38.4–49.9)
HGB: 11.1 g/dL — ABNORMAL LOW (ref 13.0–17.1)
LYMPH%: 61.9 % — ABNORMAL HIGH (ref 14.0–49.0)
MONO#: 0.3 10*3/uL (ref 0.1–0.9)
NEUT%: 16.8 % — ABNORMAL LOW (ref 39.0–75.0)
Platelets: 71 10*3/uL — ABNORMAL LOW (ref 140–400)
WBC: 1.6 10*3/uL — ABNORMAL LOW (ref 4.0–10.3)
lymph#: 1 10*3/uL (ref 0.9–3.3)

## 2013-03-07 NOTE — Telephone Encounter (Signed)
Lab results faxed to [facility]

## 2013-03-09 ENCOUNTER — Telehealth: Payer: Self-pay | Admitting: Hematology and Oncology

## 2013-03-09 ENCOUNTER — Other Ambulatory Visit: Payer: Self-pay | Admitting: Medical Oncology

## 2013-03-09 DIAGNOSIS — C9500 Acute leukemia of unspecified cell type not having achieved remission: Secondary | ICD-10-CM

## 2013-03-09 NOTE — Telephone Encounter (Signed)
S/w pt re appt for 8/7 @ 8:30am.

## 2013-03-10 ENCOUNTER — Other Ambulatory Visit (HOSPITAL_BASED_OUTPATIENT_CLINIC_OR_DEPARTMENT_OTHER): Payer: No Typology Code available for payment source

## 2013-03-10 DIAGNOSIS — C9500 Acute leukemia of unspecified cell type not having achieved remission: Secondary | ICD-10-CM

## 2013-03-10 LAB — CBC WITH DIFFERENTIAL/PLATELET
Basophils Absolute: 0 10*3/uL (ref 0.0–0.1)
Eosinophils Absolute: 0 10*3/uL (ref 0.0–0.5)
HCT: 33.7 % — ABNORMAL LOW (ref 38.4–49.9)
HGB: 11.7 g/dL — ABNORMAL LOW (ref 13.0–17.1)
LYMPH%: 44 % (ref 14.0–49.0)
MCV: 93.5 fL (ref 79.3–98.0)
MONO%: 13.3 % (ref 0.0–14.0)
NEUT#: 0.8 10*3/uL — ABNORMAL LOW (ref 1.5–6.5)
NEUT%: 40.6 % (ref 39.0–75.0)
Platelets: 92 10*3/uL — ABNORMAL LOW (ref 140–400)
RDW: 22.1 % — ABNORMAL HIGH (ref 11.0–14.6)

## 2013-03-17 ENCOUNTER — Other Ambulatory Visit: Payer: Self-pay | Admitting: Medical Oncology

## 2013-03-17 DIAGNOSIS — C9500 Acute leukemia of unspecified cell type not having achieved remission: Secondary | ICD-10-CM

## 2013-03-18 ENCOUNTER — Telehealth: Payer: Self-pay | Admitting: Hematology and Oncology

## 2013-03-29 ENCOUNTER — Other Ambulatory Visit (HOSPITAL_BASED_OUTPATIENT_CLINIC_OR_DEPARTMENT_OTHER): Payer: No Typology Code available for payment source | Admitting: Lab

## 2013-03-29 DIAGNOSIS — C9500 Acute leukemia of unspecified cell type not having achieved remission: Secondary | ICD-10-CM

## 2013-03-29 LAB — COMPREHENSIVE METABOLIC PANEL (CC13)
Alkaline Phosphatase: 123 U/L (ref 40–150)
BUN: 11.1 mg/dL (ref 7.0–26.0)
Glucose: 98 mg/dl (ref 70–140)
Sodium: 142 mEq/L (ref 136–145)
Total Bilirubin: 0.62 mg/dL (ref 0.20–1.20)
Total Protein: 6.5 g/dL (ref 6.4–8.3)

## 2013-03-29 LAB — CBC WITH DIFFERENTIAL/PLATELET
Eosinophils Absolute: 0.1 10*3/uL (ref 0.0–0.5)
LYMPH%: 20.3 % (ref 14.0–49.0)
MCH: 32.6 pg (ref 27.2–33.4)
MCV: 94.2 fL (ref 79.3–98.0)
MONO%: 8.7 % (ref 0.0–14.0)
NEUT#: 2.4 10*3/uL (ref 1.5–6.5)
Platelets: 138 10*3/uL — ABNORMAL LOW (ref 140–400)
RBC: 3.82 10*6/uL — ABNORMAL LOW (ref 4.20–5.82)

## 2013-03-29 LAB — MAGNESIUM (CC13): Magnesium: 1.8 mg/dl (ref 1.5–2.5)

## 2013-05-12 ENCOUNTER — Encounter: Payer: Self-pay | Admitting: Medical Oncology

## 2013-11-15 ENCOUNTER — Emergency Department (HOSPITAL_COMMUNITY)
Admission: EM | Admit: 2013-11-15 | Discharge: 2013-11-15 | Disposition: A | Discharge status: Home / Self Care | Payer: No Typology Code available for payment source | Attending: Emergency Medicine | Admitting: Emergency Medicine

## 2013-11-15 DIAGNOSIS — M545 Low back pain, unspecified: Secondary | ICD-10-CM | POA: Insufficient documentation

## 2013-11-15 DIAGNOSIS — Z87891 Personal history of nicotine dependence: Secondary | ICD-10-CM | POA: Insufficient documentation

## 2013-11-15 DIAGNOSIS — M549 Dorsalgia, unspecified: Secondary | ICD-10-CM

## 2013-11-15 MED ORDER — CYCLOBENZAPRINE HCL 10 MG PO TABS: 10.0000 mg | ORAL_TABLET | Freq: Once | ORAL | Status: DC

## 2013-11-15 MED ORDER — CYCLOBENZAPRINE HCL 10 MG PO TABS: 10.0000 mg | ORAL_TABLET | Freq: Two times a day (BID) | ORAL | Status: DC | PRN

## 2013-11-15 MED ORDER — NAPROXEN 500 MG PO TABS: 500.0000 mg | ORAL_TABLET | Freq: Two times a day (BID) | ORAL | Status: DC

## 2013-11-15 MED ORDER — NAPROXEN 250 MG PO TABS: 500.0000 mg | ORAL_TABLET | Freq: Once | ORAL | Status: DC

## 2013-11-15 NOTE — ED Notes (Signed)
Noted at nurses station - computer in patient room not working.

## 2013-11-15 NOTE — ED Notes (Signed)
Patient states he has lower back pain that radiates into R leg.   Patient states this started on Saturday and denies injury.

## 2013-11-15 NOTE — ED Provider Notes (Signed)
CSN: [id number]     Arrival date & time 11/15/13  3818 History  This chart was scribed for non-physician practitioner Alvina Chou, PA-C working with Shaune Pollack, MD by Ludger Nutting, ED Scribe. This patient was seen in room TR08C/TR08C and the patient's care was started at 9:12 AM.      Chief Complaint  Patient presents with  . Back Pain      The history is provided by the patient and the spouse. No language interpreter was used.   HPI Comments: Wayne Grimes is a 28 y.o. male who presents to the Emergency Department complaining of 3 days of constant low back pain that radiates down the right leg. He describes the pain as aching and severe. Patient denies any recent falls or injuries. He has been taking 400 mg ibuprofen without relief. He is able to ambulate but states associated pain. He denies numbness, weakness.   No past medical history on file. No past surgical history on file. No family history on file. History  Substance Use Topics  . Smoking status: Former Smoker -- 0.10 packs/day for 2 years    Types: Cigarettes    Quit date: 06/03/2011  . Smokeless tobacco: Never Used  . Alcohol Use: 1.8 oz/week    3 Cans of beer per week    Review of Systems  Musculoskeletal: Positive for back pain. Negative for gait problem.  Neurological: Negative for weakness and numbness.  All other systems reviewed and are negative.     Allergies  Orange juice  Home Medications   Prior to Admission medications   Medication Sig Start Date End Date Taking? Authorizing Provider  ibuprofen (ADVIL,MOTRIN) 200 MG tablet Take 400 mg by mouth every 6 (six) hours as needed.   Yes Historical Provider, MD   BP 144/94  Pulse 85  Temp(Src) 97.5 F (36.4 C) (Oral)  Resp 18  SpO2 100% Physical Exam  Nursing note and vitals reviewed. Constitutional: He is oriented to person, place, and time. He appears well-developed and well-nourished.  HENT:  Head: Normocephalic and atraumatic.   Cardiovascular: Normal rate.   Pulmonary/Chest: Effort normal.  Abdominal: He exhibits no distension.  Musculoskeletal:  Right lumbar paraspinal tenderness to palpation. Right gluteal tenderness to palpation.   Neurological: He is alert and oriented to person, place, and time. He has normal strength. No sensory deficit.  Lower extremity strength and sensation equal and intact bilaterally.  Skin: Skin is warm and dry.  Psychiatric: He has a normal mood and affect.    ED Course  Procedures (including critical care time)  DIAGNOSTIC STUDIES: Oxygen Saturation is 100% on RA, normal by my interpretation.    COORDINATION OF CARE: 9:12 AM Will prescribe naproxen and Flexeril. Discussed treatment plan with pt at bedside and pt agreed to plan.   Labs Review Labs Reviewed - No data to display  Imaging Review No results found.   EKG Interpretation None      MDM   Final diagnoses:  Back pain    Patient likely has sciatica vs. Muscle strain. Patient will be discharged with Flexeril and naprosyn. Patient advised to follow up with PCP. Vitals stable and patient afebrile. No bladder/bowel incontinence or saddle paresthesias.   I personally performed the services described in this documentation, which was scribed in my presence. The recorded information has been reviewed and is accurate.   Alvina Chou, PA-C 11/15/13 [id number]

## 2013-11-15 NOTE — Discharge Instructions (Signed)
Take naprosyn for pain. Take Flexeril for muscle spasm. You can take these medications together. Refer to attached documents for more information. Make an appointment to see your doctor.   Tome Naprosyn para Conservation officer, historic buildings. Tome Flexeril para el espasmo muscular. Usted puede tomar [employer] juntos. Consulte los documentos adjuntos para ms informacin. Wayne Grimes cita para ver a su mdico .

## 2013-11-16 ENCOUNTER — Emergency Department (HOSPITAL_COMMUNITY)
Admission: EM | Admit: 2013-11-16 | Discharge: 2013-11-16 | Disposition: A | Discharge status: Home / Self Care | Payer: No Typology Code available for payment source | Attending: Emergency Medicine | Admitting: Emergency Medicine

## 2013-11-16 ENCOUNTER — Encounter (HOSPITAL_COMMUNITY): Payer: Self-pay | Admitting: Emergency Medicine

## 2013-11-16 DIAGNOSIS — Z856 Personal history of leukemia: Secondary | ICD-10-CM | POA: Insufficient documentation

## 2013-11-16 DIAGNOSIS — M5430 Sciatica, unspecified side: Secondary | ICD-10-CM

## 2013-11-16 DIAGNOSIS — M545 Low back pain, unspecified: Secondary | ICD-10-CM | POA: Insufficient documentation

## 2013-11-16 DIAGNOSIS — Z87891 Personal history of nicotine dependence: Secondary | ICD-10-CM | POA: Insufficient documentation

## 2013-11-16 DIAGNOSIS — IMO0002 Reserved for concepts with insufficient information to code with codable children: Secondary | ICD-10-CM | POA: Insufficient documentation

## 2013-11-16 MED ORDER — IBUPROFEN 800 MG PO TABS: 800.0000 mg | ORAL_TABLET | Freq: Three times a day (TID) | ORAL | Status: DC

## 2013-11-16 MED ORDER — DEXAMETHASONE SODIUM PHOSPHATE 10 MG/ML IJ SOLN
10.0000 mg | Freq: Once | INTRAMUSCULAR | Status: AC
  Administered 2013-11-16: 10 mg via INTRAMUSCULAR
  Filled 2013-11-16: qty 1

## 2013-11-16 MED ORDER — HYDROCODONE-ACETAMINOPHEN 5-325 MG PO TABS: 1.0000 | ORAL_TABLET | ORAL | Status: DC | PRN

## 2013-11-16 MED ORDER — PREDNISONE 20 MG PO TABS: 40.0000 mg | ORAL_TABLET | Freq: Every day | ORAL | Status: DC

## 2013-11-16 NOTE — ED Notes (Signed)
Pt. reports persistent pain at right buttock denies injury , seen here yesterday prescribed with Naproxen and Flexeril with no relief . Ambulatory .

## 2013-11-16 NOTE — ED Provider Notes (Signed)
History/physical exam/procedure(s) were performed by non-physician practitioner and as supervising physician I was immediately available for consultation/collaboration. I have reviewed all notes and am in agreement with care and plan.   Shaune Pollack, MD 11/16/13 [phone]

## 2013-11-16 NOTE — Discharge Instructions (Signed)
Call for a follow up appointment with a Family or Primary Care Provider.  Stop taking the Naproxen and Ibuprofen until you finished the prednisone.   You can start taking Ibuprofen 800 mg 3 times a day or the Naproxen 500 mg 2 times a day, once you have completed the prednisone.  Do not take two of these medications at once. Return if Symptoms worsen.   Take medication as prescribed.   Emergency Department Resource Guide 1) Find a Doctor and Pay Out of Pocket Although you won't have to find out who is covered by your insurance plan, it is a good idea to ask around and get recommendations. You will then need to call the office and see if the doctor you have chosen will accept you as a new patient and what types of options they offer for patients who are self-pay. Some doctors offer discounts or will set up payment plans for their patients who do not have insurance, but you will need to ask so you aren't surprised when you get to your appointment.  2) Contact Your Local Health Department Not all health departments have doctors that can see patients for sick visits, but many do, so it is worth a call to see if yours does. If you don't know where your local health department is, you can check in your phone book. The CDC also has a tool to help you locate your state's health department, and many state websites also have listings of all of their local health departments.  3) Find a [facility] If your illness is not likely to be very severe or complicated, you may want to try a walk in clinic. These are popping up all over the country in pharmacies, drugstores, and shopping centers. They're usually staffed by nurse practitioners or physician assistants that have been trained to treat common illnesses and complaints. They're usually fairly quick and inexpensive. However, if you have serious medical issues or chronic medical problems, these are probably not your best option.  No Primary Care  Doctor: - Call Health Connect at  [phone] - they can help you locate a primary care doctor that  accepts your insurance, provides certain services, etc. - Physician Referral Service- [phone]  Chronic Pain Problems: Organization         Address  Phone   Notes  [facility]  [phone] Patients need to be referred by their primary care doctor.   Medication Assistance: Organization         Address  Phone   Notes  [facility] Medication [facility] [place], [place] [address] [phone] --Must be a resident of [facility] -- Must have NO insurance coverage whatsoever (no Medicaid/ Medicare, etc.) -- The pt. MUST have a primary care doctor that directs their care regularly and follows them in the community   [facility]ist  [phone]   [employer]  [phone]    Agencies that provide inexpensive medical care: Organization         Address  Phone   Notes  [place]  [phone]   [facility]    [phone]   [facility] [place], [place] [address] [phone]   [place] [address], [place] [phone]   Planned Parenthood    [phone]   [facility]    [phone]   Community  Health and [place]  [address], [place] Phone:  [phone], Fax:  [phone] Hours of Operation:  9 am - 6 pm, M-F.  Also accepts Medicaid/Medicare and self-pay.  [facility] for [place], [address], [place] Phone: [phone], Fax: [phone]. Hours of Operation:  8:30 am - 5:30 pm, M-F.  Also accepts Medicaid and self-pay.  [facility] [place] [address], [place] Phone: [phone]   [place], [place], [place] [phone], Ext. [phone] Mondays & Thursdays: 7-9 AM.  First 15 patients are seen on a first  come, first serve basis.    [place] Providers:  Organization         Address  Phone   Notes  [facility] [address],  [phone] Also accepts self-pay patients.  [facility] [address], [place]  [phone]   [place], [address], [place] [phone]   [facility] [address] [place] [phone]   Lucianne Lei [address], [place]   [phone] Only accepts [place] Access [place] patients after they have their name applied to their card.   Self-Pay (no insurance) in [facility]:  Organization         Address  Phone   Notes  Sickle Cell Patients, [facility] [place] [phone]   [facility] [place] [phone]   [facility] [place]  [place], [place], [place] [phone]   [facility]/Dr. Osei-Bonsu  [address] [place] or [address], [place] [phone] Phone number for both [place] and [place] locations is the same.  Urgent Medical and [facility] [address], [place] [phone]   [facility] [address], [place] or [address] [phone]   [facility] [address], [place] [phone], phone; [phone], fax Sees patients 1st and 3rd Saturday of every month.  Must not qualify for public or private insurance (i.e. Medicaid, Medicare, [place] Health Choice, Veterans' Benefits)  Household income should be no more than 200% of the poverty level The clinic cannot treat you if you are pregnant or think you are pregnant  Sexually transmitted diseases are not treated at the clinic.    Dental Care: Organization          Address  Phone  Notes  [facility] Department of [facility] [phone] Accepts children up to age 32 who are enrolled in [place] or [place]; pregnant women with a Medicaid card; and children who have applied for Medicaid or [place] Health Choice, but were declined, whose parents can pay a reduced fee at time of service.  [facility] Department of [facility]  [address], [place] [phone] Accepts children up to age 25 who are enrolled in [place] or [place]; pregnant women with a Medicaid card; and children who have applied for Medicaid or [place] Health Choice, but were declined, whose parents can pay a reduced fee at time of service.  [place] Adult Dental Access PROGRAM  [phone]  [place], [place] [phone] Patients are seen by appointment only. Walk-ins are not accepted. [place] will see patients 32 years of age and older. Monday - Tuesday (8am-5pm) Most Wednesdays (8:30-5pm) $30 per visit, cash only  [facility] Adult Dental Access PROGRAM  [address], [facility] [phone] Patients are seen by appointment only. Walk-ins are not accepted. [place] will see patients 24 years of age and older. One Wednesday Evening (Monthly: Volunteer Based).  $30 per visit, cash only  [place]  [phone] for adults; Children under age 28, call Graduate Pediatric Dentistry at [phone]. Children aged 75-14, please call [phone] to request a pediatric application.  Dental services are provided in all areas of dental care including fillings, crowns and bridges, complete and partial dentures, implants, gum treatment, root canals, and extractions. Preventive care is also provided. Treatment is provided to both adults and children. Patients are selected via a lottery and there is often a waiting list.   [facility] [address], [place]  [phone] [email or web address]   [facility] [address] [place], [place] [phone], Ext. [phone] Second and Fourth Thursday of each month, opens at 6:30 AM; Clinic ends at 9 AM.  Patients are seen on a first-come first-served basis, and a limited number are seen during each clinic.   [facility]  [address] Hillard Danker [place], [place] [phone]   Eligibility Requirements You must have lived in [place], [place], or [place] counties for at least the last three months.   You cannot be eligible for state or federal sponsored [employer], including [employer], [place], or [employer].   You generally cannot be eligible for healthcare insurance through your employer.    How to apply: Eligibility screenings are held every Tuesday and Wednesday afternoon from 1:00 pm until 4:00 pm. You do not need an appointment for the interview!  [facility] [address] [place], [place]   [place]  [place] Department  [place]  [phone]    Behavioral Health Resources in the Community: Intensive Outpatient Programs Organization         Address  Phone  Notes  [place]. [address] [place], [place] [phone]   [facility] Outpatient [address], [place], [place]   ADS: Alcohol & Drug Svcs [address], [place], [place]   [place] [address]  [place], [place] or [phone]   Substance Abuse Resources Organization         Address  Phone  Notes  Alcohol and Drug Services  [phone]   [place]  [phone]   The [place]   Chinita Pester  [phone]   Residential & Outpatient Substance Abuse Program  [phone]   Psychological  Services Organization         Address  Phone  Notes  [facility] [place]  [place]  [phone]   [place] [address] [place] or [phone]    Mobile Crisis Teams Organization         Address  Phone  Notes  [facility], Mobile Crisis Care Unit  [phone]   Assertive Psychotherapeutic Services  [address] [place], [place]   Ivin Booty  [place] [phone]    Self-Help/Support Groups Organization         Address  Phone             Notes  Mental Health Assoc. of [place] - variety of support groups  [place] Call for more information  Narcotics Anonymous (NA), [facility] [address], [employer] [place]  2 meetings at this location   Special educational needs teacher         Address  Phone  Notes  [facility] [place],    [place]  [phone]   [facility]  [address] [place] [id number], [place], [place]   [place], [place] [phone] Admissions: 8am-3pm M-F  Incentives Substance [place] [address]    [place], [place] [phone]   [facility] [address] [place], [place], [place]   [facility] [address],  [place], [place]   [facility]ight Programs - Intensive Outpatient [address] Kristeen [address], [place], [place]   [facility] ([place].,  [place], [place] [phone] or [phone]   [facility]idential Treatment Services (RTS) [address], [place], [place] Accepts Medicaid  [facility] [place] [address],  [facility] [place] [phone] Substance Abuse/Addiction Treatment   [facility] Organization         Address  Phone  Notes  [facility]  [phone]   Domenic Schwab, PhD [address] Arlis Porta [place], [place]   [phone] or [phone]   [facility] [place], [place] [phone]   [facility] [address] [place], [place] [phone] Insurance/Medicaid/sponsorship through [facility] and Families [address] [place] [place]                                    [place], [place] [phone] [place] [address], [place] [phone]    Dr. Adele Schilder  [phone]   [facility] Dept. 1) [address] [place] 2) [place] 3)  [place] [address], [place] [phone]  [phone]   [place] [phone] or [phone] (After Hours)

## 2013-11-16 NOTE — ED Notes (Signed)
Patient was seen here yesterday for the same pain, he had his prescriptions filled.  He is reporting that they do not relieve his pain.  His wife reports he was seen here 5 years ago and we gave him a shot that made him feel better.

## 2013-11-18 NOTE — ED Provider Notes (Signed)
Medical screening examination/treatment/procedure(s) were performed by non-physician practitioner and as supervising physician I was immediately available for consultation/collaboration.   EKG Interpretation None        Blanchie Dessert, MD 11/18/13 1605

## 2013-11-18 NOTE — ED Provider Notes (Signed)
CSN: [id number]     Arrival date & time 11/16/13  2009 History   First MD Initiated Contact with Patient 11/16/13 2103     Chief Complaint  Patient presents with  . Back Pain     (Consider location/radiation/quality/duration/timing/severity/associated sxs/prior Treatment) HPI Comments: Wayne Grimes is a 28 y.o. male with a past medical history of leukemia presenting the Emergency Department with a chief complaint of Right gluteal pain for several days.  He reports radiation to right lower extremity. The patient reports increase in discomfort with walking and movement.  He reports being elevated in the ED but didn't get relief of symptoms with naproxen. Last taken yesterday.  He denies sadle anathesia, urinary retention, bowel incontinence.  He reports similar symptoms several years ago and he "got a shot" for the discomfort. NO PCP   Patient is a 28 y.o. male presenting with back pain. The history is provided by the patient. A language interpreter was used.  Back Pain Location:  Gluteal region Associated symptoms: no fever, no numbness and no weakness     History reviewed. No pertinent past medical history. History reviewed. No pertinent past surgical history. No family history on file. History  Substance Use Topics  . Smoking status: Former Smoker -- 0.10 packs/day for 2 years    Types: Cigarettes    Quit date: 06/03/2011  . Smokeless tobacco: Never Used  . Alcohol Use: 1.8 oz/week    3 Cans of beer per week    Review of Systems  Constitutional: Negative for fever and chills.  Musculoskeletal: Positive for back pain, gait problem and myalgias. Negative for neck pain and neck stiffness.  Neurological: Negative for weakness and numbness.  All other systems reviewed and are negative.     Allergies  Review of patient's allergies indicates no known allergies.  Home Medications   Prior to Admission medications   Medication Sig Start Date End Date Taking? Authorizing  Provider  cyclobenzaprine (FLEXERIL) 10 MG tablet Take 1 tablet (10 mg total) by mouth 2 (two) times daily as needed for muscle spasms. 11/15/13  Yes Kaitlyn Szekalski, PA-C  HYDROcodone-acetaminophen (NORCO/VICODIN) 5-325 MG per tablet Take 1 tablet by mouth every 4 (four) hours as needed. 11/16/13   Aris Moman Burnetta Sabin, PA-C  ibuprofen (ADVIL,MOTRIN) 800 MG tablet Take 1 tablet (800 mg total) by mouth 3 (three) times daily. Take with food, after you have completed the prednisone. 11/16/13   Ellina Sivertsen Burnetta Sabin, PA-C  predniSONE (DELTASONE) 20 MG tablet Take 2 tablets (40 mg total) by mouth daily. Take 40 mg by mouth daily for 3 days, then 20mg  by mouth daily for 3 days, then 10mg  daily for 3 days 11/16/13   Lorrine Kin, PA-C   BP 150/93  Pulse 78  Temp(Src) 98.2 F (36.8 C) (Oral)  Resp 20  SpO2 100% Physical Exam  Nursing note and vitals reviewed. Constitutional: He is oriented to person, place, and time. He appears well-developed and well-nourished.  Musculoskeletal:       Legs: Palpation of the right glut recreates discomfort. Normal gait. Good and equal strength and sensation to bilateral extremities.   Neurological: He is alert and oriented to person, place, and time.  Skin: Skin is warm and dry.  Psychiatric: He has a normal mood and affect. His behavior is normal.    ED Course  Procedures (including critical care time) Labs Review Labs Reviewed - No data to display  Imaging Review No results found.   EKG Interpretation None  MDM   Final diagnoses:  Sciatic pain   Patient with back pain.  No neurological deficits and normal neuro exam.  Patient can walk but states is painful.  No loss of bowel or bladder control.  No concern for cauda equina.  RICE protocol and pain medicine indicated and discussed with patient.   Meds given in ED:  Medications  dexamethasone (DECADRON) injection 10 mg (10 mg Intramuscular Given 11/16/13 2128)    Discharge Medication List as of  11/16/2013  9:31 PM    START taking these medications   Details  HYDROcodone-acetaminophen (NORCO/VICODIN) 5-325 MG per tablet Take 1 tablet by mouth every 4 (four) hours as needed., Starting 11/16/2013, Until Discontinued, Print    ibuprofen (ADVIL,MOTRIN) 800 MG tablet Take 1 tablet (800 mg total) by mouth 3 (three) times daily. Take with food, after you have completed the prednisone., Starting 11/16/2013, Until Discontinued, Print    predniSONE (DELTASONE) 20 MG tablet Take 2 tablets (40 mg total) by mouth daily. Take 40 mg by mouth daily for 3 days, then 20mg  by mouth daily for 3 days, then 10mg  daily for 3 days, Starting 11/16/2013, Until Discontinued, Print            Lorrine Kin, PA-C 11/18/13 1511

## 2017-05-27 ENCOUNTER — Emergency Department (HOSPITAL_COMMUNITY): Payer: Worker's Compensation

## 2017-05-27 ENCOUNTER — Inpatient Hospital Stay (HOSPITAL_COMMUNITY)
Admission: EM | Admit: 2017-05-27 | Discharge: 2017-06-11 | DRG: 492 | Disposition: A | Discharge status: Other Acute Inpatient Hospital | Payer: Worker's Compensation | Attending: Student | Admitting: Student

## 2017-05-27 ENCOUNTER — Inpatient Hospital Stay (HOSPITAL_COMMUNITY): Payer: Worker's Compensation

## 2017-05-27 ENCOUNTER — Encounter (HOSPITAL_COMMUNITY): Payer: Self-pay

## 2017-05-27 DIAGNOSIS — J189 Pneumonia, unspecified organism: Secondary | ICD-10-CM | POA: Diagnosis present

## 2017-05-27 DIAGNOSIS — L89519 Pressure ulcer of right ankle, unspecified stage: Secondary | ICD-10-CM | POA: Diagnosis not present

## 2017-05-27 DIAGNOSIS — I509 Heart failure, unspecified: Secondary | ICD-10-CM | POA: Diagnosis not present

## 2017-05-27 DIAGNOSIS — E8889 Other specified metabolic disorders: Secondary | ICD-10-CM | POA: Diagnosis present

## 2017-05-27 DIAGNOSIS — W19XXXA Unspecified fall, initial encounter: Secondary | ICD-10-CM

## 2017-05-27 DIAGNOSIS — Z9221 Personal history of antineoplastic chemotherapy: Secondary | ICD-10-CM

## 2017-05-27 DIAGNOSIS — Z86718 Personal history of other venous thrombosis and embolism: Secondary | ICD-10-CM

## 2017-05-27 DIAGNOSIS — S82251A Displaced comminuted fracture of shaft of right tibia, initial encounter for closed fracture: Secondary | ICD-10-CM | POA: Diagnosis present

## 2017-05-27 DIAGNOSIS — R000 Tachycardia, unspecified: Secondary | ICD-10-CM | POA: Diagnosis present

## 2017-05-27 DIAGNOSIS — T07XXXA Unspecified multiple injuries, initial encounter: Secondary | ICD-10-CM | POA: Diagnosis present

## 2017-05-27 DIAGNOSIS — Y990 Civilian activity done for income or pay: Secondary | ICD-10-CM | POA: Diagnosis not present

## 2017-05-27 DIAGNOSIS — S82142A Displaced bicondylar fracture of left tibia, initial encounter for closed fracture: Secondary | ICD-10-CM | POA: Diagnosis present

## 2017-05-27 DIAGNOSIS — S92061A Displaced intraarticular fracture of right calcaneus, initial encounter for closed fracture: Secondary | ICD-10-CM | POA: Diagnosis present

## 2017-05-27 DIAGNOSIS — S82831A Other fracture of upper and lower end of right fibula, initial encounter for closed fracture: Secondary | ICD-10-CM | POA: Diagnosis present

## 2017-05-27 DIAGNOSIS — W11XXXA Fall on and from ladder, initial encounter: Secondary | ICD-10-CM

## 2017-05-27 DIAGNOSIS — R946 Abnormal results of thyroid function studies: Secondary | ICD-10-CM | POA: Diagnosis present

## 2017-05-27 DIAGNOSIS — Z79899 Other long term (current) drug therapy: Secondary | ICD-10-CM | POA: Diagnosis not present

## 2017-05-27 DIAGNOSIS — R0902 Hypoxemia: Secondary | ICD-10-CM | POA: Diagnosis not present

## 2017-05-27 DIAGNOSIS — D62 Acute posthemorrhagic anemia: Secondary | ICD-10-CM | POA: Diagnosis not present

## 2017-05-27 DIAGNOSIS — M25462 Effusion, left knee: Secondary | ICD-10-CM | POA: Diagnosis present

## 2017-05-27 DIAGNOSIS — Z419 Encounter for procedure for purposes other than remedying health state, unspecified: Secondary | ICD-10-CM

## 2017-05-27 DIAGNOSIS — Z87891 Personal history of nicotine dependence: Secondary | ICD-10-CM | POA: Diagnosis not present

## 2017-05-27 DIAGNOSIS — S92011A Displaced fracture of body of right calcaneus, initial encounter for closed fracture: Secondary | ICD-10-CM | POA: Diagnosis present

## 2017-05-27 DIAGNOSIS — T148XXA Other injury of unspecified body region, initial encounter: Secondary | ICD-10-CM

## 2017-05-27 DIAGNOSIS — E559 Vitamin D deficiency, unspecified: Secondary | ICD-10-CM | POA: Diagnosis not present

## 2017-05-27 DIAGNOSIS — S82122A Displaced fracture of lateral condyle of left tibia, initial encounter for closed fracture: Secondary | ICD-10-CM | POA: Diagnosis present

## 2017-05-27 DIAGNOSIS — E079 Disorder of thyroid, unspecified: Secondary | ICD-10-CM | POA: Diagnosis present

## 2017-05-27 DIAGNOSIS — Z881 Allergy status to other antibiotic agents status: Secondary | ICD-10-CM | POA: Diagnosis not present

## 2017-05-27 HISTORY — DX: Malignant (primary) neoplasm, unspecified: C80.1

## 2017-05-27 HISTORY — DX: Disorder of thyroid, unspecified: E07.9

## 2017-05-27 HISTORY — DX: Vitamin D deficiency, unspecified: E55.9

## 2017-05-27 LAB — CREATININE, SERUM: CREATININE: 0.69 mg/dL (ref 0.61–1.24)

## 2017-05-27 LAB — CBC WITH DIFFERENTIAL/PLATELET
Basophils Absolute: 0 10*3/uL (ref 0.0–0.1)
Basophils Relative: 0 %
Eosinophils Absolute: 0 10*3/uL (ref 0.0–0.7)
Eosinophils Relative: 1 %
HCT: 33.2 % — ABNORMAL LOW (ref 39.0–52.0)
Hemoglobin: 10.9 g/dL — ABNORMAL LOW (ref 13.0–17.0)
Lymphocytes Relative: 18 %
Lymphs Abs: 1 10*3/uL (ref 0.7–4.0)
MCH: 30.5 pg (ref 26.0–34.0)
MCHC: 32.8 g/dL (ref 30.0–36.0)
MCV: 93 fL (ref 78.0–100.0)
Monocytes Absolute: 0.5 10*3/uL (ref 0.1–1.0)
Monocytes Relative: 9 %
Neutro Abs: 4.1 10*3/uL (ref 1.7–7.7)
Neutrophils Relative %: 72 %
Platelets: 185 10*3/uL (ref 150–400)
RBC: 3.57 MIL/uL — ABNORMAL LOW (ref 4.22–5.81)
RDW: 14.7 % (ref 11.5–15.5)
WBC: 5.5 10*3/uL (ref 4.0–10.5)

## 2017-05-27 LAB — BASIC METABOLIC PANEL
Anion gap: 8 (ref 5–15)
BUN: 12 mg/dL (ref 6–20)
CO2: 24 mmol/L (ref 22–32)
Calcium: 8.2 mg/dL — ABNORMAL LOW (ref 8.9–10.3)
Chloride: 108 mmol/L (ref 101–111)
Creatinine, Ser: 0.75 mg/dL (ref 0.61–1.24)
GFR calc Af Amer: >60 mL/min (ref >60)
GFR calc non Af Amer: >60 mL/min (ref >60)
Glucose, Bld: 161 mg/dL — ABNORMAL HIGH (ref 65–99)
Potassium: 3.3 mmol/L — ABNORMAL LOW (ref 3.5–5.1)
Sodium: 140 mmol/L (ref 135–145)

## 2017-05-27 LAB — CBC
HCT: 32.7 % — ABNORMAL LOW (ref 39.0–52.0)
Hemoglobin: 10.6 g/dL — ABNORMAL LOW (ref 13.0–17.0)
MCH: 30 pg (ref 26.0–34.0)
MCHC: 32.4 g/dL (ref 30.0–36.0)
MCV: 92.6 fL (ref 78.0–100.0)
Platelets: 180 10*3/uL (ref 150–400)
RBC: 3.53 MIL/uL — ABNORMAL LOW (ref 4.22–5.81)
RDW: 14.8 % (ref 11.5–15.5)
WBC: 5.5 10*3/uL (ref 4.0–10.5)

## 2017-05-27 MED ORDER — ACETAMINOPHEN 650 MG RE SUPP: 650.0000 mg | Freq: Four times a day (QID) | RECTAL | Status: DC | PRN

## 2017-05-27 MED ORDER — LORAZEPAM 0.5 MG PO TABS
0.5000 mg | ORAL_TABLET | Freq: Once | ORAL | Status: AC
  Administered 2017-05-27: 0.5 mg via ORAL
  Filled 2017-05-27: qty 1

## 2017-05-27 MED ORDER — OXYCODONE HCL 5 MG PO TABS
5.0000 mg | ORAL_TABLET | ORAL | Status: DC | PRN
  Administered 2017-05-27 – 2017-05-31 (×13): 10 mg via ORAL
  Filled 2017-05-27 (×14): qty 2

## 2017-05-27 MED ORDER — ONDANSETRON HCL 4 MG PO TABS: 4.0000 mg | ORAL_TABLET | Freq: Four times a day (QID) | ORAL | Status: DC | PRN

## 2017-05-27 MED ORDER — HYDROMORPHONE HCL 1 MG/ML IJ SOLN
1.0000 mg | INTRAMUSCULAR | Status: DC | PRN
  Administered 2017-05-28 – 2017-06-02 (×16): 1 mg via INTRAVENOUS
  Filled 2017-05-27 (×16): qty 1

## 2017-05-27 MED ORDER — HYDROMORPHONE HCL 1 MG/ML IJ SOLN
1.0000 mg | Freq: Once | INTRAMUSCULAR | Status: AC
  Administered 2017-05-27: 1 mg via INTRAMUSCULAR
  Filled 2017-05-27: qty 1

## 2017-05-27 MED ORDER — OXYCODONE-ACETAMINOPHEN 5-325 MG PO TABS
2.0000 | ORAL_TABLET | Freq: Once | ORAL | Status: AC
  Administered 2017-05-27: 2 via ORAL
  Filled 2017-05-27: qty 2

## 2017-05-27 MED ORDER — ENOXAPARIN SODIUM 40 MG/0.4ML ~~LOC~~ SOLN
40.0000 mg | SUBCUTANEOUS | Status: DC
  Administered 2017-05-30 – 2017-06-11 (×13): 40 mg via SUBCUTANEOUS
  Filled 2017-05-27 (×13): qty 0.4

## 2017-05-27 MED ORDER — SODIUM CHLORIDE 0.9 % IV SOLN
INTRAVENOUS | Status: DC
  Administered 2017-05-27 – 2017-05-28 (×2): via INTRAVENOUS

## 2017-05-27 MED ORDER — MORPHINE SULFATE (PF) 4 MG/ML IV SOLN: 2.0000 mg | INTRAVENOUS | Status: DC | PRN

## 2017-05-27 MED ORDER — HYDROMORPHONE HCL 1 MG/ML IJ SOLN
1.0000 mg | Freq: Once | INTRAMUSCULAR | Status: AC
  Administered 2017-05-27: 1 mg via INTRAVENOUS
  Filled 2017-05-27: qty 1

## 2017-05-27 MED ORDER — DASATINIB 70 MG PO TABS
70.0000 mg | ORAL_TABLET | Freq: Every day | ORAL | Status: DC
  Administered 2017-05-28 – 2017-06-11 (×14): 70 mg via ORAL
  Filled 2017-05-27 (×15): qty 1

## 2017-05-27 MED ORDER — METHOCARBAMOL 500 MG PO TABS
500.0000 mg | ORAL_TABLET | Freq: Four times a day (QID) | ORAL | Status: DC | PRN
  Administered 2017-05-27: 500 mg via ORAL
  Filled 2017-05-27: qty 1

## 2017-05-27 MED ORDER — ONDANSETRON HCL 4 MG/2ML IJ SOLN: 4.0000 mg | Freq: Four times a day (QID) | INTRAMUSCULAR | Status: DC | PRN

## 2017-05-27 MED ORDER — DEXTROSE 5 % IV SOLN
500.0000 mg | Freq: Four times a day (QID) | INTRAVENOUS | Status: DC | PRN
  Filled 2017-05-27: qty 5

## 2017-05-27 MED ORDER — ACETAMINOPHEN 325 MG PO TABS
650.0000 mg | ORAL_TABLET | Freq: Four times a day (QID) | ORAL | Status: DC | PRN
  Administered 2017-05-29 – 2017-06-06 (×6): 650 mg via ORAL
  Filled 2017-05-27 (×6): qty 2

## 2017-05-27 MED ORDER — IBUPROFEN 400 MG PO TABS
600.0000 mg | ORAL_TABLET | Freq: Once | ORAL | Status: AC
  Administered 2017-05-27: 600 mg via ORAL
  Filled 2017-05-27: qty 2

## 2017-05-27 MED ORDER — LEVOFLOXACIN 500 MG PO TABS: 750.0000 mg | ORAL_TABLET | Freq: Every day | ORAL | Status: DC

## 2017-05-27 NOTE — ED Triage Notes (Signed)
Pt was on the roof of an awning over the sidewalk and it broke and pt slid off approx 12 feet.  C/O pain to r leg.  Rescue squad immobilized r leg.  Reports tender to r lower leg.

## 2017-05-27 NOTE — Progress Notes (Addendum)
Ortho Note  I have discussed this case with Dr Wilson Singer of the ED at [facility].  On his assessment the patient's injuries are orthopedic only and isolated to fractures.  There is no concern of concomitant lumbar spine pathology.  He has been evaluated by the orthopedist on call in [place] and determined that a transfer to [facility] would be in his best interest.  I agree with that transfer And will accept him onto my service tonight.  Will be made nothing by mouth at midnight in anticipation of operative fixation of the right tibia shaft fracture tomorrow with Dr. Helane Gunther, 1 of our orthopedic traumatologist.  I will personally see the patient soon as he is made the transfer down to come to hospital.  The examination per report in [place] is benign for any open fractures or developing compartment syndrome.  He will be placed in a short leg splint on the right leg and a knee immobilizer on the left knee.  Nonweightbearing to bilateral lower extremities for now.

## 2017-05-27 NOTE — ED Provider Notes (Signed)
[facility] EMERGENCY DEPARTMENT Provider Note   CSN: [id number] Arrival date & time: 05/27/17  1508     History   Chief Complaint Chief Complaint  Patient presents with  . Fall    HPI Wayne Grimes is a 31 y.o. male.  HPI   31 year old male with right lower extremity pain. Patient works as a Theme park manager.  Prior to arrival, he lost his balance and fell approximately one story.  He thinks he struck first with his right leg.  He is having severe pain from about mid right shin down into his foot and ankle.  Denies any significant pain anywhere else.  He does not think he struck his head.  There is no loss of consciousness.  No numbness or tingling.  He did not try bearing weight because of the severe pain.  No blood thinners. Spanish interpretor used.   Past Medical History:  Diagnosis Date  . Cancer [facility])    leukemia    Patient Active Problem List   Diagnosis Date Noted  . Leukemia, acute ([place]) 07/21/2012  . Generalized weakness 06/02/2012  . Anemia 06/02/2012  . Leukocytosis 06/02/2012  . Thrombocytopenia ([place]) 06/02/2012  . Tinea cruris 06/02/2012    History reviewed. No pertinent surgical history.     Home Medications    Prior to Admission medications   Medication Sig Start Date End Date Taking? Authorizing Provider  dasatinib (SPRYCEL) 70 MG tablet Take 70 mg by mouth at bedtime.  05/19/17  Yes [provider]  levofloxacin (LEVAQUIN) 750 MG tablet Take 750 mg by mouth daily. 14 day course starting on 05/19/2017 05/19/17 06/02/17 Yes [provider]    Family History No family history on file.  Social History Social History  Substance Use Topics  . Smoking status: Former Smoker    Packs/day: 0.10    Years: 2.00    Types: Cigarettes    Quit date: 06/03/2011  . Smokeless tobacco: Never Used  . Alcohol use 1.8 oz/week    3 Cans of beer per week     Allergies   Vancomycin   Review of Systems Review of Systems  All systems  reviewed and negative, other than as noted in HPI.  Physical Exam Updated Vital Signs BP 128/87 (BP Location: Left Arm)   Pulse (!) 111   Temp 98.4 F (36.9 C) (Oral)   Resp 18   Ht 5\' 6"  (1.676 m)   Wt 101.6 kg (224 lb)   SpO2 95%   BMI 36.15 kg/m   Physical Exam  Constitutional: He appears well-developed and well-nourished. No distress.  HENT:  Head: Normocephalic and atraumatic.  Eyes: Conjunctivae are normal. Right eye exhibits no discharge. Left eye exhibits no discharge.  Neck: Neck supple.  Cardiovascular: Regular rhythm and normal heart sounds.  Exam reveals no gallop and no friction rub.   No murmur heard. Mild tachcyardia  Pulmonary/Chest: Effort normal and breath sounds normal. No respiratory distress.  Abdominal: Soft. He exhibits no distension. There is no tenderness.  Musculoskeletal: He exhibits no edema or tenderness.  Swelling from mid-distal shin down to the foot/ankle.  Severe tenderness of the right ankle and proximal foot.  Palpable DP pulse.  Foot is warm to the touch.  Is able to wiggle his toes.  He cannot range his ankle actively secondary to severe pain.  Right knee feels stable, some tenderness laterally.  No significant effusion.  L knee with effusion. Can actively range although with increased pain. TTP anteriorly. No pain  with range of motion of either hip. No midline spinal tenderness. Small abrasion to R hand w/o any bony tenderness or pain with ROM.   Neurological: He is alert.  Skin: Skin is warm and dry.  Psychiatric: He has a normal mood and affect. His behavior is normal. Thought content normal.  Nursing note and vitals reviewed.    ED Treatments / Results  Labs (all labs ordered are listed, but only abnormal results are displayed) Labs Reviewed  CBC WITH DIFFERENTIAL/PLATELET  BASIC METABOLIC PANEL    EKG  EKG Interpretation None       Radiology Dg Knee 2 Views Right  Result Date: 05/27/2017 CLINICAL DATA:  Initial evaluation  for acute trauma, fall. EXAM: RIGHT KNEE - 1-2 VIEW COMPARISON:  None. FINDINGS: There is an acute comminuted fracture involving the proximal right fibula with mild posterior and medial displacement. Proximal tibia and distal femur intact. No joint effusion. No other soft tissue abnormality. IMPRESSION: Acute comminuted mildly displaced fracture of the proximal right fibula. Electronically Signed   By: Jeannine Boga M.D.   On: 05/27/2017 16:30   Dg Tibia/fibula Right  Result Date: 05/27/2017 CLINICAL DATA:  Initial evaluation for acute trauma, fall. EXAM: RIGHT TIBIA AND FIBULA - 2 VIEW COMPARISON:  None. FINDINGS: There is an acute oblique comminuted fracture through the mid- distal right tibial shaft with slight lateral displacement. Additional comminuted minimally displaced fracture of the proximal right fibular shaft. No soft tissue abnormality. IMPRESSION: 1. Acute oblique comminuted fracture of the mid-distal right tibial shaft with slight lateral displacement. 2. Acute comminuted minimally displaced proximal right fibular shaft fracture. Electronically Signed   By: Jeannine Boga M.D.   On: 05/27/2017 16:32   Dg Ankle 2 Views Right  Result Date: 05/27/2017 CLINICAL DATA:  Initial evaluation for acute trauma, fall. EXAM: RIGHT ANKLE - 2 VIEW COMPARISON:  None. FINDINGS: Acute comminuted oblique fracture of the mid-distal right tibial shaft with slight lateral displacement. Additional acute comminuted fracture of the right calcaneus with intra-articular extension into the subtalar joint no other acute injury. Mild soft tissue swelling about the ankle. IMPRESSION: 1. Acute oblique comminuted fracture of the mid-distal right tibial shaft with slight lateral displacement. 2. Acute comminuted right calcaneal fracture with intra-articular extension. Electronically Signed   By: Jeannine Boga M.D.   On: 05/27/2017 16:33   Ct Knee Left Wo Contrast  Result Date: 05/27/2017 CLINICAL  DATA:  [place] off roof. Tibial plateau fracture on plain films. EXAM: CT OF THE LEFT KNEE WITHOUT CONTRAST TECHNIQUE: Multidetector CT imaging of the LEFT knee was performed according to the standard protocol. Multiplanar CT image reconstructions were also generated. COMPARISON:  Plain films earlier today FINDINGS: There is a lateral tibial plateau fracture noted. Slight depression of the lateral tibial plateau. Large associated joint effusion/ lipohemarthrosis. No additional fracture noted. No subluxation or dislocation. IMPRESSION: Slightly depressed lateral tibial plateau fracture with associated large lipohemarthrosis. Electronically Signed   By: Rolm Baptise M.D.   On: 05/27/2017 20:07   Ct Foot Right Wo Contrast  Result Date: 05/27/2017 CLINICAL DATA:  Right foot pain after falling approximately 12 feet. Comminuted calcaneal fracture with intra-articular extension on foot radiographs earlier today. The patient also has a tibial shaft fracture. EXAM: CT OF THE RIGHT FOOT WITHOUT CONTRAST TECHNIQUE: Multidetector CT imaging of the right foot was performed according to the standard protocol. Multiplanar CT image reconstructions were also generated. COMPARISON:  Right foot radiographs obtained earlier today. FINDINGS: Bones/Joint/Cartilage Markedly comminuted calcaneus fracture  involving the posterior and middle subtalar joints. No other fractures are seen. Dorsal talonavicular and navicular middle cuneiform spur formation. Ligaments Suboptimally assessed by CT. Muscles and Tendons Intact. Soft tissues Diffuse subcutaneous edema. IMPRESSION: 1. Markedly comminuted calcaneus fracture involving the posterior and middle subtalar joints. 2. Dorsal tarsal degenerative spur formation. Electronically Signed   By: Claudie Revering M.D.   On: 05/27/2017 19:16   Dg Knee Complete 4 Views Left  Result Date: 05/27/2017 CLINICAL DATA:  Fall with knee pain EXAM: LEFT KNEE - COMPLETE 4+ VIEW COMPARISON:  None. FINDINGS: Large  suprapatellar effusion with fat fluid level. Mildly depressed lateral tibial plateau fracture. No dislocation. IMPRESSION: Large knee effusion with mildly depressed lateral tibial plateau fracture Electronically Signed   By: Donavan Foil M.D.   On: 05/27/2017 18:12   Dg Foot 2 Views Right  Result Date: 05/27/2017 CLINICAL DATA:  Initial evaluation for acute trauma, fall. EXAM: RIGHT FOOT - 2 VIEW COMPARISON:  None. FINDINGS: Acute comminuted fracture of the right calcaneus with intra-articular extension into the subtalar joint. No other acute fracture or dislocation about the foot. IMPRESSION: 1. Acute comminuted right calcaneal fracture with intra-articular extension. 2. No other acute traumatic injury about the foot. Electronically Signed   By: Jeannine Boga M.D.   On: 05/27/2017 16:35    Procedures Procedures (including critical care time)  Medications Ordered in ED Medications  0.9 %  sodium chloride infusion ( Intravenous New Bag/Given 05/27/17 1853)  HYDROmorphone (DILAUDID) injection 1 mg (not administered)  oxyCODONE-acetaminophen (PERCOCET/ROXICET) 5-325 MG per tablet 2 tablet (2 tablets Oral Given 05/27/17 1607)  ibuprofen (ADVIL,MOTRIN) tablet 600 mg (600 mg Oral Given 05/27/17 1607)  LORazepam (ATIVAN) tablet 0.5 mg (0.5 mg Oral Given 05/27/17 1607)  HYDROmorphone (DILAUDID) injection 1 mg (1 mg Intramuscular Given 05/27/17 1715)     Initial Impression / Assessment and Plan / ED Course  I have reviewed the triage vital signs and the nursing notes.  Pertinent labs & imaging results that were available during my care of the patient were reviewed by me and considered in my medical decision making (see chart for details).     RLE after fall ~1 story. Clinically has distal tib/fib or ankle fx. Closed injury. NVI. Pain meds. Imaging. Denies pain elsewhere. No midline spinal tenderness. Abdominal exam benign.   7:17 PM Discussed with Dr Aline Brochure, orthopedic surgeon on  call for [facility]. He feels he needs higher level of orthopedic care than can be provided here. Discussed with Dr Stann Mainland, orthopedic surgery, at [facility]. We appreciate his assistance. Will transfer.  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 Records reviewed through [place]. He was seen in the office last on 10/16. Actually diagnosed with pneumonia at that time and started on levaquin. He has been feeling much better and working. It doesn't appear that he is scheduled to start fifth cycle of Blincyto until next week. Discussed again with Dr Stann Mainland. At this point he is comfortable with patient coming to [facility]. I will try to touch base with heme/onc at [facility] but, at this time, there doesn't appear to be an acute need to transfer him to [facility].  Discussed with on-call heme/onc provider. Recent counts have been ok. With the current situation, she doesn't feel he requires transfer to their facility. She will relay message to his oncologist, Dr Florene Glen.    Final Clinical Impressions(s) / ED Diagnoses   Final diagnoses:  Multiple fractures  New Prescriptions New Prescriptions   No medications on file     Virgel Manifold, MD 05/27/17 2021

## 2017-05-27 NOTE — ED Notes (Signed)
ED Provider at bedside. 

## 2017-05-27 NOTE — Progress Notes (Signed)
Patient transferred from [facility] by  EMS. On arrival patient is Alert and denies any numbness or tingling to bilateral lower extremities. Patient on bed resting at this time.

## 2017-05-27 NOTE — H&P (Signed)
ORTHOPAEDIC CONSULTATION  REQUESTING PHYSICIAN: Nicholes Stairs, MD  PCP:  Patient, No Pcp Per  Chief Complaint: Fall from approximately 12 feet off of a ladder.  HPI: Wayne Grimes is a 31 y.o. male who complains of bilateral lower extremity pain following a fall on 12 feet.  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  He currently receives every other week chemotherapy treatments. He is able to continue working between treatments. He also currently has community-acquired pneumonia and is being treated with Levaquin which she started a few days prior to this fall. He was initially evaluated at the emergency department at [facility]. There is injuries were assessed and it was felt that he needed the attention of an orthopedic trauma specialist. We recommended transfer to [facility] under my care and then transitioned in the morning to our orthopedic trauma service.   Currently he denies any numbness or tingling in either leg. He is complaining of 10 out of 10 pain in the right lower extremity around the ankle. He also has left knee pain.  Past Medical History:  Diagnosis Date  . Cancer [facility])    leukemia   History reviewed. No pertinent surgical history. Social History   Social History  . Marital status: Single    Spouse name: N/A  . Number of children: N/A  . Years of education: N/A   Social History Main Topics  . Smoking status: Former Smoker    Packs/day: 0.10    Years: 2.00    Types: Cigarettes    Quit date: 06/03/2011  . Smokeless tobacco: Never Used  . Alcohol use 1.8 oz/week    3 Cans of beer per week  . Drug use: No  . Sexual activity: Yes   Other Topics Concern  . None   Social History Narrative  . None   No family history on file. Allergies  Allergen Reactions  . Vancomycin Other (See Comments)    Reaction 12/24/15 and drug fevers noted 01/15/16   Prior to Admission medications   Medication Sig Start Date End  Date Taking? Authorizing Provider  dasatinib (SPRYCEL) 70 MG tablet Take 70 mg by mouth at bedtime.  05/19/17  Yes [provider]  levofloxacin (LEVAQUIN) 750 MG tablet Take 750 mg by mouth daily. 14 day course starting on 05/19/2017 05/19/17 06/02/17 Yes [provider]   Dg Knee 2 Views Right  Result Date: 05/27/2017 CLINICAL DATA:  Initial evaluation for acute trauma, fall. EXAM: RIGHT KNEE - 1-2 VIEW COMPARISON:  None. FINDINGS: There is an acute comminuted fracture involving the proximal right fibula with mild posterior and medial displacement. Proximal tibia and distal femur intact. No joint effusion. No other soft tissue abnormality. IMPRESSION: Acute comminuted mildly displaced fracture of the proximal right fibula. Electronically Signed   By: Jeannine Boga M.D.   On: 05/27/2017 16:30   Dg Tibia/fibula Right  Result Date: 05/27/2017 CLINICAL DATA:  Initial evaluation for acute trauma, fall. EXAM: RIGHT TIBIA AND FIBULA - 2 VIEW COMPARISON:  None. FINDINGS: There is an acute oblique comminuted fracture through the mid- distal right tibial shaft with slight lateral displacement. Additional comminuted minimally displaced fracture of the proximal right fibular shaft. No soft tissue abnormality. IMPRESSION: 1. Acute oblique comminuted fracture of the mid-distal right tibial shaft with slight lateral displacement. 2. Acute comminuted minimally displaced proximal right fibular shaft fracture. Electronically Signed   By: Jeannine Boga M.D.   On: 05/27/2017 16:32   Dg  Ankle 2 Views Right  Result Date: 05/27/2017 CLINICAL DATA:  Initial evaluation for acute trauma, fall. EXAM: RIGHT ANKLE - 2 VIEW COMPARISON:  None. FINDINGS: Acute comminuted oblique fracture of the mid-distal right tibial shaft with slight lateral displacement. Additional acute comminuted fracture of the right calcaneus with intra-articular extension into the subtalar joint no other acute injury. Mild  soft tissue swelling about the ankle. IMPRESSION: 1. Acute oblique comminuted fracture of the mid-distal right tibial shaft with slight lateral displacement. 2. Acute comminuted right calcaneal fracture with intra-articular extension. Electronically Signed   By: Jeannine Boga M.D.   On: 05/27/2017 16:33   Ct Knee Left Wo Contrast  Result Date: 05/27/2017 CLINICAL DATA:  [place] off roof. Tibial plateau fracture on plain films. EXAM: CT OF THE LEFT KNEE WITHOUT CONTRAST TECHNIQUE: Multidetector CT imaging of the LEFT knee was performed according to the standard protocol. Multiplanar CT image reconstructions were also generated. COMPARISON:  Plain films earlier today FINDINGS: There is a lateral tibial plateau fracture noted. Slight depression of the lateral tibial plateau. Large associated joint effusion/ lipohemarthrosis. No additional fracture noted. No subluxation or dislocation. IMPRESSION: Slightly depressed lateral tibial plateau fracture with associated large lipohemarthrosis. Electronically Signed   By: Rolm Baptise M.D.   On: 05/27/2017 20:07   Ct Foot Right Wo Contrast  Result Date: 05/27/2017 CLINICAL DATA:  Right foot pain after falling approximately 12 feet. Comminuted calcaneal fracture with intra-articular extension on foot radiographs earlier today. The patient also has a tibial shaft fracture. EXAM: CT OF THE RIGHT FOOT WITHOUT CONTRAST TECHNIQUE: Multidetector CT imaging of the right foot was performed according to the standard protocol. Multiplanar CT image reconstructions were also generated. COMPARISON:  Right foot radiographs obtained earlier today. FINDINGS: Bones/Joint/Cartilage Markedly comminuted calcaneus fracture involving the posterior and middle subtalar joints. No other fractures are seen. Dorsal talonavicular and navicular middle cuneiform spur formation. Ligaments Suboptimally assessed by CT. Muscles and Tendons Intact. Soft tissues Diffuse subcutaneous edema.  IMPRESSION: 1. Markedly comminuted calcaneus fracture involving the posterior and middle subtalar joints. 2. Dorsal tarsal degenerative spur formation. Electronically Signed   By: Claudie Revering M.D.   On: 05/27/2017 19:16   Dg Knee Complete 4 Views Left  Result Date: 05/27/2017 CLINICAL DATA:  Fall with knee pain EXAM: LEFT KNEE - COMPLETE 4+ VIEW COMPARISON:  None. FINDINGS: Large suprapatellar effusion with fat fluid level. Mildly depressed lateral tibial plateau fracture. No dislocation. IMPRESSION: Large knee effusion with mildly depressed lateral tibial plateau fracture Electronically Signed   By: Donavan Foil M.D.   On: 05/27/2017 18:12   Dg Foot 2 Views Right  Result Date: 05/27/2017 CLINICAL DATA:  Initial evaluation for acute trauma, fall. EXAM: RIGHT FOOT - 2 VIEW COMPARISON:  None. FINDINGS: Acute comminuted fracture of the right calcaneus with intra-articular extension into the subtalar joint. No other acute fracture or dislocation about the foot. IMPRESSION: 1. Acute comminuted right calcaneal fracture with intra-articular extension. 2. No other acute traumatic injury about the foot. Electronically Signed   By: Jeannine Boga M.D.   On: 05/27/2017 16:35    Positive ROS: All other systems have been reviewed and were otherwise negative with the exception of those mentioned in the HPI and as above.  Physical Exam: General: Alert, no acute distress Cardiovascular: No pedal edema Respiratory: No cyanosis, no use of accessory musculature GI: No organomegaly, abdomen is soft and non-tender Skin: No lesions in the area of chief complaint Neurologic: Sensation intact distally Psychiatric: Patient is  competent for consent with normal mood and affect Lymphatic: No axillary or cervical lymphadenopathy  MUSCULOSKELETAL:   Right leg exam   :  There is a posterior splint applied that does go proximal to the popliteal fossa. The Ace bandage was swept to the side and his calf was noted  to be soft and compressible. There are no open wounds at the site of the fracture. The foot and ankle has moderate swelling at this point in time just a 1+ palpable dorsalis pedis pulse. He does endorse sensation intact to light touch distally in the deep and superficial peroneal nerves, sural nerve, saphenous nerve, tibial nerve. He has active plantarflexion and dorsiflexion without pain. He has passive flexion-extension of the hallux without pain. He is exquisitely tender around the calcaneus and hindfoot.    Left leg exam:  Knee immobilizer in place. This was removed. He has a 3+ joint effusion with tenderness along the lateral joint line. Gentle range of motion does increase the pain and was deferred. At the calf is soft and compressible. No signs of compartment syndrome. Distally he endorses sensation intact to light touch throughout the deep and superficial peroneal nerves, sural nerve, saphenous nerve, and tibial nerve. Motor is intact plantar flexion and dorsiflexion. 2+ dorsalis pedis pulse.  Assessment: 1. Right closed tibia shaft fracture with proximal fibular fracture at the head and neck.  2. Right closed calcaneus fracture with comminution and depression.  3. Left lateral tibial plateau fracture with depression, closed.  4. Community acquired pneumonia.  5. Leukemia   Plan:  -  I have discussed this case early with the orthopedic trauma specialist. They will assume care in the morning. The patient posted at this time for fixation of the right tibia shaft fracture as well as the left tibial plateau fracture tomorrow. The right calcaneus fracture will likely need soft tissue rest and 10-14 days between presentation and fixation. He is to be nonweightbearing bilateral lower extremity at this time with nothing by mouth status to begin at midnight.  - We do have a stat CT ordered of the right knee to assess more closely for any concomitant pathology given the proximal fibula fracture.  -  We will continue his oral Levaquin as prescribed by his oncologist while in the hospital.  - Regarding his ALL the on call oncologist at [facility] has been contacted and they felt he is stable to proceed with orthopedic care while here in this hospital setting. He is tentatively scheduled to start his next chemotherapy cycle next week so if he does have an extended stay in the inpatient setting will have to coordinate that, versus potentially discharging him in bringing him back for subacute fixation of the calcaneus.  - We'll start IV fluids at midnight  - When necessary oxycodone and IV Dilaudid as needed for pain and breakthrough pain.  -Will initiate Lovenox postoperatively for DVT prophylaxis.    Dispositional planning will begin following operative fixation. He     Nicholes Stairs, MD Cell [phone]    05/27/2017 10:48 PM

## 2017-05-28 ENCOUNTER — Inpatient Hospital Stay (HOSPITAL_COMMUNITY): Payer: Worker's Compensation

## 2017-05-28 ENCOUNTER — Encounter (HOSPITAL_COMMUNITY): Payer: No Typology Code available for payment source

## 2017-05-28 ENCOUNTER — Encounter (HOSPITAL_COMMUNITY): Payer: Self-pay | Admitting: Certified Registered"

## 2017-05-28 DIAGNOSIS — Z86718 Personal history of other venous thrombosis and embolism: Secondary | ICD-10-CM

## 2017-05-28 DIAGNOSIS — Z8701 Personal history of pneumonia (recurrent): Secondary | ICD-10-CM | POA: Insufficient documentation

## 2017-05-28 DIAGNOSIS — S82122G Displaced fracture of lateral condyle of left tibia, subsequent encounter for closed fracture with delayed healing: Secondary | ICD-10-CM

## 2017-05-28 LAB — HIV ANTIBODY (ROUTINE TESTING W REFLEX): HIV SCREEN 4TH GENERATION: NONREACTIVE

## 2017-05-28 LAB — SURGICAL PCR SCREEN
MRSA, PCR: NEGATIVE
Staphylococcus aureus: NEGATIVE

## 2017-05-28 MED ORDER — ROCURONIUM BROMIDE 10 MG/ML (PF) SYRINGE
PREFILLED_SYRINGE | INTRAVENOUS | Status: AC
  Filled 2017-05-28: qty 5

## 2017-05-28 MED ORDER — ACETAMINOPHEN 650 MG RE SUPP: 650.0000 mg | Freq: Four times a day (QID) | RECTAL | Status: DC | PRN

## 2017-05-28 MED ORDER — POTASSIUM CHLORIDE IN NACL 20-0.9 MEQ/L-% IV SOLN
INTRAVENOUS | Status: DC
  Administered 2017-05-28 – 2017-06-01 (×4): via INTRAVENOUS
  Filled 2017-05-28 (×4): qty 1000

## 2017-05-28 MED ORDER — PIPERACILLIN-TAZOBACTAM 3.375 G IVPB 30 MIN
3.3750 g | Freq: Once | INTRAVENOUS | Status: AC
  Administered 2017-05-28: 3.375 g via INTRAVENOUS
  Filled 2017-05-28: qty 50

## 2017-05-28 MED ORDER — DOCUSATE SODIUM 100 MG PO CAPS
100.0000 mg | ORAL_CAPSULE | Freq: Two times a day (BID) | ORAL | Status: DC
  Administered 2017-05-28 – 2017-05-30 (×3): 100 mg via ORAL
  Filled 2017-05-28 (×3): qty 1

## 2017-05-28 MED ORDER — DEXTROSE 5 % IV SOLN
500.0000 mg | Freq: Four times a day (QID) | INTRAVENOUS | Status: DC
  Filled 2017-05-28 (×31): qty 5

## 2017-05-28 MED ORDER — OXYCODONE-ACETAMINOPHEN 5-325 MG PO TABS
1.0000 | ORAL_TABLET | Freq: Four times a day (QID) | ORAL | Status: DC | PRN
  Administered 2017-06-01 – 2017-06-02 (×4): 2 via ORAL
  Filled 2017-05-28 (×4): qty 2

## 2017-05-28 MED ORDER — ACETAMINOPHEN 325 MG PO TABS: 650.0000 mg | ORAL_TABLET | Freq: Four times a day (QID) | ORAL | Status: DC | PRN

## 2017-05-28 MED ORDER — PIPERACILLIN-TAZOBACTAM 3.375 G IVPB
3.3750 g | Freq: Three times a day (TID) | INTRAVENOUS | Status: DC
  Administered 2017-05-28 – 2017-06-01 (×11): 3.375 g via INTRAVENOUS
  Filled 2017-05-28 (×12): qty 50

## 2017-05-28 MED ORDER — LEVOFLOXACIN IN D5W 750 MG/150ML IV SOLN
750.0000 mg | INTRAVENOUS | Status: DC
  Administered 2017-05-28: 750 mg via INTRAVENOUS
  Filled 2017-05-28: qty 150

## 2017-05-28 MED ORDER — KETOROLAC TROMETHAMINE 15 MG/ML IJ SOLN
15.0000 mg | Freq: Three times a day (TID) | INTRAMUSCULAR | Status: AC
  Administered 2017-05-28 – 2017-05-29 (×5): 15 mg via INTRAVENOUS
  Filled 2017-05-28 (×5): qty 1

## 2017-05-28 MED ORDER — DEXAMETHASONE SODIUM PHOSPHATE 10 MG/ML IJ SOLN
INTRAMUSCULAR | Status: AC
  Filled 2017-05-28: qty 1

## 2017-05-28 MED ORDER — EPHEDRINE 5 MG/ML INJ
INTRAVENOUS | Status: AC
  Filled 2017-05-28: qty 10

## 2017-05-28 MED ORDER — FENTANYL CITRATE (PF) 250 MCG/5ML IJ SOLN
INTRAMUSCULAR | Status: AC
  Filled 2017-05-28: qty 5

## 2017-05-28 MED ORDER — IOPAMIDOL (ISOVUE-370) INJECTION 76%
INTRAVENOUS | Status: AC
  Administered 2017-05-28: 100 mL
  Filled 2017-05-28: qty 50

## 2017-05-28 MED ORDER — ALBUTEROL SULFATE (2.5 MG/3ML) 0.083% IN NEBU: 2.5000 mg | INHALATION_SOLUTION | Freq: Once | RESPIRATORY_TRACT | Status: DC

## 2017-05-28 MED ORDER — LIDOCAINE 2% (20 MG/ML) 5 ML SYRINGE
INTRAMUSCULAR | Status: AC
  Filled 2017-05-28: qty 5

## 2017-05-28 MED ORDER — ALBUTEROL (5 MG/ML) CONTINUOUS INHALATION SOLN
2.5000 mg | INHALATION_SOLUTION | Freq: Four times a day (QID) | RESPIRATORY_TRACT | Status: DC | PRN
  Filled 2017-05-28: qty 20

## 2017-05-28 MED ORDER — PROPOFOL 10 MG/ML IV BOLUS
INTRAVENOUS | Status: AC
  Filled 2017-05-28: qty 20

## 2017-05-28 MED ORDER — METHOCARBAMOL 500 MG PO TABS
1000.0000 mg | ORAL_TABLET | Freq: Four times a day (QID) | ORAL | Status: DC
Start: 2017-05-28 — End: 2017-06-11
  Administered 2017-05-28 – 2017-06-11 (×54): 1000 mg via ORAL
  Filled 2017-05-28 (×56): qty 2

## 2017-05-28 MED ORDER — CHLORHEXIDINE GLUCONATE 4 % EX LIQD
CUTANEOUS | Status: AC
  Administered 2017-05-28: 06:00:00 via TOPICAL
  Filled 2017-05-28: qty 15

## 2017-05-28 MED ORDER — CEFAZOLIN SODIUM-DEXTROSE 2-4 GM/100ML-% IV SOLN
2.0000 g | INTRAVENOUS | Status: AC
  Filled 2017-05-28: qty 100

## 2017-05-28 MED ORDER — LORAZEPAM 2 MG/ML IJ SOLN
0.5000 mg | Freq: Three times a day (TID) | INTRAMUSCULAR | Status: DC | PRN
  Administered 2017-05-29 – 2017-06-02 (×2): 0.5 mg via INTRAVENOUS
  Filled 2017-05-28 (×2): qty 1

## 2017-05-28 MED ORDER — ONDANSETRON HCL 4 MG/2ML IJ SOLN
INTRAMUSCULAR | Status: AC
  Filled 2017-05-28: qty 2

## 2017-05-28 MED ORDER — SUCCINYLCHOLINE CHLORIDE 200 MG/10ML IV SOSY
PREFILLED_SYRINGE | INTRAVENOUS | Status: AC
  Filled 2017-05-28: qty 10

## 2017-05-28 MED ORDER — PHENYLEPHRINE 40 MCG/ML (10ML) SYRINGE FOR IV PUSH (FOR BLOOD PRESSURE SUPPORT)
PREFILLED_SYRINGE | INTRAVENOUS | Status: AC
  Filled 2017-05-28: qty 10

## 2017-05-28 MED ORDER — MIDAZOLAM HCL 2 MG/2ML IJ SOLN
INTRAMUSCULAR | Status: AC
  Filled 2017-05-28: qty 2

## 2017-05-28 NOTE — Consult Note (Signed)
Medical Consultation   Wayne Grimes  ONG:[id number]  DOB: 1986-03-19  DOA: 05/27/2017  PCP: Patient, No Pcp Per    Requesting physician: Ainsley Spinner, MD  Reason for consultation: O2 desats     History of Present Illness: Wayne Grimes is an 31 y.o. male with a history of ALL followed at [facility], History of DVT in 08/2012 treated with Arixtra ,admitted by Ortho Service after falling 44feet from a ladder sustaining multiple fractures. Transferred from [facility] to [facility] for ORIF of the right tibia shaft fracture today. However, patient was noted to be desated, and tachycardic. At the time no tachypnea was noted. He was placed on 3 L O2 with good response, now 98. Continues to be tachycardic.  Denies pleuritic chest pain or palpitations,  Patient denies any fever, chills or night sweats. Denies lower extremity calf pain, but does have swelling due to fall.  Denies pre-syncopal episodes,  or hemoptysis. Denies any bleeding issues such as epistaxis, hematemesis, hematuria or hematochezia. Denies tobacco use. Patient denies taking hormone replacement therapy  Denies taking NSAIDs or daily ASA . No recent long distance trips. Had recent pneumonia 2 weeks prior treated with Levaquin  . Patient placed on Levaquin and Albuterol nebs   Review of Systems:  As per HPI otherwise all other systems reviewed and are negative    Past Medical History: Past Medical History:  Diagnosis Date  . Cancer ([place])    leukemia    Past Surgical History: History reviewed. No pertinent surgical history.   Allergies:   Allergies  Allergen Reactions  . Vancomycin Other (See Comments)    Reaction 12/24/15 and drug fevers noted 01/15/16     Social History: Social History   Social History  . Marital status: Single    Spouse name: N/A  . Number of children: N/A  . Years of education: N/A   Occupational History  . Not on file.   Social History Main Topics    . Smoking status: Former Smoker    Packs/day: 0.10    Years: 2.00    Types: Cigarettes    Quit date: 06/03/2011  . Smokeless tobacco: Never Used  . Alcohol use 1.8 oz/week    3 Cans of beer per week  . Drug use: No  . Sexual activity: Yes   Other Topics Concern  . Not on file   Social History Narrative  . No narrative on file       Family History: History reviewed. No pertinent family history.  Family history reviewed and not pertinent    Physical Exam: Vitals:   05/27/17 2007 05/27/17 2201 05/28/17 0538 05/28/17 0940  BP: 103/79 (!) 143/87 115/79 125/82  Pulse: (!) 116 (!) 107 (!) 120 (!) 118  Resp: (!) 26 20 18    Temp:  97.7 F (36.5 C) 98.7 F (37.1 C) 99 F (37.2 C)  TempSrc:  Oral Oral Oral  SpO2: 92% 95% 95% 96%  Weight:      Height:        Constitutional: Appears calm,  alert and awake, oriented x3, not in any acute distress. Eyes: PERLA, EOMI, irises appear normal, anicteric sclera,  ENMT: external ears and nose appear normal, normal hearing  Lips appears normal, oropharynx mucosa,   Neck: neck appears normal, no masses, normal ROM, no thyromegaly, no JVD  CVS:  Tachycardic S1-S2 clear, no murmur rubs or gallops. No apparent  LE edema , normal pedal pulses  Respiratory:  no wheezing, trace of  rales or mild  Rhonchi LLL  Respiratory effort normal. No accessory muscle use.  Abdomen: soft nontender, nondistended, normal bowel sounds, no hepatosplenomegaly, no hernias  Musculoskeletal: no cyanosis, clubbing or edema noted bilaterally. L knee and R lower leg immobilized  Neuro: Cranial nerves II-XII intact, strength, sensation, reflexes Psych: judgement and insight appear normal, stable mood and affect, mental status Skin: no rashes or lesions or ulcers, no induration or nodules   Data reviewed:  I have personally reviewed following labs and imaging studies Labs:  CBC:  Recent Labs Lab 05/27/17 1959 05/27/17 2240  WBC 5.5 5.5  NEUTROABS 4.1  --    HGB 10.9* 10.6*  HCT 33.2* 32.7*  MCV 93.0 92.6  PLT 185 160    Basic Metabolic Panel:  Recent Labs Lab 05/27/17 1959 05/27/17 2240  NA 140  --   K 3.3*  --   CL 108  --   CO2 24  --   GLUCOSE 161*  --   BUN 12  --   CREATININE 0.75 0.69  CALCIUM 8.2*  --    GFR Estimated Creatinine Clearance: 152 mL/min (by C-G formula based on SCr of 0.69 mg/dL). Liver Function Tests: No results for input(s): AST, ALT, ALKPHOS, BILITOT, PROT, ALBUMIN in the last 168 hours. No results for input(s): LIPASE, AMYLASE in the last 168 hours. No results for input(s): AMMONIA in the last 168 hours. Coagulation profile No results for input(s): INR, PROTIME in the last 168 hours.  Cardiac Enzymes: No results for input(s): CKTOTAL, CKMB, CKMBINDEX, TROPONINI in the last 168 hours. BNP: Invalid input(s): POCBNP CBG: No results for input(s): GLUCAP in the last 168 hours. D-Dimer No results for input(s): DDIMER in the last 72 hours. Hgb A1c No results for input(s): HGBA1C in the last 72 hours. Lipid Profile No results for input(s): CHOL, HDL, LDLCALC, TRIG, CHOLHDL, LDLDIRECT in the last 72 hours. Thyroid function studies No results for input(s): TSH, T4TOTAL, T3FREE, THYROIDAB in the last 72 hours.  Invalid input(s): FREET3 Anemia work up No results for input(s): VITAMINB12, FOLATE, FERRITIN, TIBC, IRON, RETICCTPCT in the last 72 hours. Urinalysis No results found for: COLORURINE, APPEARANCEUR, [place], [place], [place], [place], [place], [place], PROTEINUR, UROBILINOGEN, NITRITE, LEUKOCYTESUR   Sepsis Labs Invalid input(s): PROCALCITONIN,  WBC,  LACTICIDVEN Microbiology Recent Results (from the past 240 hour(s))  Surgical pcr screen     Status: None   Collection Time: 05/27/17 11:14 PM  Result Value Ref Range Status   MRSA, PCR NEGATIVE NEGATIVE Final   Staphylococcus aureus NEGATIVE NEGATIVE Final    Comment: (NOTE) The Xpert SA Assay (FDA approved for NASAL specimens in  patients 49 years of age and older), is one component of a comprehensive surveillance program. It is not intended to diagnose infection nor to guide or monitor treatment.        Inpatient Medications:   Scheduled Meds: . albuterol  2.5 mg Nebulization Once  . dasatinib  70 mg Oral QHS  . docusate sodium  100 mg Oral BID  . [START ON 05/29/2017] enoxaparin (LOVENOX) injection  40 mg Subcutaneous Q24H  . ketorolac  15 mg Intravenous Q8H  . methocarbamol  1,000 mg Oral QID   Continuous Infusions: . sodium chloride 100 mL/hr at 05/28/17 0606  . 0.9 % NaCl with KCl 20 mEq / L    . [START ON 05/29/2017]  ceFAZolin (ANCEF) IV    . levofloxacin (  LEVAQUIN) IV    . methocarbamol (ROBAXIN)  IV       Radiological Exams on Admission: Dg Knee 2 Views Right  Result Date: 05/27/2017 CLINICAL DATA:  Initial evaluation for acute trauma, fall. EXAM: RIGHT KNEE - 1-2 VIEW COMPARISON:  None. FINDINGS: There is an acute comminuted fracture involving the proximal right fibula with mild posterior and medial displacement. Proximal tibia and distal femur intact. No joint effusion. No other soft tissue abnormality. IMPRESSION: Acute comminuted mildly displaced fracture of the proximal right fibula. Electronically Signed   By: Jeannine Boga M.D.   On: 05/27/2017 16:30   Dg Tibia/fibula Right  Result Date: 05/27/2017 CLINICAL DATA:  Initial evaluation for acute trauma, fall. EXAM: RIGHT TIBIA AND FIBULA - 2 VIEW COMPARISON:  None. FINDINGS: There is an acute oblique comminuted fracture through the mid- distal right tibial shaft with slight lateral displacement. Additional comminuted minimally displaced fracture of the proximal right fibular shaft. No soft tissue abnormality. IMPRESSION: 1. Acute oblique comminuted fracture of the mid-distal right tibial shaft with slight lateral displacement. 2. Acute comminuted minimally displaced proximal right fibular shaft fracture. Electronically Signed   By:  Jeannine Boga M.D.   On: 05/27/2017 16:32   Dg Ankle 2 Views Right  Result Date: 05/27/2017 CLINICAL DATA:  Initial evaluation for acute trauma, fall. EXAM: RIGHT ANKLE - 2 VIEW COMPARISON:  None. FINDINGS: Acute comminuted oblique fracture of the mid-distal right tibial shaft with slight lateral displacement. Additional acute comminuted fracture of the right calcaneus with intra-articular extension into the subtalar joint no other acute injury. Mild soft tissue swelling about the ankle. IMPRESSION: 1. Acute oblique comminuted fracture of the mid-distal right tibial shaft with slight lateral displacement. 2. Acute comminuted right calcaneal fracture with intra-articular extension. Electronically Signed   By: Jeannine Boga M.D.   On: 05/27/2017 16:33   Ct Knee Left Wo Contrast  Result Date: 05/27/2017 CLINICAL DATA:  [place] off roof. Tibial plateau fracture on plain films. EXAM: CT OF THE LEFT KNEE WITHOUT CONTRAST TECHNIQUE: Multidetector CT imaging of the LEFT knee was performed according to the standard protocol. Multiplanar CT image reconstructions were also generated. COMPARISON:  Plain films earlier today FINDINGS: There is a lateral tibial plateau fracture noted. Slight depression of the lateral tibial plateau. Large associated joint effusion/ lipohemarthrosis. No additional fracture noted. No subluxation or dislocation. IMPRESSION: Slightly depressed lateral tibial plateau fracture with associated large lipohemarthrosis. Electronically Signed   By: Rolm Baptise M.D.   On: 05/27/2017 20:07   Ct Knee Right Wo Contrast  Result Date: 05/28/2017 CLINICAL DATA:  31 year old male with bilateral lower extremity pain following fall. EXAM: CT OF THE right KNEE WITHOUT CONTRAST TECHNIQUE: Multidetector CT imaging of the right knee was performed according to the standard protocol. Multiplanar CT image reconstructions were also generated. COMPARISON:  Right knee radiograph dated 05/27/2017  FINDINGS: Bones/Joint/Cartilage There is a mildly displaced comminuted oblique fracture of the proximal fibula. The remainder of the visualized bones appear intact. There is no dislocation. Ligaments Suboptimally assessed by CT. Muscles and Tendons No acute findings. No large intramuscular hematoma. Mild edema of the calf muscles. Soft tissues Mild diffuse subcutaneous edema as well as mild edema in the suprapatellar fat and in the Hoffa's fat pad. No fluid collection. IMPRESSION: Comminuted, mildly displaced oblique fracture of the proximal fibula. Electronically Signed   By: Anner Crete M.D.   On: 05/28/2017 00:12   Ct Foot Right Wo Contrast  Result Date: 05/27/2017 CLINICAL DATA:  Right foot pain after falling approximately 12 feet. Comminuted calcaneal fracture with intra-articular extension on foot radiographs earlier today. The patient also has a tibial shaft fracture. EXAM: CT OF THE RIGHT FOOT WITHOUT CONTRAST TECHNIQUE: Multidetector CT imaging of the right foot was performed according to the standard protocol. Multiplanar CT image reconstructions were also generated. COMPARISON:  Right foot radiographs obtained earlier today. FINDINGS: Bones/Joint/Cartilage Markedly comminuted calcaneus fracture involving the posterior and middle subtalar joints. No other fractures are seen. Dorsal talonavicular and navicular middle cuneiform spur formation. Ligaments Suboptimally assessed by CT. Muscles and Tendons Intact. Soft tissues Diffuse subcutaneous edema. IMPRESSION: 1. Markedly comminuted calcaneus fracture involving the posterior and middle subtalar joints. 2. Dorsal tarsal degenerative spur formation. Electronically Signed   By: Claudie Revering M.D.   On: 05/27/2017 19:16   Dg Chest Port 1 View  Result Date: 05/28/2017 CLINICAL DATA:  Hypoxia EXAM: PORTABLE CHEST 1 VIEW COMPARISON:  06/02/2012 FINDINGS: Cardiac shadow is within normal limits. Right-sided chest wall port is now seen in satisfactory  position. Lungs are hypoinflated with very minimal right basilar atelectatic changes. No pneumothorax is seen. No acute bony abnormality is noted. IMPRESSION: Hypoaeration with mild right basilar atelectasis. Electronically Signed   By: Inez Catalina M.D.   On: 05/28/2017 07:59   Dg Knee Complete 4 Views Left  Result Date: 05/27/2017 CLINICAL DATA:  Fall with knee pain EXAM: LEFT KNEE - COMPLETE 4+ VIEW COMPARISON:  None. FINDINGS: Large suprapatellar effusion with fat fluid level. Mildly depressed lateral tibial plateau fracture. No dislocation. IMPRESSION: Large knee effusion with mildly depressed lateral tibial plateau fracture Electronically Signed   By: Donavan Foil M.D.   On: 05/27/2017 18:12   Dg Foot 2 Views Right  Result Date: 05/27/2017 CLINICAL DATA:  Initial evaluation for acute trauma, fall. EXAM: RIGHT FOOT - 2 VIEW COMPARISON:  None. FINDINGS: Acute comminuted fracture of the right calcaneus with intra-articular extension into the subtalar joint. No other acute fracture or dislocation about the foot. IMPRESSION: 1. Acute comminuted right calcaneal fracture with intra-articular extension. 2. No other acute traumatic injury about the foot. Electronically Signed   By: Jeannine Boga M.D.   On: 05/27/2017 16:35    Impression/Recommendations Principal Problem:   Accidental fall from ladder Active Problems:   Displaced comminuted fracture of shaft of right tibia, initial encounter for closed fracture   Displaced fracture of body of right calcaneus, initial encounter for closed fracture   Closed fracture of lateral portion of left tibial plateau   History of DVT (deep vein thrombosis)  Acute O2 desat, requiring O2 [place], currently therapeutic in  3 L. Patient carries a history of DVT in 08/2012 treated with Arixtra at [facility], and recent pneumonia 2 weeks prior to admission . No respiratory distress CT A  Pending  EKG   Tmax 99  Tachycardic Risk factors include h/o  malignancy, recent fractures. Doppler LE Await for the CT angio results  Continue Levaquin Nebs prn,  May need full dose anticoagulation pending on the results.   History of ALL, followed by Dr. Florene Glen at [facility] Current WBC 5.5, normal platelets.  No acute issues, may need Heme  consult if  WBC increases    Accidental fall from ladder, 12 ft, with subsequent multiple fractures .  Surgery pending on the CT angio results and optimal respiratory status   PLans as per Ortho   Thank you for this consultation.  Our [facility] hospitalist team will follow the patient with you.  Nelson PA-C [facility] 05/28/2017, 10:16 AM

## 2017-05-28 NOTE — Anesthesia Preprocedure Evaluation (Addendum)
Anesthesia Evaluation  Patient identified by MRN, date of birth, ID band Patient awake    Reviewed: Allergy & Precautions, NPO status , reviewed documented beta blocker date and time   Airway Mallampati: II  TM Distance: >3 FB Neck ROM: Full    Dental  (+) Teeth Intact   Pulmonary former smoker,     + decreased breath sounds      Cardiovascular negative cardio ROS   Rhythm:Regular Rate:Normal     Neuro/Psych    GI/Hepatic negative GI ROS, Neg liver ROS,   Endo/Other  negative endocrine ROS  Renal/GU negative Renal ROS     Musculoskeletal   Abdominal   Peds  Hematology Hx of leukemia   Anesthesia Other Findings   Reproductive/Obstetrics                           Anesthesia Physical Anesthesia Plan  ASA: III  Anesthesia Plan: General   Post-op Pain Management:    Induction: Intravenous  PONV Risk Score and Plan: 3 and Ondansetron, Dexamethasone, Midazolam and Propofol infusion  Airway Management Planned: Oral ETT  Additional Equipment:   Intra-op Plan:   Post-operative Plan: Extubation in OR  Informed Consent: I have reviewed the patients History and Physical, chart, labs and discussed the procedure including the risks, benefits and alternatives for the proposed anesthesia with the patient or authorized representative who has indicated his/her understanding and acceptance.   Dental advisory given  Plan Discussed with: CRNA  Anesthesia Plan Comments:        Anesthesia Quick Evaluation

## 2017-05-28 NOTE — Progress Notes (Signed)
Patient transported back to floor with nurse and transporter.  Patient given 1 mg Dilaudid prior to transport.

## 2017-05-28 NOTE — Progress Notes (Signed)
Report given to [facility] on 5N.  Patient on 3L and O2 sats are 98%.  Received orders for patient to be on regular diet.

## 2017-05-28 NOTE — Consult Note (Signed)
Orthopaedic Trauma Service (OTS) Consult   Patient ID: Koki Buxton MRN: [id number] DOB/AGE: Jan 03, 1986 31 y.o.  Late entry note, pt seen in holding area this am   Reason for Consult: multiple bilateral lower extremity fractures  Referring Physician: Eli Hose, MD    HPI: Malakhai Beitler is an 31 y.o. Hispanic male who sustained a fall of approximately 12 feet yesterday after falling off of a ladder.  Patient was taken to [facility] where he was found to have numerous fractures in both legs.  Orthopedics on-call at [facility] felt that he needed transfer to a higher level institution.  Patient was subsequently transferred to [facility].  Patient was seen and evaluated by the on-call orthopedist.  Patient felt that the orthopedic trauma service would be best suited to manage the patient.  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  He was recently diagnosed with community-acquired pneumonia and was started on oral Levaquin on 05/19/2017.  Patient was seen and evaluated by the orthopedic trauma service in the operative holding area.  Patient was tachycardic, hypoxic and tachypneic.  Felt that it was unsafe to proceed with surgical intervention today.  Hospitalist consult will be obtained as well.  Preoperative chest x-ray did not show any obvious infiltrates.  His labs were relatively unremarkable.  WBC count, H&H and platelets were all appropriate considering his past medical history.  Additionally in the holding area his compartments were felt to be mildly tense.  We did perform intracompartmental pressure assessment at the bedside to determine if acute surgical intervention with 4 compartment fasciotomies for his right leg was necessary along with intramedullary nailing.  Fortunately his compartmental pressures were appropriate.  His diastolic  pressures in the operative holding area were in the high 70s-80s.  All of his compartmental pressures were in the low to mid 30s with the deep posterior compartment being the highest at 34 mmHg.    Patient is Spanish-speaking.  No family at bedside.  All communication done via video interpreter.  Definite language barrier and comprehension barrier exists when trying to ascertain information even with interpreter.  Past Medical History:  Diagnosis Date  . Cancer [facility])    leukemia    History reviewed. No pertinent surgical history.  History reviewed. No pertinent family history.  Social History:  reports that he quit smoking about 5 years ago. His smoking use included Cigarettes. He has a 0.20 pack-year smoking history. He has never used smokeless tobacco. He reports that he drinks about 1.8 oz of alcohol per week . He reports that he does not use drugs.  Allergies:  Allergies  Allergen Reactions  . Vancomycin Other (See Comments)    Reaction 12/24/15 and drug fevers noted 01/15/16    Medications:  Current Meds  Medication Sig  . dasatinib (SPRYCEL) 70 MG tablet Take 70 mg by mouth at bedtime.   Marland Kitchen levofloxacin (LEVAQUIN) 750 MG tablet Take 750 mg by mouth daily. 14 day course starting on 05/19/2017     Results for orders placed or performed during the hospital encounter of 05/27/17 (from the past 48 hour(s))  CBC with Differential     Status: Abnormal   Collection Time: 05/27/17  7:59 PM  Result Value Ref Range   WBC 5.5 4.0 - 10.5 K/uL   RBC 3.57 (L) 4.22 - 5.81 MIL/uL   Hemoglobin 10.9 (L)  13.0 - 17.0 g/dL   HCT 33.2 (L) 39.0 - 52.0 %   MCV 93.0 78.0 - 100.0 fL   MCH 30.5 26.0 - 34.0 pg   MCHC 32.8 30.0 - 36.0 g/dL   RDW 14.7 11.5 - 15.5 %   Platelets 185 150 - 400 K/uL   Neutrophils Relative % 72 %   Neutro Abs 4.1 1.7 - 7.7 K/uL   Lymphocytes Relative 18 %   Lymphs Abs 1.0 0.7 - 4.0 K/uL   Monocytes Relative 9 %   Monocytes Absolute 0.5 0.1 - 1.0 K/uL   Eosinophils  Relative 1 %   Eosinophils Absolute 0.0 0.0 - 0.7 K/uL   Basophils Relative 0 %   Basophils Absolute 0.0 0.0 - 0.1 K/uL  Basic metabolic panel     Status: Abnormal   Collection Time: 05/27/17  7:59 PM  Result Value Ref Range   Sodium 140 135 - 145 mmol/L   Potassium 3.3 (L) 3.5 - 5.1 mmol/L   Chloride 108 101 - 111 mmol/L   CO2 24 22 - 32 mmol/L   Glucose, Bld 161 (H) 65 - 99 mg/dL   BUN 12 6 - 20 mg/dL   Creatinine, Ser 0.75 0.61 - 1.24 mg/dL   Calcium 8.2 (L) 8.9 - 10.3 mg/dL   GFR calc non Af Amer >60 >60 mL/min   GFR calc Af Amer >60 >60 mL/min    Comment: (NOTE) The eGFR has been calculated using the CKD EPI equation. This calculation has not been validated in all clinical situations. eGFR's persistently <60 mL/min signify possible Chronic Kidney Disease.    Anion gap 8 5 - 15  HIV antibody (Routine Testing)     Status: None   Collection Time: 05/27/17 10:40 PM  Result Value Ref Range   HIV Screen 4th Generation wRfx Non Reactive Non Reactive    Comment: (NOTE) Performed At: [facility] [place], [place] [id number] Lindon Romp MD PF:[id number]   CBC     Status: Abnormal   Collection Time: 05/27/17 10:40 PM  Result Value Ref Range   WBC 5.5 4.0 - 10.5 K/uL   RBC 3.53 (L) 4.22 - 5.81 MIL/uL   Hemoglobin 10.6 (L) 13.0 - 17.0 g/dL   HCT 32.7 (L) 39.0 - 52.0 %   MCV 92.6 78.0 - 100.0 fL   MCH 30.0 26.0 - 34.0 pg   MCHC 32.4 30.0 - 36.0 g/dL   RDW 14.8 11.5 - 15.5 %   Platelets 180 150 - 400 K/uL  Creatinine, serum     Status: None   Collection Time: 05/27/17 10:40 PM  Result Value Ref Range   Creatinine, Ser 0.69 0.61 - 1.24 mg/dL   GFR calc non Af Amer >60 >60 mL/min   GFR calc Af Amer >60 >60 mL/min    Comment: (NOTE) The eGFR has been calculated using the CKD EPI equation. This calculation has not been validated in all clinical situations. eGFR's persistently <60 mL/min signify possible Chronic Kidney Disease.   Surgical pcr  screen     Status: None   Collection Time: 05/27/17 11:14 PM  Result Value Ref Range   MRSA, PCR NEGATIVE NEGATIVE   Staphylococcus aureus NEGATIVE NEGATIVE    Comment: (NOTE) The Xpert SA Assay (FDA approved for NASAL specimens in patients 26 years of age and older), is one component of a comprehensive surveillance program. It is not intended to diagnose infection nor to guide or monitor treatment.  Dg Knee 2 Views Right  Result Date: 05/27/2017 CLINICAL DATA:  Initial evaluation for acute trauma, fall. EXAM: RIGHT KNEE - 1-2 VIEW COMPARISON:  None. FINDINGS: There is an acute comminuted fracture involving the proximal right fibula with mild posterior and medial displacement. Proximal tibia and distal femur intact. No joint effusion. No other soft tissue abnormality. IMPRESSION: Acute comminuted mildly displaced fracture of the proximal right fibula. Electronically Signed   By: Jeannine Boga M.D.   On: 05/27/2017 16:30   Dg Tibia/fibula Right  Result Date: 05/27/2017 CLINICAL DATA:  Initial evaluation for acute trauma, fall. EXAM: RIGHT TIBIA AND FIBULA - 2 VIEW COMPARISON:  None. FINDINGS: There is an acute oblique comminuted fracture through the mid- distal right tibial shaft with slight lateral displacement. Additional comminuted minimally displaced fracture of the proximal right fibular shaft. No soft tissue abnormality. IMPRESSION: 1. Acute oblique comminuted fracture of the mid-distal right tibial shaft with slight lateral displacement. 2. Acute comminuted minimally displaced proximal right fibular shaft fracture. Electronically Signed   By: Jeannine Boga M.D.   On: 05/27/2017 16:32   Dg Ankle 2 Views Right  Result Date: 05/27/2017 CLINICAL DATA:  Initial evaluation for acute trauma, fall. EXAM: RIGHT ANKLE - 2 VIEW COMPARISON:  None. FINDINGS: Acute comminuted oblique fracture of the mid-distal right tibial shaft with slight lateral displacement. Additional acute  comminuted fracture of the right calcaneus with intra-articular extension into the subtalar joint no other acute injury. Mild soft tissue swelling about the ankle. IMPRESSION: 1. Acute oblique comminuted fracture of the mid-distal right tibial shaft with slight lateral displacement. 2. Acute comminuted right calcaneal fracture with intra-articular extension. Electronically Signed   By: Jeannine Boga M.D.   On: 05/27/2017 16:33    Ct Knee Left Wo Contrast  Result Date: 05/27/2017 CLINICAL DATA:  [place] off roof. Tibial plateau fracture on plain films. EXAM: CT OF THE LEFT KNEE WITHOUT CONTRAST TECHNIQUE: Multidetector CT imaging of the LEFT knee was performed according to the standard protocol. Multiplanar CT image reconstructions were also generated. COMPARISON:  Plain films earlier today FINDINGS: There is a lateral tibial plateau fracture noted. Slight depression of the lateral tibial plateau. Large associated joint effusion/ lipohemarthrosis. No additional fracture noted. No subluxation or dislocation. IMPRESSION: Slightly depressed lateral tibial plateau fracture with associated large lipohemarthrosis. Electronically Signed   By: Rolm Baptise M.D.   On: 05/27/2017 20:07   Ct Knee Right Wo Contrast  Result Date: 05/28/2017 CLINICAL DATA:  31 year old male with bilateral lower extremity pain following fall. EXAM: CT OF THE right KNEE WITHOUT CONTRAST TECHNIQUE: Multidetector CT imaging of the right knee was performed according to the standard protocol. Multiplanar CT image reconstructions were also generated. COMPARISON:  Right knee radiograph dated 05/27/2017 FINDINGS: Bones/Joint/Cartilage There is a mildly displaced comminuted oblique fracture of the proximal fibula. The remainder of the visualized bones appear intact. There is no dislocation. Ligaments Suboptimally assessed by CT. Muscles and Tendons No acute findings. No large intramuscular hematoma. Mild edema of the calf muscles. Soft  tissues Mild diffuse subcutaneous edema as well as mild edema in the suprapatellar fat and in the Hoffa's fat pad. No fluid collection. IMPRESSION: Comminuted, mildly displaced oblique fracture of the proximal fibula. Electronically Signed   By: Anner Crete M.D.   On: 05/28/2017 00:12   Ct Foot Right Wo Contrast  Result Date: 05/27/2017 CLINICAL DATA:  Right foot pain after falling approximately 12 feet. Comminuted calcaneal fracture with intra-articular extension on foot radiographs earlier today.  The patient also has a tibial shaft fracture. EXAM: CT OF THE RIGHT FOOT WITHOUT CONTRAST TECHNIQUE: Multidetector CT imaging of the right foot was performed according to the standard protocol. Multiplanar CT image reconstructions were also generated. COMPARISON:  Right foot radiographs obtained earlier today. FINDINGS: Bones/Joint/Cartilage Markedly comminuted calcaneus fracture involving the posterior and middle subtalar joints. No other fractures are seen. Dorsal talonavicular and navicular middle cuneiform spur formation. Ligaments Suboptimally assessed by CT. Muscles and Tendons Intact. Soft tissues Diffuse subcutaneous edema. IMPRESSION: 1. Markedly comminuted calcaneus fracture involving the posterior and middle subtalar joints. 2. Dorsal tarsal degenerative spur formation. Electronically Signed   By: Claudie Revering M.D.   On: 05/27/2017 19:16   Dg Chest Port 1 View  Result Date: 05/28/2017 CLINICAL DATA:  Hypoxia EXAM: PORTABLE CHEST 1 VIEW COMPARISON:  06/02/2012 FINDINGS: Cardiac shadow is within normal limits. Right-sided chest wall port is now seen in satisfactory position. Lungs are hypoinflated with very minimal right basilar atelectatic changes. No pneumothorax is seen. No acute bony abnormality is noted. IMPRESSION: Hypoaeration with mild right basilar atelectasis. Electronically Signed   By: Inez Catalina M.D.   On: 05/28/2017 07:59   Dg Knee Complete 4 Views Left  Result Date:  05/27/2017 CLINICAL DATA:  Fall with knee pain EXAM: LEFT KNEE - COMPLETE 4+ VIEW COMPARISON:  None. FINDINGS: Large suprapatellar effusion with fat fluid level. Mildly depressed lateral tibial plateau fracture. No dislocation. IMPRESSION: Large knee effusion with mildly depressed lateral tibial plateau fracture Electronically Signed   By: Donavan Foil M.D.   On: 05/27/2017 18:12   Dg Foot 2 Views Right  Result Date: 05/27/2017 CLINICAL DATA:  Initial evaluation for acute trauma, fall. EXAM: RIGHT FOOT - 2 VIEW COMPARISON:  None. FINDINGS: Acute comminuted fracture of the right calcaneus with intra-articular extension into the subtalar joint. No other acute fracture or dislocation about the foot. IMPRESSION: 1. Acute comminuted right calcaneal fracture with intra-articular extension. 2. No other acute traumatic injury about the foot. Electronically Signed   By: Jeannine Boga M.D.   On: 05/27/2017 16:35    Review of Systems  Constitutional: Negative for fever.  Cardiovascular: Negative for chest pain and palpitations.  Gastrointestinal: Negative for nausea and vomiting.  Genitourinary: Negative for dysuria and urgency.  Neurological: Positive for tingling (Right leg).   Blood pressure 129/86, pulse (!) 116, temperature 98.7 F (37.1 C), temperature source Oral, resp. rate 18, height 5' 6"  (1.676 m), weight 101.6 kg (224 lb), SpO2 96 %. Physical Exam  Constitutional: He is oriented to person, place, and time. He is cooperative.  Uncomfortable appearing  HENT:  Head: Normocephalic and atraumatic.  Mouth/Throat: Oropharynx is clear and moist.  Cardiovascular: Regular rhythm.  Tachycardia present.   Pulmonary/Chest: Tachypnea noted. He has rhonchi.  Rhonchi bilateral lower fields  Abdominal:  Soft, nontender, nondistended, + bowel sounds  Musculoskeletal:  Bilateral upper extremities       shoulder, elbow, wrist, digits- no skin wounds, nontender, no instability, no blocks to  motion  Sens  Ax/R/M/U intact  Mot   Ax/ R/ PIN/ M/ AIN/ U intact  Rad 2+  Pelvis    no traumatic wounds or rash, no ecchymosis, stable to manual stress, nontender  Right lower extremity Inspection: Posterior long leg splint in place Moderate swelling Bony eval: Hip is nontender, no pain with axial loading or logrolling of his hip Knee is nontender with the exception of tenderness to palpation of his proximal tibia Exquisite tenderness palpation of the  midshaft tibia Unable to palpate ankle or foot as patient is splinted splint not removed to evaluate all of the soft tissues as it is maintaining stability  Soft tissue: Moderate swelling present Compartments are full but compressible.  Patient does have tenderness with palpation of his compartments primarily posterior compartments patient does not demonstrate any real pain out of proportion with passive stretching of his toes No open wounds appreciated.  Splint not removed completely to fully evaluate all of the soft tissue  Ligamentous stability of the knee and ankle not performed as the patient is splinted  Sensation:    DPN, SPN, TN sensory functions are grossly intact to light touch Motor:    EHL, FHL and lesser toe motor functions are grossly intact Vascular:    DP pulse, ext is warm   Left lower extremity Inspection: Knee immobilizer is in place No gross deformities Bony eval: Tenderness palpation of the proximal tibia No tenderness with evaluation of the left hip.  No pain with axial loading or logrolling of his left hip. Nontender to palpation distal femur  and patella  lower leg, ankle and foot are nontender Soft tissue: Minimal swelling to the left lower extremity Skin wrinkles with ease over the proximal tibia Minimal to mild effusion noted to the left knee Knee is grossly stable with gentle varus and valgus stressing in full extension and 30 degrees flexion Ankle is stable Sensation: Deep peroneal nerve,  superficial peroneal nerve and tibial nerve sensory function are intact Motor: EHL, FHL, anterior tibialis, posterior tibialis, peroneals and gastrocsoleus complex motor functions are grossly intact Vascular: + DP pulse Extremity is warm Compartments are soft and nontender, no pain with passive stretching   Neurological: He is alert and oriented to person, place, and time.  Nursing note and vitals reviewed.    Assessment/Plan:  31 year old Hispanic male follow-up ladder approximately 12 feet with multiple orthopedic injuries, active ALL treatment  -Fall off ladder  -Multiple orthopedic injuries  Comminuted right tibial shaft fracture  Comminuted right proximal fibula fracture  Comminuted intra-articular right calcaneus fracture  Minimally displaced left lateral tibial plateau fracture with joint depression  Patient will need surgical intervention to address his right tibial shaft, right calcaneus and left tibial plateau  Surgery delayed today due to cardiopulmonary compromise.  We will obtain formal medical consult for further evaluation however given findings of tachycardia, hypoxia, tachypnea, recent treatment for pneumonia along with with active oncologic diagnosis as well as a history of DVT we will proceed with CT angiogram of his chest to evaluate for pulmonary embolus.  This will also enable [place] to see whether or not his pneumonia has resolved.   Hopeful to proceed with surgery tomorrow Would anticipate intramedullary nailing of his right tibia and percutaneous fixation of his left tibial plateau tomorrow.  His right calcaneus may need 10-14 days for soft tissue swelling to resolve before proceeding with ORIF however if his swelling is minimal on evaluation tomorrow we could consider a limited approach for fixation.  As noted above we did perform compartment pressure evaluation at the bedside.  Findings were favorable which did not demonstrate compartment syndrome.  Compartmental  pressures of his anterior, lateral, superficial posterior and deep posterior compartments were  in the low 30s with delta P is greater than 30.  We will continue with strict monitoring for the development of compartment syndrome.  Aggressive ice and elevation to the level of his heart for his right leg.  We did place a bulky compressive  dressing to the left lower extremity as well to provide some swelling control.  Unfortunately given the patient's constellation of injuries he will be nonweightbearing bilaterally for the next 8 weeks.  We may consider allowing him some weightbearing as tolerated for transfers on his left leg at about the 4-6 weeks more.  Given his active chemotherapy treatments  And medical comorbidities he definitely is at risk for complications including nonunion and deep infection.  - Pain management:  Titrate accordingly - ABL anemia/Hemodynamics  Follow labs closely  No acute hematology/oncology issues noted.  We will contact his primary hematologist to make them aware of admission and to provide any recommendations they feel appropriate.  - Medical issues   Internal medicine consultation - DVT/PE prophylaxis:  Hold coagulation for now as we will plan to go to the OR tomorrow.    If CT angiogram is positive for acute PE will place on anticoagulation as indicated.  - ID:   IV Levaquin as ordered  Defer to internal medicine for any changes  - Activity:  Bedrest for now - FEN/GI prophylaxis/Foley/Lines:  N.p.o. after midnight  Regular diet otherwise  IV fluids  - Dispo:  Internal medicine consult  Hopeful for OR tomorrow for fixation of multiple fractures   Jari Pigg, PA-C [facility] [phone] (C) [phone] (O) 05/28/2017, 9:58 PM

## 2017-05-28 NOTE — Progress Notes (Addendum)
Pharmacy Antibiotic Note  Wayne Grimes is a 31 y.o. male with possible pneumonia.  Pharmacy has been consulted for zosyn dosing.  Plan: -Zosyn 3.375gm IV q8h -Consider de-escalation as able -Will follow renal function, cultures and clinical progress   Height: 5\' 6"  (167.6 cm) Weight: 224 lb (101.6 kg) IBW/kg (Calculated) : 63.8  Temp (24hrs), Avg:98.5 F (36.9 C), Min:97.7 F (36.5 C), Max:99 F (37.2 C)   Recent Labs Lab 05/27/17 1959 05/27/17 2240  WBC 5.5 5.5  CREATININE 0.75 0.69    Estimated Creatinine Clearance: 152 mL/min (by C-G formula based on SCr of 0.69 mg/dL).    Allergies  Allergen Reactions  . Vancomycin Other (See Comments)    Reaction 12/24/15 and drug fevers noted 01/15/16    Antimicrobials this admission: 10/25>>>  Dose adjustments this admission:   Thank you for allowing pharmacy to be a part of this patient's care.  Hildred Laser, Pharm D 05/28/2017 3:16 PM

## 2017-05-29 ENCOUNTER — Encounter (HOSPITAL_COMMUNITY)
Admission: EM | Disposition: A | Discharge status: Other Acute Inpatient Hospital | Payer: Self-pay | Source: Home / Self Care | Attending: Student

## 2017-05-29 ENCOUNTER — Inpatient Hospital Stay (HOSPITAL_COMMUNITY): Payer: Worker's Compensation

## 2017-05-29 ENCOUNTER — Inpatient Hospital Stay (HOSPITAL_COMMUNITY): Payer: Worker's Compensation | Admitting: Anesthesiology

## 2017-05-29 DIAGNOSIS — W11XXXA Fall on and from ladder, initial encounter: Secondary | ICD-10-CM

## 2017-05-29 HISTORY — PX: ORIF TIBIA PLATEAU: SHX2132

## 2017-05-29 HISTORY — PX: TIBIA IM NAIL INSERTION: SHX2516

## 2017-05-29 LAB — POCT I-STAT 7, (LYTES, BLD GAS, ICA,H+H)
Acid-Base Excess: 3 mmol/L — ABNORMAL HIGH (ref 0.0–2.0)
Bicarbonate: 27 mmol/L (ref 20.0–28.0)
Bicarbonate: 23.9 mmol/L (ref 20.0–28.0)
Calcium, Ion: 1.05 mmol/L — ABNORMAL LOW (ref 1.15–1.40)
Calcium, Ion: 1.05 mmol/L — ABNORMAL LOW (ref 1.15–1.40)
HCT: 23 % — ABNORMAL LOW (ref 39.0–52.0)
HEMATOCRIT: 22 % — AB (ref 39.0–52.0)
HEMOGLOBIN: 7.8 g/dL — AB (ref 13.0–17.0)
Hemoglobin: 7.5 g/dL — ABNORMAL LOW (ref 13.0–17.0)
O2 SAT: 99 %
O2 Saturation: 99 %
PCO2 ART: 38.3 mmHg (ref 32.0–48.0)
PCO2 ART: 36.6 mmHg (ref 32.0–48.0)
PO2 ART: 144 mmHg — AB (ref 83.0–108.0)
POTASSIUM: 3.6 mmol/L (ref 3.5–5.1)
Patient temperature: 36.7
Potassium: 3.7 mmol/L (ref 3.5–5.1)
Sodium: 137 mmol/L (ref 135–145)
Sodium: 138 mmol/L (ref 135–145)
TCO2: 28 mmol/L (ref 22–32)
TCO2: 25 mmol/L (ref 22–32)
pH, Arterial: 7.455 — ABNORMAL HIGH (ref 7.350–7.450)
pH, Arterial: 7.422 (ref 7.350–7.450)
pO2, Arterial: 130 mmHg — ABNORMAL HIGH (ref 83.0–108.0)

## 2017-05-29 LAB — CBC WITH DIFFERENTIAL/PLATELET
BASOS ABS: 0 10*3/uL (ref 0.0–0.1)
BASOS PCT: 0 %
EOS PCT: 1 %
Eosinophils Absolute: 0.1 10*3/uL (ref 0.0–0.7)
HEMATOCRIT: 30.4 % — AB (ref 39.0–52.0)
Hemoglobin: 9.7 g/dL — ABNORMAL LOW (ref 13.0–17.0)
Lymphocytes Relative: 25 %
Lymphs Abs: 1.3 10*3/uL (ref 0.7–4.0)
MCH: 29.8 pg (ref 26.0–34.0)
MCHC: 31.9 g/dL (ref 30.0–36.0)
MCV: 93.5 fL (ref 78.0–100.0)
MONO ABS: 0.7 10*3/uL (ref 0.1–1.0)
MONOS PCT: 13 %
NEUTROS ABS: 3.2 10*3/uL (ref 1.7–7.7)
Neutrophils Relative %: 61 %
PLATELETS: 127 10*3/uL — AB (ref 150–400)
RBC: 3.25 MIL/uL — ABNORMAL LOW (ref 4.22–5.81)
RDW: 15.5 % (ref 11.5–15.5)
WBC: 5.3 10*3/uL (ref 4.0–10.5)

## 2017-05-29 LAB — CBC
HCT: 29.6 % — ABNORMAL LOW (ref 39.0–52.0)
HEMOGLOBIN: 10 g/dL — AB (ref 13.0–17.0)
MCH: 31 pg (ref 26.0–34.0)
MCHC: 33.8 g/dL (ref 30.0–36.0)
MCV: 91.6 fL (ref 78.0–100.0)
Platelets: 104 10*3/uL — ABNORMAL LOW (ref 150–400)
RBC: 3.23 MIL/uL — ABNORMAL LOW (ref 4.22–5.81)
RDW: 15.2 % (ref 11.5–15.5)
WBC: 4.1 10*3/uL (ref 4.0–10.5)

## 2017-05-29 LAB — COMPREHENSIVE METABOLIC PANEL
ALBUMIN: 2.8 g/dL — AB (ref 3.5–5.0)
ALT: 19 U/L (ref 17–63)
ANION GAP: 7 (ref 5–15)
AST: 22 U/L (ref 15–41)
Alkaline Phosphatase: 56 U/L (ref 38–126)
BILIRUBIN TOTAL: 0.8 mg/dL (ref 0.3–1.2)
BUN: 5 mg/dL — ABNORMAL LOW (ref 6–20)
CHLORIDE: 103 mmol/L (ref 101–111)
CO2: 28 mmol/L (ref 22–32)
Calcium: 8.1 mg/dL — ABNORMAL LOW (ref 8.9–10.3)
Creatinine, Ser: 0.69 mg/dL (ref 0.61–1.24)
GFR calc Af Amer: >60 mL/min (ref >60)
GLUCOSE: 117 mg/dL — AB (ref 65–99)
POTASSIUM: 3.5 mmol/L (ref 3.5–5.1)
Sodium: 138 mmol/L (ref 135–145)
TOTAL PROTEIN: 4.8 g/dL — AB (ref 6.5–8.1)

## 2017-05-29 LAB — PREPARE RBC (CROSSMATCH)

## 2017-05-29 LAB — PROTIME-INR
INR: 1.1
PROTHROMBIN TIME: 14.1 s (ref 11.4–15.2)

## 2017-05-29 LAB — APTT: APTT: 33 s (ref 24–36)

## 2017-05-29 LAB — ABO/RH: ABO/RH(D): O POS

## 2017-05-29 SURGERY — INSERTION, INTRAMEDULLARY ROD, TIBIA
Anesthesia: General | Laterality: Right

## 2017-05-29 MED ORDER — CEFAZOLIN SODIUM-DEXTROSE 2-3 GM-%(50ML) IV SOLR
INTRAVENOUS | Status: DC | PRN
  Administered 2017-05-29: 2 g via INTRAVENOUS

## 2017-05-29 MED ORDER — ONDANSETRON HCL 4 MG/2ML IJ SOLN: 4.0000 mg | Freq: Once | INTRAMUSCULAR | Status: DC | PRN

## 2017-05-29 MED ORDER — FENTANYL CITRATE (PF) 100 MCG/2ML IJ SOLN: 25.0000 ug | INTRAMUSCULAR | Status: DC | PRN

## 2017-05-29 MED ORDER — SUGAMMADEX SODIUM 200 MG/2ML IV SOLN
INTRAVENOUS | Status: DC | PRN
  Administered 2017-05-29: 200 mg via INTRAVENOUS

## 2017-05-29 MED ORDER — ALBUMIN HUMAN 5 % IV SOLN
INTRAVENOUS | Status: DC | PRN
  Administered 2017-05-29 (×2): via INTRAVENOUS

## 2017-05-29 MED ORDER — VANCOMYCIN HCL 1000 MG IV SOLR
INTRAVENOUS | Status: AC
  Filled 2017-05-29: qty 1000

## 2017-05-29 MED ORDER — PHENYLEPHRINE HCL 10 MG/ML IJ SOLN
INTRAMUSCULAR | Status: DC | PRN
  Administered 2017-05-29: 50 ug/min via INTRAVENOUS

## 2017-05-29 MED ORDER — ARTIFICIAL TEARS OPHTHALMIC OINT
TOPICAL_OINTMENT | OPHTHALMIC | Status: AC
  Filled 2017-05-29: qty 3.5

## 2017-05-29 MED ORDER — ACETAMINOPHEN 10 MG/ML IV SOLN
1000.0000 mg | Freq: Once | INTRAVENOUS | Status: AC
  Administered 2017-05-29: 1000 mg via INTRAVENOUS

## 2017-05-29 MED ORDER — HYDROMORPHONE HCL 1 MG/ML IJ SOLN
0.2500 mg | INTRAMUSCULAR | Status: DC | PRN
  Administered 2017-05-29 (×4): 0.5 mg via INTRAVENOUS

## 2017-05-29 MED ORDER — MIDAZOLAM HCL 2 MG/2ML IJ SOLN
INTRAMUSCULAR | Status: AC
  Filled 2017-05-29: qty 2

## 2017-05-29 MED ORDER — PROPOFOL 10 MG/ML IV BOLUS
INTRAVENOUS | Status: DC | PRN
  Administered 2017-05-29: 200 mg via INTRAVENOUS

## 2017-05-29 MED ORDER — MIDAZOLAM HCL 5 MG/5ML IJ SOLN
INTRAMUSCULAR | Status: DC | PRN
  Administered 2017-05-29 (×2): 1 mg via INTRAVENOUS

## 2017-05-29 MED ORDER — LIDOCAINE 2% (20 MG/ML) 5 ML SYRINGE
INTRAMUSCULAR | Status: AC
  Filled 2017-05-29: qty 5

## 2017-05-29 MED ORDER — LACTATED RINGERS IV SOLN
INTRAVENOUS | Status: DC | PRN
Start: 2017-05-29 — End: 2017-05-29
  Administered 2017-05-29 (×2): via INTRAVENOUS

## 2017-05-29 MED ORDER — 0.9 % SODIUM CHLORIDE (POUR BTL) OPTIME
TOPICAL | Status: DC | PRN
  Administered 2017-05-29: 1000 mL

## 2017-05-29 MED ORDER — PHENYLEPHRINE HCL 10 MG/ML IJ SOLN
INTRAMUSCULAR | Status: DC | PRN
  Administered 2017-05-29: 80 ug via INTRAVENOUS
  Administered 2017-05-29: 120 ug via INTRAVENOUS

## 2017-05-29 MED ORDER — TOBRAMYCIN SULFATE 1.2 G IJ SOLR
INTRAMUSCULAR | Status: AC
  Filled 2017-05-29: qty 1.2

## 2017-05-29 MED ORDER — OXYCODONE HCL 5 MG PO TABS: 5.0000 mg | ORAL_TABLET | Freq: Once | ORAL | Status: DC | PRN

## 2017-05-29 MED ORDER — LIDOCAINE 2% (20 MG/ML) 5 ML SYRINGE
INTRAMUSCULAR | Status: DC | PRN
  Administered 2017-05-29: 60 mg via INTRAVENOUS

## 2017-05-29 MED ORDER — OXYCODONE HCL 5 MG PO TABS
ORAL_TABLET | ORAL | Status: AC
  Filled 2017-05-29: qty 1

## 2017-05-29 MED ORDER — HYDROMORPHONE HCL 1 MG/ML IJ SOLN
INTRAMUSCULAR | Status: AC
  Administered 2017-05-29: 0.5 mg via INTRAVENOUS
  Filled 2017-05-29: qty 1

## 2017-05-29 MED ORDER — FENTANYL CITRATE (PF) 100 MCG/2ML IJ SOLN
INTRAMUSCULAR | Status: DC | PRN
  Administered 2017-05-29: 50 ug via INTRAVENOUS
  Administered 2017-05-29: 100 ug via INTRAVENOUS
  Administered 2017-05-29: 150 ug via INTRAVENOUS

## 2017-05-29 MED ORDER — SODIUM CHLORIDE 0.9 % IV SOLN
INTRAVENOUS | Status: DC | PRN
  Administered 2017-05-29: 09:00:00 via INTRAVENOUS

## 2017-05-29 MED ORDER — CEFAZOLIN SODIUM-DEXTROSE 2-4 GM/100ML-% IV SOLN: 2.0000 g | Freq: Three times a day (TID) | INTRAVENOUS | Status: DC

## 2017-05-29 MED ORDER — PROPOFOL 10 MG/ML IV BOLUS
INTRAVENOUS | Status: AC
  Filled 2017-05-29: qty 20

## 2017-05-29 MED ORDER — ALBUTEROL SULFATE HFA 108 (90 BASE) MCG/ACT IN AERS
INHALATION_SPRAY | RESPIRATORY_TRACT | Status: DC | PRN
  Administered 2017-05-29 (×2): 2 via RESPIRATORY_TRACT

## 2017-05-29 MED ORDER — OXYCODONE HCL 5 MG/5ML PO SOLN: 5.0000 mg | Freq: Once | ORAL | Status: DC | PRN

## 2017-05-29 MED ORDER — FENTANYL CITRATE (PF) 250 MCG/5ML IJ SOLN
INTRAMUSCULAR | Status: AC
  Filled 2017-05-29: qty 5

## 2017-05-29 MED ORDER — ONDANSETRON HCL 4 MG/2ML IJ SOLN
INTRAMUSCULAR | Status: AC
  Filled 2017-05-29: qty 2

## 2017-05-29 MED ORDER — GLYCOPYRROLATE 0.2 MG/ML IJ SOLN
INTRAMUSCULAR | Status: DC | PRN
  Administered 2017-05-29: 0.1 mg via INTRAVENOUS

## 2017-05-29 MED ORDER — SODIUM CHLORIDE 0.9 % IV SOLN: Freq: Once | INTRAVENOUS | Status: DC

## 2017-05-29 MED ORDER — OXYCODONE HCL 5 MG/5ML PO SOLN: 5.0000 mg | Freq: Once | ORAL | Status: AC | PRN

## 2017-05-29 MED ORDER — OXYCODONE HCL 5 MG PO TABS
5.0000 mg | ORAL_TABLET | Freq: Once | ORAL | Status: AC | PRN
  Administered 2017-05-29: 5 mg via ORAL

## 2017-05-29 MED ORDER — BACITRACIN ZINC 500 UNIT/GM EX OINT
TOPICAL_OINTMENT | CUTANEOUS | Status: AC
  Filled 2017-05-29: qty 28.35

## 2017-05-29 MED ORDER — PHENYLEPHRINE 40 MCG/ML (10ML) SYRINGE FOR IV PUSH (FOR BLOOD PRESSURE SUPPORT)
PREFILLED_SYRINGE | INTRAVENOUS | Status: AC
  Filled 2017-05-29: qty 10

## 2017-05-29 MED ORDER — ONDANSETRON HCL 4 MG/2ML IJ SOLN
INTRAMUSCULAR | Status: DC | PRN
  Administered 2017-05-29: 4 mg via INTRAVENOUS

## 2017-05-29 MED ORDER — ROCURONIUM BROMIDE 10 MG/ML (PF) SYRINGE
PREFILLED_SYRINGE | INTRAVENOUS | Status: AC
  Filled 2017-05-29: qty 5

## 2017-05-29 MED ORDER — ACETAMINOPHEN 10 MG/ML IV SOLN
INTRAVENOUS | Status: AC
  Administered 2017-05-29: 1000 mg via INTRAVENOUS
  Filled 2017-05-29: qty 100

## 2017-05-29 MED ORDER — VANCOMYCIN HCL 1000 MG IV SOLR
INTRAVENOUS | Status: DC | PRN
  Administered 2017-05-29: 1000 mg via TOPICAL

## 2017-05-29 MED ORDER — ROCURONIUM BROMIDE 100 MG/10ML IV SOLN
INTRAVENOUS | Status: DC | PRN
  Administered 2017-05-29 (×2): 50 mg via INTRAVENOUS

## 2017-05-29 SURGICAL SUPPLY — 96 items
BANDAGE ACE 4X5 VEL STRL LF (GAUZE/BANDAGES/DRESSINGS) ×8 IMPLANT
BANDAGE ACE 6X5 VEL STRL LF (GAUZE/BANDAGES/DRESSINGS) ×12 IMPLANT
BANDAGE ESMARK 6X9 LF (GAUZE/BANDAGES/DRESSINGS) ×2 IMPLANT
BIT DRILL 100X2.5XANTM LCK (BIT) ×2 IMPLANT
BIT DRILL 3.8X6 NS (BIT) ×8 IMPLANT
BIT DRILL 4.4 NS (BIT) ×4 IMPLANT
BIT DRILL CAL (BIT) ×2 IMPLANT
BIT DRILL SLEEVE MEASURING (BIT) ×2 IMPLANT
BIT DRL 100X2.5XANTM LCK (BIT) ×2
BLADE CLIPPER SURG (BLADE) IMPLANT
BLADE SURG 10 STRL SS (BLADE) ×8 IMPLANT
BLADE SURG 15 STRL LF DISP TIS (BLADE) ×2 IMPLANT
BLADE SURG 15 STRL SS (BLADE) ×2
BNDG COHESIVE 4X5 TAN STRL (GAUZE/BANDAGES/DRESSINGS) ×4 IMPLANT
BNDG ESMARK 6X9 LF (GAUZE/BANDAGES/DRESSINGS) ×4
BNDG GAUZE ELAST 4 BULKY (GAUZE/BANDAGES/DRESSINGS) IMPLANT
BRUSH SCRUB SURG 4.25 DISP (MISCELLANEOUS) ×8 IMPLANT
CANISTER SUCT 3000ML PPV (MISCELLANEOUS) ×4 IMPLANT
CLOSURE WOUND 1/2 X4 (GAUZE/BANDAGES/DRESSINGS)
COVER SURGICAL LIGHT HANDLE (MISCELLANEOUS) ×8 IMPLANT
CUFF TOURNIQUET SINGLE 34IN LL (TOURNIQUET CUFF) ×4 IMPLANT
DRAPE C-ARM 42X72 X-RAY (DRAPES) ×4 IMPLANT
DRAPE C-ARMOR (DRAPES) ×8 IMPLANT
DRAPE EXTREMITY BILATERAL (DRAPES) ×4 IMPLANT
DRAPE HALF SHEET 40X57 (DRAPES) ×8 IMPLANT
DRAPE INCISE IOBAN 66X45 STRL (DRAPES) ×8 IMPLANT
DRAPE U-SHAPE 47X51 STRL (DRAPES) ×8 IMPLANT
DRILL BIT 2.5MM (BIT) ×2
DRILL BIT CAL (BIT) ×4
DRILL SLEEVE MEASURING (BIT) ×4
DRSG ADAPTIC 3X8 NADH LF (GAUZE/BANDAGES/DRESSINGS) ×8 IMPLANT
DRSG PAD ABDOMINAL 8X10 ST (GAUZE/BANDAGES/DRESSINGS) ×12 IMPLANT
ELECT REM PT RETURN 9FT ADLT (ELECTROSURGICAL) ×4
ELECTRODE REM PT RTRN 9FT ADLT (ELECTROSURGICAL) ×2 IMPLANT
GAUZE SPONGE 4X4 12PLY STRL (GAUZE/BANDAGES/DRESSINGS) ×8 IMPLANT
GLOVE BIO SURGEON STRL SZ7.5 (GLOVE) ×8 IMPLANT
GLOVE BIO SURGEON STRL SZ8 (GLOVE) ×8 IMPLANT
GLOVE BIO SURGEON STRL SZ8.5 (GLOVE) ×8 IMPLANT
GLOVE BIOGEL PI IND STRL 7.5 (GLOVE) ×4 IMPLANT
GLOVE BIOGEL PI IND STRL 8 (GLOVE) ×4 IMPLANT
GLOVE BIOGEL PI INDICATOR 7.5 (GLOVE) ×4
GLOVE BIOGEL PI INDICATOR 8 (GLOVE) ×4
GOWN STRL REUS W/ TWL LRG LVL3 (GOWN DISPOSABLE) ×4 IMPLANT
GOWN STRL REUS W/ TWL XL LVL3 (GOWN DISPOSABLE) ×4 IMPLANT
GOWN STRL REUS W/TWL LRG LVL3 (GOWN DISPOSABLE) ×4
GOWN STRL REUS W/TWL XL LVL3 (GOWN DISPOSABLE) ×4
GUIDEPIN 3.2X17.5 THRD DISP (PIN) ×4 IMPLANT
GUIDEWIRE BALL NOSE 80CM (WIRE) ×4 IMPLANT
IMMOBILIZER KNEE 22 UNIV (SOFTGOODS) ×4 IMPLANT
K-WIRE ACE 1.6X6 (WIRE) ×8
KIT BASIN OR (CUSTOM PROCEDURE TRAY) ×4 IMPLANT
KIT ROOM TURNOVER OR (KITS) ×4 IMPLANT
KWIRE ACE 1.6X6 (WIRE) ×4 IMPLANT
NAIL TIBIAL 11MMX33CM (Nail) ×4 IMPLANT
NDL SUT 6 .5 CRC .975X.05 MAYO (NEEDLE) IMPLANT
NEEDLE MAYO TAPER (NEEDLE)
NS IRRIG 1000ML POUR BTL (IV SOLUTION) ×4 IMPLANT
PACK ORTHO EXTREMITY (CUSTOM PROCEDURE TRAY) ×4 IMPLANT
PAD ARMBOARD 7.5X6 YLW CONV (MISCELLANEOUS) ×8 IMPLANT
PAD CAST 4YDX4 CTTN HI CHSV (CAST SUPPLIES) IMPLANT
PADDING CAST COTTON 4X4 STRL (CAST SUPPLIES)
PADDING CAST COTTON 6X4 STRL (CAST SUPPLIES) ×16 IMPLANT
PLATE LOCK 3H STD LT PROX TIB (Plate) ×4 IMPLANT
SCREW ACECAP 34MM (Screw) ×4 IMPLANT
SCREW ACECAP 42MM (Screw) ×4 IMPLANT
SCREW ACECAP 44MM (Screw) ×4 IMPLANT
SCREW LOCK CORT STAR 3.5X65 (Screw) ×4 IMPLANT
SCREW LOCK CORT STAR 3.5X75 (Screw) ×8 IMPLANT
SCREW LP 3.5X70MM (Screw) ×4 IMPLANT
SCREW LP 3.5X75MM (Screw) ×4 IMPLANT
SCREW PROXIMAL DEPUY (Screw) ×4 IMPLANT
SCREW PRXML FT 45X5.5XLCK NS (Screw) ×4 IMPLANT
SPLINT FIBERGLASS 4X30 (CAST SUPPLIES) ×4 IMPLANT
SPONGE LAP 18X18 X RAY DECT (DISPOSABLE) ×4 IMPLANT
STAPLER VISISTAT 35W (STAPLE) ×4 IMPLANT
STOCKINETTE IMPERVIOUS LG (DRAPES) ×4 IMPLANT
STRIP CLOSURE SKIN 1/2X4 (GAUZE/BANDAGES/DRESSINGS) IMPLANT
SUCTION FRAZIER HANDLE 10FR (MISCELLANEOUS) ×2
SUCTION TUBE FRAZIER 10FR DISP (MISCELLANEOUS) ×2 IMPLANT
SUT ETHILON 3 0 PS 1 (SUTURE) ×4 IMPLANT
SUT PROLENE 0 CT 2 (SUTURE) ×8 IMPLANT
SUT VIC AB 0 CT1 18XCR BRD 8 (SUTURE) ×2 IMPLANT
SUT VIC AB 0 CT1 27 (SUTURE) ×4
SUT VIC AB 0 CT1 27XBRD ANBCTR (SUTURE) ×4 IMPLANT
SUT VIC AB 0 CT1 8-18 (SUTURE) ×2
SUT VIC AB 1 CT1 27 (SUTURE) ×2
SUT VIC AB 1 CT1 27XBRD ANBCTR (SUTURE) ×2 IMPLANT
SUT VIC AB 2-0 CT1 27 (SUTURE) ×4
SUT VIC AB 2-0 CT1 TAPERPNT 27 (SUTURE) ×4 IMPLANT
TOWEL OR 17X24 6PK STRL BLUE (TOWEL DISPOSABLE) ×4 IMPLANT
TOWEL OR 17X26 10 PK STRL BLUE (TOWEL DISPOSABLE) ×8 IMPLANT
TRAY FOLEY W/METER SILVER 16FR (SET/KITS/TRAYS/PACK) ×4 IMPLANT
TUBE CONNECTING 12'X1/4 (SUCTIONS) ×1
TUBE CONNECTING 12X1/4 (SUCTIONS) ×3 IMPLANT
WATER STERILE IRR 1000ML POUR (IV SOLUTION) ×8 IMPLANT
YANKAUER SUCT BULB TIP NO VENT (SUCTIONS) ×8 IMPLANT

## 2017-05-29 NOTE — Op Note (Signed)
OrthopaedicSurgeryOperativeNote (CNO:[id number]) Date of Surgery: 05/29/2017  Admit Date: 05/27/2017   Diagnoses: Pre-Op Diagnoses: Left tibial plateau fracture Right tibial shaft fracture Right Calcaneus fracture  Post-Op Diagnosis: Same  Procedures: 1. CPT 27759-Intramedullary nailing of right tibial shaft fracture 2. CPT 27535-ORIF of left lateral tibial plateau fracture   Surgeons: Primary: Shona Needles, MD   Location:[facility] OR ROOM 03   AnesthesiaGeneral   Antibiotics:Ancef 2g preop   Tourniquettime: Total Tourniquet Time Documented: Thigh (Left) - 45 minutes Total: Thigh (Left) - 45 minutes   [id number]:275 mL   Complications: None  Specimens: None  Implants:  Implant Name Type Inv. Item Serial No. Manufacturer Lot No. LRB No. Used Action  NAIL TIBIAL [id number] - [id number] Nail NAIL TIBIAL [id number]  ZIMMER CAROLINAS [id number] Right 1 Implanted  SCREW ACECAP 34MM - [id number] Screw SCREW ACECAP 34MM  ZIMMER CAROLINAS  Right 1 Implanted  SCREW ACECAP 42MM - [id number] Screw SCREW ACECAP 42MM  ZIMMER CAROLINAS  Right 1 Implanted  SCREW ACECAP 44MM - [id number] Screw SCREW ACECAP 44MM  ZIMMER CAROLINAS  Right 1 Implanted  SCREW PROXIMAL DEPUY - [id number] Screw SCREW PROXIMAL DEPUY  ZIMMER CAROLINAS  Right 2 Implanted  PLATE TIBIA PROXIAMAL 3HOLE LT - [id number] Plate PLATE TIBIA PROXIAMAL 3HOLE LT  ZIMMER CAROLINAS  Left 1 Implanted  SCREW LP 3.5X70MM - [id number] Screw SCREW LP 3.5X70MM  ZIMMER CAROLINAS  Left 1 Implanted  SCREW LP 3.5X75MM - [id number] Screw SCREW LP 3.5X75MM  ZIMMER CAROLINAS  Left 1 Implanted  SCREW CORTICAL LOCK 3.5X75 - [id number] Screw SCREW CORTICAL LOCK 3.5X75   ZIMMER CAROLINAS   Left 2 Implanted    IndicationsforSurgery: Wayne Grimes is a 31 year old male who 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  He was up on a ladder and fell approximately 12 feet.  He sustained a right closed tibial shaft  fracture, a right intra-articular calcaneus fracture and a left lateral tibial plateau fracture he also had a history of pneumonia that was treated with oral antibiotics.  Our team tried to fix him yesterday unfortunately he had very low saturations and significant tachycardia and anesthesia felt that his surgery should be delayed for further workup and management by the medical team.  He underwent a CT  scan of his chest which showed no pulmonary embolism.  He was started on IV antibiotics for his pneumonias and he improved overnight to be able to undergo fixation of his bilateral lower extremities.  We discussed risks and benefits to proceeding with fixation.  We felt that fixation of his tibial shaft fracture as well as his left tibial plateau fracture would be most appropriate with potential ORIF of his right calcaneus depending on the swelling of his leg.  Risks discussed included bleeding requiring blood transfusion, bleeding causing a hematoma, infection, malunion, nonunion, damage to surrounding nerves and blood vessels, pain, hardware prominence or irritation, hardware failure, stiffness, post-traumatic arthritis, DVT/PE, compartment syndrome, and even death. The patient and their family agreed to proceed with surgery and consent was obtained.  Operative Findings: 1. Right tibial shaft fracture treated with intramedullary nail Zimmer-Biomet Versa-Nail 330x47mm nail with 2 distal interlocks and 2 proximal interlocks 2. Calcaneus was too swollen to perform ORIF at this setting  3. Greater than 10 degrees of valgus instability with stress of left tibial plateau treated with a 3 hole proximal tibial locking plate as noted above  Procedure: The patient was identified in the preoperative holding area. Consent was confirmed with the patient  and their family and all questions were answered. The operative extremity was marked after confirmation with the patient. he was then brought back to the operating  room by our anesthesia colleagues.  He was placed under general anesthetic and then carefully transferred over to a radiolucent flat top table.  Here a bump was placed under his right hip.  A Foley catheter was placed as well.  Bilateral lower extremities were then prepped and draped in usual sterile fashion. A preoperative timeout was performed to verify the patient, the procedure, and the extremity. Preoperative antibiotics were dosed.  A bone foam was then placed under the drapes of the right side.  Fluoroscopic imaging was obtained to show the displacement of the fracture.   I made a lateral parapatellar incision carried down through skin and subcutaneous tissue just lateral to the patellar tendon.  I released a portion of the lateral retinaculum but stayed extra-articular outside the capsule.  I excised the anterior fat pad and then proceeded to place a guidepin under fluoroscopic imaging to confirm adequate placement in both the AP and lateral views.  I then advanced a wire into the proximal metaphysis of the tibia.  I then used an entry reamer to enter the canal.  I passed a bent ball-tipped guidewire down the center of the canal and was able to pass in the distal segment.  I seated it down into the physeal scar.  I then measured the length of the nail and I chose a 330 mm nail.  I then proceeded to sequentially ream up from 8.5 mm to 12 mm.  I obtained excellent chatter and I chose to place an 11 mm nail.  I then placed nail across the fracture into the distal segment.  The fracture had excellent reduction after the nail was placed.  I seated the nail to where it was slightly buried at the lateral of the knee.  I then placed 2 distal interlocking screws from medial to lateral using perfect circle technique.  I back slapped the nail to provide some compression at the fracture site.  I then used my proximal jig to place 2 proximal interlocking screws through percutaneous incisions.  I removed the jig  and obtained final fluoroscopic imaging.  The incisions were copiously irrigated.  I closed the lateral parapatellar incision with 0 Vicryl, 2-0 Vicryl and 3-0 nylon.  The remainder the incisions were closed with 3-0 nylon.  I then turned my attention to the left lower extremity.  Under fluoroscopic stress examination of the knee I felt that there was enough valgus instability with just over 10 degrees almost 15 degrees of valgus movement compared to the contralateral side that I felt that stabilization of the plateau fracture would be most appropriate.  It would allow me to weight-bear him a little bit sooner and provide a stable knee as he would likely be nearly 3 months nonweightbearing with his calcaneus on the contralateral side.  An esmarch was used to exsanguinate the leg and it was inflated to 324mmHg. An anterolateral parapatellar incision was performed and carried through skin and subcutaneous tissue. The overlying fascia was incised in line with the skin incision just lateral to the patellar tendon. It was extended distally into the anterior compartment fascia. Bovie electrocautery was then used to elevated the IT band and musculature off of the anterolateral cortex of the tibia. The release was taken back until the fibular head was palpable. The plane between the IT band and the lateral capsule  was developed and the anterior fat pad was resected to expose the capsule.  A submensical arthrotomy was then performed with a 15 blade. Tag sutures were used to retract the capsule and here I was able to visualize the impacted lateral joint and was able to see the meniscus. There was no meniscus tear visualized.  There was not significant depression of the lateral joint and I felt that no elevation of the joint was needed as I could visualize the articular surface and it appeared well reduced I felt that reduction of the lateralization of the condyle would provide the stability that was needed in his  knee.   A 3 hole proximal tibial locking plate was chosen and was aligned to the tibia in both the AP and lateral fluoroscopic views. A peri-articular reduction clamp was then used to compress the plate to the lateral plateau. A non-locking buttress screw was placed in the proximal segment to bring the plate flush with bone.  Another nonlocking screw was placed at the anterior most plate hole in the proximal row to bring the plate close to the lateral cortex. Three locking screws were then drilled and placed crossing both the lateral and medial plateau, thereby providing fixation of the lateral side.  A screw was placed in the last hole of the plate to fix it to the shaft.  The tourniquet was dropped and hemostasis was obtained. The incision was thoroughly irrigated. The tag sutures for the capsule were brought through the plate and tied down and the meniscus repair sutures were brought through the IT band. One gram of vancomycin powder was placed in the wound and the IT band and anterior tibialis fascia was closed with 0-vicryl suture. The skin was closed with 2-0 vicryl and 3-0 nylon. The percutaneous incisions were closed with 3-0 nylon suture. The remainder of the incisions were dressed with bacitracin ointment, adaptic and 4x4s. The knee and leg were dressed with sterile cast padding and an ACE wrap.   I then returned to the right leg and dressed the incisions with bacitracin ointment, Adaptic, 4 x 4's and sterile cast padding.  I put a compressive Ace wrap around the calcaneus.  I then placed a well-padded short leg splint. The patient was then awoken from anesthesia and taken to PACU in stable condition.  Post Op Plan/Instructions: The patient will be nonweightbearing to the bilateral lower extremities.  He will elevate his right lower extremity to improve the swelling and hopefully I will be able to perform ORIF of his calcaneus early next week.  He will receive postoperative Ancef.  He will  receive Lovenox for DVT prophylaxis.  We will continue with the IV antibiotics for his pneumonia.  I was present and performed the entire surgery.  Katha Hamming, MD [facility]

## 2017-05-29 NOTE — Anesthesia Postprocedure Evaluation (Signed)
Anesthesia Post Note  Patient: Wayne Grimes  Procedure(s) Performed: RIGHT TIBIA INTRAMEDULLARY (IM) NAIL (Right ) OPEN REDUCTION INTERNAL FIXATION (ORIF) TIBIAL PLATEAU (Left )     Patient location during evaluation: PACU Anesthesia Type: General Level of consciousness: awake, awake and alert and oriented Pain management: pain level controlled Vital Signs Assessment: post-procedure vital signs reviewed and stable Respiratory status: spontaneous breathing, nonlabored ventilation and respiratory function stable Cardiovascular status: blood pressure returned to baseline Anesthetic complications: no    Last Vitals:  Vitals:   05/29/17 1315 05/29/17 1604  BP: 110/68 110/67  Pulse: (!) 124 (!) 106  Resp: 16   Temp: 36.7 C 36.7 C  SpO2: 95% 96%    Last Pain:  Vitals:   05/29/17 1604  TempSrc: Oral  PainSc:                  Alver Leete COKER

## 2017-05-29 NOTE — OR Nursing (Signed)
Pt continued to have pain despite 2mg  Dilaudid IV and IV Tylenol.  Dr. Linna Caprice notified.  Dr. Linna Caprice at bedside and administered precedex bolus for adjunct pain management.  Pt resting comfortably.

## 2017-05-29 NOTE — Anesthesia Procedure Notes (Signed)
Procedure Name: Intubation Date/Time: 05/29/2017 7:40 AM Performed by: Linna Caprice, DAVID Pre-anesthesia Checklist: Patient identified, Emergency Drugs available, Suction available and Patient being monitored Patient Re-evaluated:Patient Re-evaluated prior to induction Oxygen Delivery Method: Circle system utilized Preoxygenation: Pre-oxygenation with 100% oxygen Induction Type: IV induction Ventilation: Mask ventilation without difficulty Laryngoscope Size: Mac and 3 Grade View: Grade I Tube type: Subglottic suction tube Tube size: 8.0 mm Number of attempts: 1 Airway Equipment and Method: Stylet Placement Confirmation: ETT inserted through vocal cords under direct vision,  positive ETCO2 and breath sounds checked- equal and bilateral Secured at: 22 cm Tube secured with: Tape Dental Injury: Teeth and Oropharynx as per pre-operative assessment

## 2017-05-29 NOTE — Anesthesia Procedure Notes (Signed)
Arterial Line Insertion Start/End10/26/2018 7:50 AM, 05/29/2017 8:02 AM Performed by: Carney Living, CRNA  Patient location: OR. Preanesthetic checklist: patient identified, IV checked, monitors and equipment checked, pre-op evaluation and timeout performed Lidocaine 1% used for infiltration Left, radial was placed Catheter size: 20 G Hand hygiene performed  and maximum sterile barriers used   Attempts: 3 Procedure performed without using ultrasound guided technique. Following insertion, dressing applied and Biopatch. Post procedure assessment: normal and unchanged  Patient tolerated the procedure well with no immediate complications.

## 2017-05-29 NOTE — Progress Notes (Signed)
D/w Dr. Florene Glen of [facility] oncology Blycinto is a 5 week infusion--can wait til d/c 1-2 weeks after all operations Will continue the Dasatinib for now  [facility]  Verneita Griffes, MD [facility] [phone]

## 2017-05-29 NOTE — Progress Notes (Signed)
PROGRESS NOTE    Wayne Grimes  ZOX:[id number] DOB: 03-06-1986 DOA: 05/27/2017 PCP: Patient, No Pcp Per   Specialists:   Oncology Dr. 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 Left upper extremity DVT 06/2012, right IJ DVT 08/17/2012 History of rectal and peritoneal abscess 01/10/2016 status post surgery Prior subdural hematoma  Status post fall right tibia shaft fracture + left tibial plateau fracture with right calcaneal fracture  recent diagnosis of pneumonia--seen and evaluated initially by trauma surgery  Found to have low O2 sats, CT Angie was done because of prior DVT history Found to have multilobar pneumonia Consulted by trauma/orthopedics regarding concurrent management of pneumonia/AL   Assessment & Plan:   Principal Problem:   Accidental fall from ladder Active Problems:   Displaced comminuted fracture of shaft of right tibia, initial encounter for closed fracture   Displaced fracture of body of right calcaneus, initial encounter for closed fracture   Closed fracture of lateral portion of left tibial plateau   History of DVT (deep vein thrombosis)   Seems hemodynamically stable- --defer management of pain, wound care, disposition to orthopedics and postop therapy in terms of weightbearing status etc.  History of ALL--currently managed at [facility] unremarkable with WBC 5.3 platelet 127 hemoglobin 9.7  Differential does not show any concerns  His Dasatinib can be continued cautiously from my perspective however if he is to get further chemotherapy final cycle 5 of Blycinto as per last OV 10/16 note scheduled 10/30, will hold off for now of the same has predisposes to infections, cytokine release syndrome and multiple other issues--have called to d/w Oncologist at [facility] and will update   Recent diagnosis pneumonia about 10-14 days prior placed on  Levaquin Acute hypoxic respiratory failure initially needing 3 L O2  Agree with Zosyn for multi lobar pneumonia--Would narrow in 24-48 hours back to po levaquin--duration would be ~ 10  Days--given his h/o Cancer woul ddefer but recommne dPET or other scanning on return to Oncology clinic  Pulmonary toilet, flutter valve, incentive spirometer early postoperatively--nursin aware  Does not necessarily need further x-ray of chest unless clinically indicated  We will follow and continue to assist post-op     Lovenox Full code Inpatient Pending surgery and orthopedic input  Consultants:   Consulted by orthopedics  Procedures:   Various  Antimicrobials:   IV Zosyn   Subjective:   Interviewed with b-I-l.  No distress  No sob No sputum No cp Pain controlled  Objective: Vitals:   05/28/17 0940 05/28/17 1030 05/28/17 2100 05/29/17 0600  BP: 125/82 129/86 127/82 121/72  Pulse: (!) 118 (!) 116 (!) 112 (!) 124  Resp:  18 18   Temp: 99 F (37.2 C) 98.7 F (37.1 C) 100.2 F (37.9 C) 98 F (36.7 C)  TempSrc: Oral Oral Oral Oral  SpO2: 96% 96% 96% 96%  Weight:      Height:        Intake/Output Summary (Last 24 hours) at 05/29/17 0733 Last data filed at 05/29/17 0300  Gross per 24 hour  Intake              535 ml  Output             1680 ml  Net            -1145 ml   Filed Weights   05/27/17 1513  Weight: 101.6 kg (  224 lb)    Examination:  eomi ncat cta b Thick neck No pallor no ict cta b abd soft nt nd no rebound LE wrapped bilaterally nuero intact  Data Reviewed: I have personally reviewed following labs and imaging studies  CBC:  Recent Labs Lab 05/27/17 1959 05/27/17 2240 05/29/17 0429  WBC 5.5 5.5 5.3  NEUTROABS 4.1  --  3.2  HGB 10.9* 10.6* 9.7*  HCT 33.2* 32.7* 30.4*  MCV 93.0 92.6 93.5  PLT 185 180 098*   Basic Metabolic Panel:  Recent Labs Lab 05/27/17 1959 05/27/17 2240 05/29/17 0429  NA 140  --  138  K 3.3*  --  3.5  CL  108  --  103  CO2 24  --  28  GLUCOSE 161*  --  117*  BUN 12  --  5*  CREATININE 0.75 0.69 0.69  CALCIUM 8.2*  --  8.1*   GFR: Estimated Creatinine Clearance: 152 mL/min (by C-G formula based on SCr of 0.69 mg/dL). Liver Function Tests:  Recent Labs Lab 05/29/17 0429  AST 22  ALT 19  ALKPHOS 56  BILITOT 0.8  PROT 4.8*  ALBUMIN 2.8*   No results for input(s): LIPASE, AMYLASE in the last 168 hours. No results for input(s): AMMONIA in the last 168 hours. Coagulation Profile:  Recent Labs Lab 05/29/17 0429  INR 1.10   Cardiac Enzymes: No results for input(s): CKTOTAL, CKMB, CKMBINDEX, TROPONINI in the last 168 hours. BNP (last 3 results) No results for input(s): PROBNP in the last 8760 hours. HbA1C: No results for input(s): HGBA1C in the last 72 hours. CBG: No results for input(s): GLUCAP in the last 168 hours. Lipid Profile: No results for input(s): CHOL, HDL, LDLCALC, TRIG, CHOLHDL, LDLDIRECT in the last 72 hours. Thyroid Function Tests: No results for input(s): TSH, T4TOTAL, FREET4, T3FREE, THYROIDAB in the last 72 hours. Anemia Panel: No results for input(s): VITAMINB12, FOLATE, FERRITIN, TIBC, IRON, RETICCTPCT in the last 72 hours. Urine analysis: No results found for: COLORURINE, APPEARANCEUR, LABSPEC, [place], GLUCOSEU, [place], [place], [employer], [place], [place], NITRITE, [place]   Radiology Studies: Reviewed images personally in health database    Scheduled Meds: . [MAR Hold] albuterol  2.5 mg Nebulization Once  . [MAR Hold] dasatinib  70 mg Oral QHS  . [MAR Hold] docusate sodium  100 mg Oral BID  . [MAR Hold] enoxaparin (LOVENOX) injection  40 mg Subcutaneous Q24H  . [MAR Hold] ketorolac  15 mg Intravenous Q8H  . [MAR Hold] methocarbamol  1,000 mg Oral QID   Continuous Infusions: . 0.9 % NaCl with KCl 20 mEq / L 150 mL/hr at 05/28/17 2111  . [MAR Hold]  ceFAZolin (ANCEF) IV    . [MAR Hold] methocarbamol (ROBAXIN)  IV    . [MAR  Hold] piperacillin-tazobactam (ZOSYN)  IV Stopped (05/29/17 0111)     LOS: 2 days    Time spent: [place], MD [facility]   If 7PM-7AM, please contact night-coverage [email or web address] Password [facility] 05/29/2017, 7:33 AM

## 2017-05-29 NOTE — Transfer of Care (Signed)
Immediate Anesthesia Transfer of Care Note  Patient: Wayne Grimes  Procedure(s) Performed: RIGHT TIBIA INTRAMEDULLARY (IM) NAIL (Right ) OPEN REDUCTION INTERNAL FIXATION (ORIF) TIBIAL PLATEAU (Left )  Patient Location: PACU  Anesthesia Type:General  Level of Consciousness: awake, alert  and patient cooperative  Airway & Oxygen Therapy: Patient Spontanous Breathing and Patient connected to face mask oxygen  Post-op Assessment: Report given to RN, Post -op Vital signs reviewed and stable and Patient moving all extremities X 4  Post vital signs: Reviewed and stable  Last Vitals:  Vitals:   05/29/17 0600 05/29/17 1100  BP: 121/72 (!) 135/97  Pulse: (!) 124 (!) 138  Resp:  20  Temp: 36.7 C 36.5 C  SpO2: 96% 94%    Last Pain:  Vitals:   05/29/17 0600  TempSrc: Oral  PainSc:       Patients Stated Pain Goal: 3 (01/27/93 8546)  Complications: No apparent anesthesia complications

## 2017-05-30 LAB — TYPE AND SCREEN
ABO/RH(D): O POS
ANTIBODY SCREEN: NEGATIVE
Unit division: 0
Unit division: 0

## 2017-05-30 LAB — BPAM RBC
BLOOD PRODUCT EXPIRATION DATE: 201811212359
BLOOD PRODUCT EXPIRATION DATE: 201811212359
ISSUE DATE / TIME: 201810260908
ISSUE DATE / TIME: 201810260908
UNIT TYPE AND RH: 5100
Unit Type and Rh: 5100

## 2017-05-30 MED ORDER — ALPRAZOLAM 0.5 MG PO TABS
1.0000 mg | ORAL_TABLET | Freq: Two times a day (BID) | ORAL | Status: DC
  Administered 2017-05-30 – 2017-06-02 (×8): 1 mg via ORAL
  Filled 2017-05-30 (×8): qty 2

## 2017-05-30 MED ORDER — SENNOSIDES-DOCUSATE SODIUM 8.6-50 MG PO TABS
1.0000 | ORAL_TABLET | Freq: Every day | ORAL | Status: DC
  Administered 2017-05-30 – 2017-06-10 (×10): 1 via ORAL
  Filled 2017-05-30 (×11): qty 1

## 2017-05-30 MED ORDER — POLYETHYLENE GLYCOL 3350 17 G PO PACK
17.0000 g | PACK | Freq: Every day | ORAL | Status: DC
  Administered 2017-05-30 – 2017-06-11 (×7): 17 g via ORAL
  Filled 2017-05-30 (×11): qty 1

## 2017-05-30 NOTE — Progress Notes (Signed)
Orthopaedic Trauma Progress Note  S: Doing well this AM, pain controlled. No orthopaedic issues overnight. Still on nasal cannula O2  O:  Vitals:   05/30/17 0030 05/30/17 0529  BP:  140/83  Pulse:  (!) 117  Resp:  18  Temp: 100.3 F (37.9 C) 98.5 F (36.9 C)  SpO2:  96%  LLE: Dressing clean dry and intact. Compartments soft and compressible. Neurovascularly intact distally. Able to bend and straighten leg.  RLE: Splint and dressing clean, dry and intact. Compartments soft and compressible. Wiggles toes. Brisk cap refill.  A/P: 31 year old male s/p IMN right tibia shaft fracture and ORIF left tibial plateau also with Sanders 2 right intra-articular calcaneus fracture  NWB BLE Elevate RLE when in bed PT/OT for slider board transfers [place] assistance with pneumonia Plan for dressing change Monday AM with possible ORIF of calcaneus early next week depending on swelling  Shona Needles, MD [facility] [phone] (phone)

## 2017-05-30 NOTE — Evaluation (Signed)
Physical Therapy Evaluation Patient Details Name: Wayne Grimes MRN: [id number] DOB: [birth year] Today's Date: 05/30/2017   History of Present Illness  Patient fell through a roof and broke bilareal lower extremitys. He is Non-weightbearing bilateral. PMH: Leukemia; PNA;   Clinical Impression  Patient was limited by pain today. He was unable to transfer to the wheelchair 2nd to difficulty maintaining a seated position at the edge of the bed. He has 3 steps into his house which may be a barrier to discharge. He would benefit from further skilled therapy to work on [employer] transfers and wheelchair mobility. He may also benefit from rehab at a SNF 2nd to inability to get into his house.     Follow Up Recommendations SNF    Equipment Recommendations  Wheelchair (measurements PT);Other (comment) (slide board )    Recommendations for Other Services       Precautions / Restrictions Precautions Precautions: Fall;Knee Restrictions Weight Bearing Restrictions: Yes RLE Weight Bearing: Non weight bearing LLE Weight Bearing: Non weight bearing      Mobility  Bed Mobility Overal bed mobility: Needs Assistance Bed Mobility: Supine to Sit;Sidelying to Sit   Sidelying to sit: Mod assist Supine to sit: Mod assist     General bed mobility comments: Mod a to get bilateral lower extremitys in and out of bed. Dedclined to get his feet to the floor 2nd to pain. Only able to sit for 5 min at the edge of the bed 2nd to pain   Transfers                 General transfer comment: Will need to work on [employer] transfer to wheelchair but uunable to today 2nd to pain   Ambulation/Gait                Stairs            Wheelchair Mobility    Modified Rankin (Stroke Patients Only)       Balance Overall balance assessment: Needs assistance Sitting-balance support: Single extremity supported Sitting balance-Leahy Scale: Fair                                       Pertinent Vitals/Pain      Home Living Family/patient expects to be discharged to:: Private residence   Available Help at Discharge: Family Type of Home: House Home Access: Stairs to enter Entrance Stairs-Rails: Can reach both Entrance Stairs-Number of Steps: 3          Prior Function Level of Independence: Independent               Hand Dominance   Dominant Hand: Right    Extremity/Trunk Assessment   Upper Extremity Assessment Upper Extremity Assessment: Overall WFL for tasks assessed    Lower Extremity Assessment Lower Extremity Assessment:  (unablet to asses 2nd to pain )       Communication   Communication: Prefers language other than English;Interpreter utilized  Cognition Arousal/Alertness: Awake/alert Behavior During Therapy: WFL for tasks assessed/performed Overall Cognitive Status: Within Functional Limits for tasks assessed                                        General Comments General comments (skin integrity, edema, etc.): bilateral leg wraps     Exercises  Assessment/Plan    PT Assessment Patient needs continued PT services  PT Problem List Decreased strength;Decreased range of motion;Decreased activity tolerance;Decreased knowledge of precautions       PT Treatment Interventions Functional mobility training;Therapeutic activities;Therapeutic exercise;Neuromuscular re-education;Wheelchair mobility training;Patient/family education    PT Goals (Current goals can be found in the Care Plan section)  Acute Rehab PT Goals Patient Stated Goal: less pain  PT Goal Formulation: With patient Time For Goal Achievement: 06/13/17 Potential to Achieve Goals: Good    Frequency Min 5X/week   Barriers to discharge Inaccessible home environment has 3 steps into his house     Co-evaluation               AM-PAC PT "6 Clicks" Daily Activity  Outcome Measure Difficulty turning over in bed (including  adjusting bedclothes, sheets and blankets)?: A Lot Difficulty moving from lying on back to sitting on the side of the bed? : A Lot Difficulty sitting down on and standing up from a chair with arms (e.g., wheelchair, bedside commode, etc,.)?: Unable Help needed moving to and from a bed to chair (including a wheelchair)?: Total Help needed walking in hospital room?: Total Help needed climbing 3-5 steps with a railing? : Total 6 Click Score: 8    End of Session Equipment Utilized During Treatment: Gait belt Activity Tolerance: Patient limited by pain Patient left: in bed;with call bell/phone within reach Nurse Communication: Mobility status PT Visit Diagnosis: Muscle weakness (generalized) (M62.81);Other abnormalities of gait and mobility (R26.89);Pain Pain - Right/Left: Right Pain - part of body: Leg    Time: 1350-1419 PT Time Calculation (min) (ACUTE ONLY): 29 min   Charges:   PT Evaluation $PT Eval Moderate Complexity: 1 Mod     PT G Codes:          Carney Living PT DPT  05/30/2017, 4:35 PM

## 2017-05-30 NOTE — Progress Notes (Signed)
PROGRESS NOTE    Wayne Grimes  HWT:[id number] DOB: September 16, 1985 DOA: 05/27/2017 PCP: Patient, No Pcp Per   Specialists:   Oncology Dr. 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 Left upper extremity DVT 06/2012, right IJ DVT 08/17/2012 History of rectal and peritoneal abscess 01/10/2016 status post surgery Prior subdural hematoma  Status post fall right tibia shaft fracture + left tibial plateau fracture with right calcaneal fracture  recent diagnosis of pneumonia--seen and evaluated initially by trauma surgery  Found to have low O2 sats, CT Angie was done because of prior DVT history Found to have multilobar pneumonia Consulted by trauma/orthopedics regarding concurrent management of pneumonia/AL   Assessment & Plan:   Principal Problem:   Accidental fall from ladder Active Problems:   Displaced comminuted fracture of shaft of right tibia, initial encounter for closed fracture   Displaced fracture of body of right calcaneus, initial encounter for closed fracture   Closed fracture of lateral portion of left tibial plateau   History of DVT (deep vein thrombosis)   Seems hemodynamically stable- --defer management of pain, wound care, disposition to orthopedics and postop therapy in terms of weightbearing status etc.  Sinus tach  May have some anxiety and may benefit from Xanax 1 mg twice daily continue as needed Ativan 0.5 IV--more importantly this may be pain and he may need per discretion of orthopedics up titration of his meds--he does have multiple fractures  History of ALL--currently managed at [facility] unremarkable with WBC 5.3 platelet 127 hemoglobin 9.7  Differential does not show any concerns   Dasatinib continued   cycle 5 of Blycinto scheduled 10/30 on hold as per his oncologist who I discussed with on 10/26  Recent diagnosis pneumonia about 10-14 days  prior placed on Levaquin Acute hypoxic respiratory failure initially needing 3 L O2  Narrow Zosyn started 10/25 for multi lobar pneumonia---->Levaquin p.o. 500 pending 11/3  Pulmonary toilet, flutter valve, incentive spirometer   Will assess the patient tomorrow and if no further internal medicine needs we will sign off   Lovenox Full code Inpatient Pending surgery and orthopedic input  Consultants:   Consulted by orthopedics  Procedures:   Various  Antimicrobials:   IV Zosyn   Subjective:  The pain is 8/10 otherwise he is okay But I do not think he has been getting the incentive spirometer Otherwise he asks whether he needs oxygen   Objective: Vitals:   05/29/17 1604 05/29/17 2331 05/30/17 0030 05/30/17 0529  BP: 110/67 113/64  140/83  Pulse: (!) 106 91  (!) 117  Resp:    18  Temp: 98.1 F (36.7 C) (!) 102.5 F (39.2 C) 100.3 F (37.9 C) 98.5 F (36.9 C)  TempSrc: Oral Oral Oral Oral  SpO2: 96% 92%  96%  Weight:      Height:        Intake/Output Summary (Last 24 hours) at 05/30/17 1140 Last data filed at 05/30/17 0900  Gross per 24 hour  Intake              290 ml  Output             2000 ml  Net            -1710 ml   Filed Weights   05/27/17 1513  Weight: 101.6 kg (224 lb)    Examination:  eomi ncat cta b no added  sounds no rales no rhonchi Thick neck No pallor no ict cta b abd soft nt nd no rebound Wounds were not examined  Data Reviewed: I have personally reviewed following labs and imaging studies  CBC:  Recent Labs Lab 05/27/17 1959 05/27/17 2240 05/29/17 0429 05/29/17 0836 05/29/17 1012 05/29/17 1200  WBC 5.5 5.5 5.3  --   --  4.1  NEUTROABS 4.1  --  3.2  --   --   --   HGB 10.9* 10.6* 9.7* 7.5* 7.8* 10.0*  HCT 33.2* 32.7* 30.4* 22.0* 23.0* 29.6*  MCV 93.0 92.6 93.5  --   --  91.6  PLT 185 180 127*  --   --  053*   Basic Metabolic Panel:  Recent Labs Lab 05/27/17 1959 05/27/17 2240 05/29/17 0429 05/29/17 0836  05/29/17 1012  NA 140  --  138 137 138  K 3.3*  --  3.5 3.6 3.7  CL 108  --  103  --   --   CO2 24  --  28  --   --   GLUCOSE 161*  --  117*  --   --   BUN 12  --  5*  --   --   CREATININE 0.75 0.69 0.69  --   --   CALCIUM 8.2*  --  8.1*  --   --    GFR: Estimated Creatinine Clearance: 152 mL/min (by C-G formula based on SCr of 0.69 mg/dL). Liver Function Tests:  Recent Labs Lab 05/29/17 0429  AST 22  ALT 19  ALKPHOS 56  BILITOT 0.8  PROT 4.8*  ALBUMIN 2.8*   No results for input(s): LIPASE, AMYLASE in the last 168 hours. No results for input(s): AMMONIA in the last 168 hours. Coagulation Profile:  Recent Labs Lab 05/29/17 0429  INR 1.10   Cardiac Enzymes: No results for input(s): CKTOTAL, CKMB, CKMBINDEX, TROPONINI in the last 168 hours. BNP (last 3 results) No results for input(s): PROBNP in the last 8760 hours. HbA1C: No results for input(s): HGBA1C in the last 72 hours. CBG: No results for input(s): GLUCAP in the last 168 hours. Lipid Profile: No results for input(s): CHOL, HDL, LDLCALC, TRIG, CHOLHDL, LDLDIRECT in the last 72 hours. Thyroid Function Tests: No results for input(s): TSH, T4TOTAL, FREET4, T3FREE, THYROIDAB in the last 72 hours. Anemia Panel: No results for input(s): VITAMINB12, FOLATE, FERRITIN, TIBC, IRON, RETICCTPCT in the last 72 hours. Urine analysis: No results found for: COLORURINE, APPEARANCEUR, LABSPEC, [place], GLUCOSEU, [place], [place], [place], [place], [place], NITRITE, [place]   Radiology Studies: Reviewed images personally in health database    Scheduled Meds: . albuterol  2.5 mg Nebulization Once  . ALPRAZolam  1 mg Oral BID  . dasatinib  70 mg Oral QHS  . docusate sodium  100 mg Oral BID  . enoxaparin (LOVENOX) injection  40 mg Subcutaneous Q24H  . methocarbamol  1,000 mg Oral QID   Continuous Infusions: . sodium chloride    . 0.9 % NaCl with KCl 20 mEq / L 150 mL/hr at 05/28/17 2111  .  methocarbamol (ROBAXIN)  IV    . piperacillin-tazobactam (ZOSYN)  IV Stopped (05/30/17 0924)     LOS: 3 days    Time spent: [place], MD [facility]   If 7PM-7AM, please contact night-coverage [email or web address] Password [email or web address] 05/30/2017, 11:40 AM

## 2017-05-31 DIAGNOSIS — J189 Pneumonia, unspecified organism: Secondary | ICD-10-CM | POA: Diagnosis present

## 2017-05-31 LAB — BASIC METABOLIC PANEL
ANION GAP: 8 (ref 5–15)
BUN: 5 mg/dL — ABNORMAL LOW (ref 6–20)
CO2: 30 mmol/L (ref 22–32)
Calcium: 8.1 mg/dL — ABNORMAL LOW (ref 8.9–10.3)
Chloride: 97 mmol/L — ABNORMAL LOW (ref 101–111)
Creatinine, Ser: 0.62 mg/dL (ref 0.61–1.24)
GFR calc Af Amer: >60 mL/min (ref >60)
Glucose, Bld: 155 mg/dL — ABNORMAL HIGH (ref 65–99)
POTASSIUM: 3.4 mmol/L — AB (ref 3.5–5.1)
SODIUM: 135 mmol/L (ref 135–145)

## 2017-05-31 LAB — TSH: TSH: 5.654 u[IU]/mL — ABNORMAL HIGH (ref 0.350–4.500)

## 2017-05-31 LAB — CBC
HEMATOCRIT: 32.6 % — AB (ref 39.0–52.0)
HEMOGLOBIN: 10.6 g/dL — AB (ref 13.0–17.0)
MCH: 30.1 pg (ref 26.0–34.0)
MCHC: 32.5 g/dL (ref 30.0–36.0)
MCV: 92.6 fL (ref 78.0–100.0)
Platelets: 145 10*3/uL — ABNORMAL LOW (ref 150–400)
RBC: 3.52 MIL/uL — ABNORMAL LOW (ref 4.22–5.81)
RDW: 15.5 % (ref 11.5–15.5)
WBC: 5.2 10*3/uL (ref 4.0–10.5)

## 2017-05-31 MED ORDER — METOPROLOL TARTRATE 12.5 MG HALF TABLET
12.5000 mg | ORAL_TABLET | Freq: Two times a day (BID) | ORAL | Status: DC
  Administered 2017-05-31 – 2017-06-01 (×3): 12.5 mg via ORAL
  Filled 2017-05-31 (×3): qty 1

## 2017-05-31 MED ORDER — KETOROLAC TROMETHAMINE 15 MG/ML IJ SOLN
15.0000 mg | Freq: Four times a day (QID) | INTRAMUSCULAR | Status: AC
  Administered 2017-05-31 – 2017-06-05 (×20): 15 mg via INTRAVENOUS
  Filled 2017-05-31 (×20): qty 1

## 2017-05-31 MED ORDER — OXYCODONE HCL 5 MG PO TABS
10.0000 mg | ORAL_TABLET | ORAL | Status: DC | PRN
  Administered 2017-05-31 – 2017-06-01 (×3): 15 mg via ORAL
  Administered 2017-06-01: 10 mg via ORAL
  Filled 2017-05-31 (×4): qty 3

## 2017-05-31 NOTE — Progress Notes (Signed)
PROGRESS NOTE    Wayne Grimes  UJW:[id number] DOB: October 27, 1985 DOA: 05/27/2017 PCP: Patient, No Pcp Per   Specialists:   Oncology Dr. 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 Left upper extremity DVT 06/2012, right IJ DVT 08/17/2012 History of rectal and peritoneal abscess 01/10/2016 status post surgery Prior subdural hematoma  Status post fall right tibia shaft fracture + left tibial plateau fracture with right calcaneal fracture  recent diagnosis of pneumonia--seen and evaluated initially by trauma surgery  Found to have low O2 sats, CT Angie was done because of prior DVT history Found to have multilobar pneumonia Consulted by trauma/orthopedics regarding concurrent management of pneumonia/AL   Assessment & Plan:   Principal Problem:   Accidental fall from ladder Active Problems:   Displaced comminuted fracture of shaft of right tibia, initial encounter for closed fracture   Displaced fracture of body of right calcaneus, initial encounter for closed fracture   Closed fracture of lateral portion of left tibial plateau   History of DVT (deep vein thrombosis)   Seems hemodynamically stable- --defer management of pain, wound care, disposition to orthopedics and postop therapy in terms of weightbearing status etc. -Suggest for simplicity sake starting PCA as pain control has been between 7 and 8/10 whenever I have seen him.  Might need further titration if going for further surgery  Sinus tach-etiology unclear Potentially related to pain  TSH ? nightly -with acute surgery, unclear how to interpret and would not adjust any meds at present or  start anything new in a young male without any prior history  Schedule Xanax has not made much of a difference so doubt this is anxiety  Is not volume depleted  Will get US Dopplers of LE's to rule out DVT --polytrauma could pre-dispose  Will  start low-dose metoprolol 12.5 twice daily and reassess in a.m.  History of ALL--currently managed at [facility] unremarkable with WBC 5.3 platelet 127 hemoglobin 9.7  Differential does not show any concerns   Dasatinib continued   cycle 5 of Blycinto scheduled 10/30 on hold as per his oncologist who I discussed with on 10/26  Recent diagnosis pneumonia about 10-14 days prior placed on Levaquin Acute hypoxic respiratory failure initially needing 3 L O2  Narrow Zosyn started 10/25 for multi lobar pneumonia---->Levaquin p.o. 500 stopping 11/3  Pulmonary toilet, flutter valve, incentive spirometer   I would review the patient again tomorrow for sinus tach   Lovenox Full code Inpatient Pending surgery and orthopedic input  Consultants:   Consulted by orthopedics  Procedures:   Various  Antimicrobials:   IV Zosyn   Subjective:  The pain is 8/10 otherwise he is okay He is not coughing He has no sputum He has no chills He has no rigor    Objective: Vitals:   05/30/17 1500 05/30/17 2120 05/31/17 0403 05/31/17 1347  BP: 130/69 138/76 135/77 130/87  Pulse: 100 (!) 120 (!) 138 (!) 140  Resp: 18 18 16 20   Temp: (!) 101.5 F (38.6 C) 98.7 F (37.1 C) 99.6 F (37.6 C) 99.3 F (37.4 C)  TempSrc: Oral Oral Oral Oral  SpO2: 94% 93% 91% 93%  Weight:      Height:        Intake/Output Summary (Last 24 hours) at 05/31/17 1353 Last data filed at 05/31/17 0901  Gross per 24 hour  Intake  600 ml  Output             2450 ml  Net            -1850 ml   Filed Weights   05/27/17 1513  Weight: 101.6 kg (224 lb)    Examination:  eomi ncat not appearing anxious or confused cta b no added sounds no rales no rhonchi Thick neck No pallor no ict cta b abd soft nt nd no rebound Wounds were not examined as were bandaged  Data Reviewed: I have personally reviewed following labs and imaging studies  CBC:  Recent Labs Lab 05/27/17 1959  05/27/17 2240 05/29/17 0429 05/29/17 0836 05/29/17 1012 05/29/17 1200 05/31/17 0914  WBC 5.5 5.5 5.3  --   --  4.1 5.2  NEUTROABS 4.1  --  3.2  --   --   --   --   HGB 10.9* 10.6* 9.7* 7.5* 7.8* 10.0* 10.6*  HCT 33.2* 32.7* 30.4* 22.0* 23.0* 29.6* 32.6*  MCV 93.0 92.6 93.5  --   --  91.6 92.6  PLT 185 180 127*  --   --  104* 371*   Basic Metabolic Panel:  Recent Labs Lab 05/27/17 1959 05/27/17 2240 05/29/17 0429 05/29/17 0836 05/29/17 1012 05/31/17 0914  NA 140  --  138 137 138 135  K 3.3*  --  3.5 3.6 3.7 3.4*  CL 108  --  103  --   --  97*  CO2 24  --  28  --   --  30  GLUCOSE 161*  --  117*  --   --  155*  BUN 12  --  5*  --   --  5*  CREATININE 0.75 0.69 0.69  --   --  0.62  CALCIUM 8.2*  --  8.1*  --   --  8.1*   GFR: Estimated Creatinine Clearance: 152 mL/min (by C-G formula based on SCr of 0.62 mg/dL). Liver Function Tests:  Recent Labs Lab 05/29/17 0429  AST 22  ALT 19  ALKPHOS 56  BILITOT 0.8  PROT 4.8*  ALBUMIN 2.8*   No results for input(s): LIPASE, AMYLASE in the last 168 hours. No results for input(s): AMMONIA in the last 168 hours. Coagulation Profile:  Recent Labs Lab 05/29/17 0429  INR 1.10   Cardiac Enzymes: No results for input(s): CKTOTAL, CKMB, CKMBINDEX, TROPONINI in the last 168 hours. BNP (last 3 results) No results for input(s): PROBNP in the last 8760 hours. HbA1C: No results for input(s): HGBA1C in the last 72 hours. CBG: No results for input(s): GLUCAP in the last 168 hours. Lipid Profile: No results for input(s): CHOL, HDL, LDLCALC, TRIG, CHOLHDL, LDLDIRECT in the last 72 hours. Thyroid Function Tests:  Recent Labs  05/31/17 0914  TSH 5.654*   Anemia Panel: No results for input(s): VITAMINB12, FOLATE, FERRITIN, TIBC, IRON, RETICCTPCT in the last 72 hours. Urine analysis: No results found for: COLORURINE, APPEARANCEUR, LABSPEC, [place], GLUCOSEU, [place], [place], [place], [place], [place], NITRITE,  [place]   Radiology Studies: Reviewed images personally in health database    Scheduled Meds: . albuterol  2.5 mg Nebulization Once  . ALPRAZolam  1 mg Oral BID  . dasatinib  70 mg Oral QHS  . enoxaparin (LOVENOX) injection  40 mg Subcutaneous Q24H  . methocarbamol  1,000 mg Oral QID  . metoprolol tartrate  12.5 mg Oral BID  . polyethylene glycol  17 g Oral Daily  . senna-docusate  1 tablet Oral  QHS   Continuous Infusions: . sodium chloride    . 0.9 % NaCl with KCl 20 mEq / L 150 mL/hr at 05/28/17 2111  . methocarbamol (ROBAXIN)  IV    . piperacillin-tazobactam (ZOSYN)  IV 3.375 g (05/31/17 1352)     LOS: 4 days    Time spent: [place], MD [facility]   If 7PM-7AM, please contact night-coverage [email or web address] Password [email or web address] 05/31/2017, 1:53 PM

## 2017-05-31 NOTE — Progress Notes (Signed)
Orthopaedic Trauma Progress Note  S: Still tachycardic. Feels okay. No SOB, no chest pain, pain in legs improving  O:  Vitals:   05/30/17 2120 05/31/17 0403  BP: 138/76 135/77  Pulse: (!) 120 (!) 138  Resp: 18 16  Temp: 98.7 F (37.1 C) 99.6 F (37.6 C)  SpO2: 93% 91%  LLE: Dressing clean dry and intact. Compartments soft and compressible. Neurovascularly intact distally. Able to bend and straighten leg.  RLE: Splint and dressing clean, dry and intact. Compartments soft and compressible. Wiggles toes. Brisk cap refill.  A/P: 31 year old male s/p IMN right tibia shaft fracture and ORIF left tibial plateau also with Sanders 2 right intra-articular calcaneus fracture  NWB BLE Elevate RLE when in bed PT/OT for slider board transfers [place] assistance with pneumonia Plan for dressing change Monday AM with possible ORIF of calcaneus early next week depending on swelling  Shona Needles, MD [facility] [phone] (phone)

## 2017-05-31 NOTE — Progress Notes (Addendum)
Inpatient Rehabilitation  Per OT request, patient was screened by Gunnar Fusi for appropriateness for an Inpatient Acute Rehab consult.  However, note that at this time PT is recommending SNF.  Will plan to follow up tomorrow after most recent PT session to determine if tolerance has improved.  Call if questions.    Carmelia Roller., CCC/SLP Admission Coordinator  [place]  Cell [phone]

## 2017-05-31 NOTE — Evaluation (Signed)
Occupational Therapy Evaluation Patient Details Name: Wayne Grimes MRN: [id number] DOB: 06-Nov-1985 Today's Date: 05/31/2017    History of Present Illness Pt is a 31 y.o. male who sustained a 12 foot fall from a ladder. 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 Recently diagnosed with community-aquired pneumonia. Pt with L tibial plateau fracture s/p ORIF and R tibial shaft fracture s/p IM nailing. Also sustained R calcaneus fracture.    Clinical Impression   PTA, pt was independent with ADL and functional mobility. Pt currently requires total assist for LB ADL, max assist +3 for anterior-posterior simulated toilet transfers, and min assist for UB ADL. Initiated education concerning compensatory strategies for LB ADL including use of AE and pt requiring max-total assist to utilize this session. He would benefit from continued OT services while admitted to improve independence with ADL and functional mobility. Pt very independent at baseline and would benefit from CIR level therapies post-acute D/C to maximize functional return to independence. Will continue to follow while admitted.    Follow Up Recommendations  CIR;Supervision/Assistance - 24 hour    Equipment Recommendations  3 in 1 bedside commode (drop-arm)    Recommendations for Other Services Rehab consult     Precautions / Restrictions Precautions Precautions: Fall;Knee Restrictions Weight Bearing Restrictions: Yes RLE Weight Bearing: Non weight bearing LLE Weight Bearing: Non weight bearing      Mobility Bed Mobility Overal bed mobility: Needs Assistance Bed Mobility: Sit to Supine   Sidelying to sit: +2 for physical assistance;Mod assist   Sit to supine: Max assist;+2 for physical assistance (+3)   General bed mobility comments: Once returned to bed, with max assist +3, pt assisted to long sitting and then returned to supine with assist.  Transfers Overall transfer level:  Needs assistance   Transfers: Comptroller transfers: Max assist (+3 physical assistance)   General transfer comment: 1 person to assist for B LE support, 2 to assist with bed pad. Pt able to assist by pushing forward with B arm rests.     Balance Overall balance assessment: Needs assistance Sitting-balance support: Single extremity supported;Bilateral upper extremity supported;Feet supported Sitting balance-Leahy Scale: Fair                                     ADL either performed or assessed with clinical judgement   ADL Overall ADL's : Needs assistance/impaired Eating/Feeding: Set up;Sitting   Grooming: Set up;Sitting   Upper Body Bathing: Minimal assistance;Sitting   Lower Body Bathing: Total assistance;Sitting/lateral leans   Upper Body Dressing : Minimal assistance;Sitting   Lower Body Dressing: Sitting/lateral leans;Maximal assistance   Toilet Transfer: Maximal assistance;Anterior/posterior (+3 assist)   Toileting- Clothing Manipulation and Hygiene: Total assistance;Sitting/lateral lean         General ADL Comments: Pt educated concerning use of sock aide and reacher for LB dressing tasks while adhering to precautions. Required max assist for use of this.      Vision Patient Visual Report: No change from baseline Vision Assessment?: No apparent visual deficits     Perception     Praxis      Pertinent Vitals/Pain Pain Assessment: Faces Faces Pain Scale: Hurts whole lot Pain Location: B LE Pain Descriptors / Indicators: Aching;Grimacing;Operative site guarding Pain Intervention(s): Limited activity within patient's tolerance;Monitored during session;Repositioned     Hand Dominance Right   Extremity/Trunk Assessment Upper  Extremity Assessment Upper Extremity Assessment: Overall WFL for tasks assessed   Lower Extremity Assessment Lower Extremity Assessment: RLE deficits/detail;LLE  deficits/detail RLE Deficits / Details: NWB and with bulky dressing.  RLE: Unable to fully assess due to pain LLE Deficits / Details: NWB and wrapped in ace wrap LLE: Unable to fully assess due to pain       Communication Communication Communication: Prefers language other than English (pt communicating well)   Cognition Arousal/Alertness: Awake/alert Behavior During Therapy: WFL for tasks assessed/performed Overall Cognitive Status: Within Functional Limits for tasks assessed                                     General Comments       Exercises     Shoulder Instructions      Home Living Family/patient expects to be discharged to:: Private residence Living Arrangements:  (reports alone to OT) Available Help at Discharge: Family Type of Home: House Home Access: Stairs to enter Technical brewer of Steps: 3 Entrance Stairs-Rails: Can reach both       Bathroom Shower/Tub: Tub/shower unit                    Prior Functioning/Environment Level of Independence: Independent        Comments: Working        OT Problem List: Decreased strength;Decreased activity tolerance;Impaired balance (sitting and/or standing);Decreased safety awareness;Decreased knowledge of use of DME or AE;Decreased knowledge of precautions;Pain      OT Treatment/Interventions: Self-care/ADL training;Therapeutic exercise;Energy conservation;DME and/or AE instruction;Therapeutic activities;Patient/family education;Balance training    OT Goals(Current goals can be found in the care plan section) Acute Rehab OT Goals Patient Stated Goal: less pain  OT Goal Formulation: With patient Time For Goal Achievement: 06/14/17 Potential to Achieve Goals: Good ADL Goals Pt Will Perform Grooming: with modified independence;sitting (w/c level) Pt Will Perform Upper Body Dressing: with modified independence;sitting (w/c level) Pt Will Perform Lower Body Dressing: with min  assist;sitting/lateral leans Pt Will Transfer to Toilet: with min assist;with transfer board;bedside commode (drop-arm BSC) Pt Will Perform Toileting - Clothing Manipulation and hygiene: with min assist;sitting/lateral leans  OT Frequency: Min 3X/week   Barriers to D/C:            Co-evaluation              AM-PAC PT "6 Clicks" Daily Activity     Outcome Measure Help from another person eating meals?: None Help from another person taking care of personal grooming?: A Little Help from another person toileting, which includes using toliet, bedpan, or urinal?: A Lot Help from another person bathing (including washing, rinsing, drying)?: A Lot Help from another person to put on and taking off regular upper body clothing?: A Little Help from another person to put on and taking off regular lower body clothing?: A Lot 6 Click Score: 16   End of Session Nurse Communication: Mobility status  Activity Tolerance: Patient tolerated treatment well Patient left: in bed;with call bell/phone within reach  OT Visit Diagnosis: Other abnormalities of gait and mobility (R26.89);Pain Pain - Right/Left:  (bilateral) Pain - part of body: Leg                Time: 0935-1005 OT Time Calculation (min): 30 min Charges:  OT General Charges $OT Visit: 1 Visit OT Evaluation $OT Eval Moderate Complexity: 1 Mod OT Treatments $Self Care/Home Management :  8-22 mins G-Codes:     Norman Herrlich, MS OTR/L  Pager: [place] Hypes 05/31/2017, 11:00 AM

## 2017-05-31 NOTE — Progress Notes (Signed)
Physical Therapy Treatment Patient Details Name: Wayne Grimes MRN: [id number] DOB: 27-Apr-1986 Today's Date: 05/31/2017    History of Present Illness Patient fell through a roof and broke bilareal lower extremitys. He is Non-weightbearing bilateral. PMH: Leukemia; PNA;     PT Comments    Pt reports 8/10 pain bil LE's but agreeable to participate in PT session.  Very pleasant.  Completed bed to recliner transfer using anterior posterior technique requiring +3 assistance to complete transition due to pain in bil LE's.   RN assisted with transfer.  Educated Therapist, sports on how to transfer back to bed.  RN verbalizes understanding.  Spoke to OT regarding assistance required and she states she will plan to assist pt back to bed during her visit.        Follow Up Recommendations  SNF     Equipment Recommendations       Recommendations for Other Services       Precautions / Restrictions Precautions Precautions: Fall;Knee Restrictions RLE Weight Bearing: Non weight bearing LLE Weight Bearing: Non weight bearing    Mobility  Bed Mobility Overal bed mobility: Needs Assistance Bed Mobility: Supine to Sit   Sidelying to sit: +2 for physical assistance;Mod assist       General bed mobility comments: assistance for LE support & use of draw pad to assist with pivoting hips around to EOB.    Transfers Overall transfer level: Needs assistance   Transfers: Comptroller transfers: Mod assist (+3 physical assistance)   General transfer comment: 1 person to support bil LE's, 2 people to assist on each side of pt by using draw pad to lift & scoot hips posteriorly from bed to recliner  Ambulation/Gait                 Stairs            Wheelchair Mobility    Modified Rankin (Stroke Patients Only)       Balance                                            Cognition Arousal/Alertness: Awake/alert Behavior  During Therapy: WFL for tasks assessed/performed Overall Cognitive Status: Within Functional Limits for tasks assessed                                        Exercises      General Comments        Pertinent Vitals/Pain      Home Living                      Prior Function            PT Goals (current goals can now be found in the care plan section) Acute Rehab PT Goals Patient Stated Goal: less pain  PT Goal Formulation: With patient Time For Goal Achievement: 06/13/17 Potential to Achieve Goals: Good    Frequency    Min 5X/week      PT Plan Current plan remains appropriate    Co-evaluation              AM-PAC PT "6 Clicks" Daily Activity  Outcome Measure  End of Session   Activity Tolerance: Patient tolerated treatment well;Patient limited by pain Patient left: in chair;with call bell/phone within reach Nurse Communication: Mobility status       Time: 5956-3875 PT Time Calculation (min) (ACUTE ONLY): 28 min  Charges:  $Therapeutic Activity: 23-37 mins                     Sarajane Marek, PTA  05/31/2017    Sena Hitch 05/31/2017, 9:04 AM

## 2017-06-01 ENCOUNTER — Inpatient Hospital Stay (HOSPITAL_COMMUNITY): Payer: Worker's Compensation

## 2017-06-01 ENCOUNTER — Encounter (HOSPITAL_COMMUNITY): Payer: Self-pay | Admitting: Student

## 2017-06-01 DIAGNOSIS — Z86718 Personal history of other venous thrombosis and embolism: Secondary | ICD-10-CM

## 2017-06-01 MED ORDER — DOXYCYCLINE HYCLATE 100 MG PO TABS
100.0000 mg | ORAL_TABLET | Freq: Two times a day (BID) | ORAL | Status: DC
  Administered 2017-06-01 – 2017-06-03 (×5): 100 mg via ORAL
  Filled 2017-06-01 (×5): qty 1

## 2017-06-01 MED ORDER — LEVOFLOXACIN 500 MG PO TABS: 500.0000 mg | ORAL_TABLET | Freq: Every day | ORAL | Status: DC

## 2017-06-01 MED ORDER — OXYCODONE HCL 5 MG PO TABS
5.0000 mg | ORAL_TABLET | ORAL | Status: DC | PRN
  Administered 2017-06-01 – 2017-06-02 (×3): 10 mg via ORAL
  Filled 2017-06-01 (×3): qty 2

## 2017-06-01 MED ORDER — METOPROLOL TARTRATE 25 MG PO TABS
25.0000 mg | ORAL_TABLET | Freq: Two times a day (BID) | ORAL | Status: DC
  Administered 2017-06-01 – 2017-06-11 (×20): 25 mg via ORAL
  Filled 2017-06-01 (×20): qty 1

## 2017-06-01 NOTE — Progress Notes (Signed)
PROGRESS NOTE    Wayne Grimes  YQM:[id number] DOB: 1986/04/04 DOA: 05/27/2017 PCP: Patient, No Pcp Per   Specialists:   Oncology Dr. 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 Left upper extremity DVT 06/2012, right IJ DVT 08/17/2012 History of rectal and peritoneal abscess 01/10/2016 status post surgery Prior subdural hematoma  Status post fall right tibia shaft fracture + left tibial plateau fracture with right calcaneal fracture  recent diagnosis of pneumonia--seen and evaluated initially by trauma surgery  Found to have low O2 sats, CT Angie was done because of prior DVT history Found to have multilobar pneumonia Consulted by trauma/orthopedics regarding concurrent management of pneumonia/AL   Assessment & Plan:   Principal Problem:   Accidental fall from ladder Active Problems:   Displaced comminuted fracture of shaft of right tibia, initial encounter for closed fracture   Displaced fracture of body of right calcaneus, initial encounter for closed fracture   Closed fracture of lateral portion of left tibial plateau   History of DVT (deep vein thrombosis)   Pneumonia   Seems hemodynamically stable- --defer management of pain, wound care, disposition to orthopedics and postop therapy in terms of weightbearing status etc. -Suggest for simplicity sake starting PCA as pain control has been between 7 and 8/10 whenever I have seen him.  Might need further titration if going for further surgery  Sinus tach-etiology unclear Potentially related to pain  TSH ? nightly -s/p acute surgery-not adjust any meds/new meds in a young male without any prior history  Schedule Xanax has not made much of a difference so doubt this is anxiety  Is not volume depleted  Will get US Dopplers of LE's to rule out DVT --polytrauma could pre-dispose--Per Dr. Doreatha Martin can remove splint to get dopplers  Will  start low-dose metoprolol 12.5 bi--increasing to 25 bid and observe effect--consider ivradabine vs cardiology input in am if no better  History of ALL--currently managed at [facility] unremarkable with WBC 5.3 platelet 127 hemoglobin 9.7  Differential does not show any concerns   Dasatinib continued   cycle 5 of [place] scheduled 10/30 on hold as per his oncologist who I discussed with on 10/26  Recent diagnosis pneumonia about 10-14 days prior placed on Levaquin Acute hypoxic respiratory failure initially needing 3 L O2  Narrow Zosyn started 10/25 for multi lobar pneumonia----> 10/29 Levaquin p.o. 500 stopping 11/3  Pulmonary toilet, flutter valve, incentive spirometer   Will continue to follow patient   Lovenox Full code Inpatient Pending surgery and orthopedic input  Consultants:   Consulted by orthopedics  Procedures:   Various  Antimicrobials:   IV Zosyn   Subjective:  Overall better No sputum No cough Still tachy-some fever yesterday am but not sustained    Objective: Vitals:   05/31/17 0403 05/31/17 1347 05/31/17 2047 06/01/17 0447  BP: 135/77 130/87 119/72 118/73  Pulse: (!) 138 (!) 140 (!) 134 (!) 126  Resp: 16 20 18    Temp: 99.6 F (37.6 C) 99.3 F (37.4 C) 98.4 F (36.9 C) 99.3 F (37.4 C)  TempSrc: Oral Oral Oral Oral  SpO2: 91% 93%  95%  Weight:      Height:        Intake/Output Summary (Last 24 hours) at 06/01/17 1321 Last data filed at 06/01/17 0451  Gross per 24 hour  Intake  480 ml  Output             1350 ml  Net             -870 ml   Filed Weights   05/27/17 1513  Weight: 101.6 kg (224 lb)    Examination:  not anxious or confused cta b no added sounds no rales no rhonchi s1 s2 tachy 120 range--RRR Thick neck No pallor no ict cta b abd soft nt nd no rebound Wounds  bandaged --ones that are open well sutured without deficit  Data Reviewed: I have personally reviewed following labs and  imaging studies  CBC:  Recent Labs Lab 05/27/17 1959 05/27/17 2240 05/29/17 0429 05/29/17 0836 05/29/17 1012 05/29/17 1200 05/31/17 0914  WBC 5.5 5.5 5.3  --   --  4.1 5.2  NEUTROABS 4.1  --  3.2  --   --   --   --   HGB 10.9* 10.6* 9.7* 7.5* 7.8* 10.0* 10.6*  HCT 33.2* 32.7* 30.4* 22.0* 23.0* 29.6* 32.6*  MCV 93.0 92.6 93.5  --   --  91.6 92.6  PLT 185 180 127*  --   --  104* 341*   Basic Metabolic Panel:  Recent Labs Lab 05/27/17 1959 05/27/17 2240 05/29/17 0429 05/29/17 0836 05/29/17 1012 05/31/17 0914  NA 140  --  138 137 138 135  K 3.3*  --  3.5 3.6 3.7 3.4*  CL 108  --  103  --   --  97*  CO2 24  --  28  --   --  30  GLUCOSE 161*  --  117*  --   --  155*  BUN 12  --  5*  --   --  5*  CREATININE 0.75 0.69 0.69  --   --  0.62  CALCIUM 8.2*  --  8.1*  --   --  8.1*   GFR: Estimated Creatinine Clearance: 152 mL/min (by C-G formula based on SCr of 0.62 mg/dL). Liver Function Tests:  Recent Labs Lab 05/29/17 0429  AST 22  ALT 19  ALKPHOS 56  BILITOT 0.8  PROT 4.8*  ALBUMIN 2.8*   No results for input(s): LIPASE, AMYLASE in the last 168 hours. No results for input(s): AMMONIA in the last 168 hours. Coagulation Profile:  Recent Labs Lab 05/29/17 0429  INR 1.10   Cardiac Enzymes: No results for input(s): CKTOTAL, CKMB, CKMBINDEX, TROPONINI in the last 168 hours. BNP (last 3 results) No results for input(s): PROBNP in the last 8760 hours. HbA1C: No results for input(s): HGBA1C in the last 72 hours. CBG: No results for input(s): GLUCAP in the last 168 hours. Lipid Profile: No results for input(s): CHOL, HDL, LDLCALC, TRIG, CHOLHDL, LDLDIRECT in the last 72 hours. Thyroid Function Tests:  Recent Labs  05/31/17 0914  TSH 5.654*   Anemia Panel: No results for input(s): VITAMINB12, FOLATE, FERRITIN, TIBC, IRON, RETICCTPCT in the last 72 hours. Urine analysis: No results found for: COLORURINE, APPEARANCEUR, LABSPEC, [place], GLUCOSEU, [place],  [place], [place], [place], [place], NITRITE, [place]   Radiology Studies: Reviewed images personally in health database    Scheduled Meds: . albuterol  2.5 mg Nebulization Once  . ALPRAZolam  1 mg Oral BID  . dasatinib  70 mg Oral QHS  . enoxaparin (LOVENOX) injection  40 mg Subcutaneous Q24H  . ketorolac  15 mg Intravenous Q6H  . methocarbamol  1,000 mg Oral QID  . metoprolol tartrate  12.5 mg Oral BID  .  polyethylene glycol  17 g Oral Daily  . senna-docusate  1 tablet Oral QHS   Continuous Infusions: . sodium chloride    . 0.9 % NaCl with KCl 20 mEq / L 150 mL/hr at 06/01/17 0949  . methocarbamol (ROBAXIN)  IV    . piperacillin-tazobactam (ZOSYN)  IV 3.375 g (06/01/17 1306)     LOS: 5 days    Time spent: [place], MD [facility]   If 7PM-7AM, please contact night-coverage [email or web address] Password [email or web address] 06/01/2017, 1:21 PM

## 2017-06-01 NOTE — Progress Notes (Signed)
VASCULAR LAB PRELIMINARY  PRELIMINARY  PRELIMINARY  PRELIMINARY  Bilateral lower extremity venous duplex completed.    Preliminary report may be found under results review.   Jden Want, RVT 06/01/2017, 2:47 PM

## 2017-06-01 NOTE — Progress Notes (Signed)
Orthopaedic Trauma Progress Note  S: Still tachycardic. Pain improved with adjustment of medication but still hurting in leg  O:  Vitals:   05/31/17 2047 06/01/17 0447  BP: 119/72 118/73  Pulse: (!) 134 (!) 126  Resp: 18   Temp: 98.4 F (36.9 C) 99.3 F (37.4 C)  SpO2:  95%  LLE: incisions clean dry and intact. Compartments soft and compressible. Neurovascularly intact distally. Able to bend and straighten leg.  RLE: Splint taken down, skin wrinkling over lateral aspect of calacneus. There is a pressure sore over the anterior ankle from the compressive wrap. Patient . Compartments soft and compressible. Neuro intact. Brisk cap refill.  A/P: 31 year old male s/p IMN right tibia shaft fracture and ORIF left tibial plateau also with Sanders 2 right intra-articular calcaneus fracture  NWB BLE Elevate RLE when in bed PT/OT for slider board transfers [place] assistance with pneumonia and tachycardia, okay with proceeding with doppler US as needed Place in boot today Tentatively plan for ORIF of calcaneus Wednesday  Shona Needles, MD [facility] [phone] (phone)

## 2017-06-01 NOTE — Progress Notes (Signed)
Physical Therapy Treatment Patient Details Name: Wayne Grimes MRN: [id number] DOB: 13-Nov-1985 Today's Date: 06/01/2017    History of Present Illness Pt is a 31 y.o. male who sustained a 12 foot fall from a ladder. 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 Recently diagnosed with community-aquired pneumonia. Pt with L tibial plateau fracture s/p ORIF and R tibial shaft fracture s/p IM nailing. Also sustained R calcaneus fracture.     PT Comments    Patient progressing slowly, but able to demonstrate lateral scoot transfers as other option for mobilizing OOB and seems to tolerate better this session.  Continues to report would not have enough support at home and rents so states could not build a ramp.  May need SNF level rehab at d/c if no family available to assist.  PT to follow.   Follow Up Recommendations  SNF     Equipment Recommendations  Wheelchair (measurements PT);Other (comment);Wheelchair cushion (measurements PT) (slide board)    Recommendations for Other Services       Precautions / Restrictions Precautions Precautions: Fall;Knee Restrictions RLE Weight Bearing: Non weight bearing LLE Weight Bearing: Non weight bearing    Mobility  Bed Mobility Overal bed mobility: Needs Assistance Bed Mobility: Sit to Supine     Supine to sit: Mod assist;HOB elevated;+2 for physical assistance     General bed mobility comments: assist for leg support and for scooting forward on pad  Transfers Overall transfer level: Needs assistance Equipment used:  (transfer board) Transfers: Lateral/Scoot Transfers       Anterior-Posterior transfers: Mod assist;+2 physical assistance   General transfer comment: pt slid with pad under him over board, but assist to scoot back due to sliding anteriorly +2 for safety and technique  Ambulation/Gait                 Stairs            Wheelchair Mobility    Modified Rankin (Stroke  Patients Only)       Balance Overall balance assessment: Needs assistance Sitting-balance support: No upper extremity supported Sitting balance-Leahy Scale: Fair Sitting balance - Comments: sitting EOB with feet supported                                     Cognition Arousal/Alertness: Awake/alert Behavior During Therapy: WFL for tasks assessed/performed Overall Cognitive Status: Within Functional Limits for tasks assessed                                 General Comments: seems WFL, except pt did not recall RN bringing in meds during session. oriented otherwise      Exercises General Exercises - Lower Extremity Ankle Circles/Pumps: AROM;Left;10 reps;Supine Other Exercises Other Exercises: toe wiggles on R & more encouraged    General Comments General comments (skin integrity, edema, etc.): demonstrated transfer technique initially; Wayne Grimes present to interpret      Pertinent Vitals/Pain Faces Pain Scale: Hurts even more Pain Location: B LE Pain Descriptors / Indicators: Aching;Grimacing;Operative site guarding Pain Intervention(s): Monitored during session;Repositioned    Home Living                      Prior Function            PT Goals (current goals can now be found in the  care plan section) Progress towards PT goals: Progressing toward goals    Frequency    Min 5X/week      PT Plan Current plan remains appropriate    Co-evaluation              AM-PAC PT "6 Clicks" Daily Activity  Outcome Measure  Difficulty turning over in bed (including adjusting bedclothes, sheets and blankets)?: A Lot Difficulty moving from lying on back to sitting on the side of the bed? : Unable Difficulty sitting down on and standing up from a chair with arms (e.g., wheelchair, bedside commode, etc,.)?: Unable Help needed moving to and from a bed to chair (including a wheelchair)?: A Lot Help needed walking in hospital room?:  Total Help needed climbing 3-5 steps with a railing? : Total 6 Click Score: 8    End of Session Equipment Utilized During Treatment: Gait belt Activity Tolerance: Patient tolerated treatment well Patient left: in chair;with call bell/phone within reach Nurse Communication: Mobility status PT Visit Diagnosis: Muscle weakness (generalized) (M62.81);Pain;Other abnormalities of gait and mobility (R26.89) Pain - Right/Left: Right Pain - part of body: Leg     Time: 0092-3300 PT Time Calculation (min) (ACUTE ONLY): 25 min  Charges:  $Therapeutic Activity: 23-37 mins                    G CodesMagda Grimes, [place] [phone] 06/01/2017    Wayne Grimes 06/01/2017, 5:23 PM

## 2017-06-01 NOTE — Progress Notes (Signed)
Orthopedic Tech Progress Note Patient Details:  Wayne Grimes Sep 22, 1985 [id number]  Ortho Devices Type of Ortho Device: CAM walker   [name] 06/01/2017, 11:07 AM

## 2017-06-02 LAB — CBC
HEMATOCRIT: 29 % — AB (ref 39.0–52.0)
HEMOGLOBIN: 9.5 g/dL — AB (ref 13.0–17.0)
MCH: 30.4 pg (ref 26.0–34.0)
MCHC: 32.8 g/dL (ref 30.0–36.0)
MCV: 92.7 fL (ref 78.0–100.0)
Platelets: 137 10*3/uL — ABNORMAL LOW (ref 150–400)
RBC: 3.13 MIL/uL — ABNORMAL LOW (ref 4.22–5.81)
RDW: 15.7 % — ABNORMAL HIGH (ref 11.5–15.5)
WBC: 5.5 10*3/uL (ref 4.0–10.5)

## 2017-06-02 LAB — T4, FREE: Free T4: 1.19 ng/dL — ABNORMAL HIGH (ref 0.61–1.12)

## 2017-06-02 MED ORDER — HYDROMORPHONE HCL 2 MG PO TABS
2.0000 mg | ORAL_TABLET | ORAL | Status: DC | PRN
  Administered 2017-06-02 – 2017-06-03 (×4): 4 mg via ORAL
  Filled 2017-06-02 (×4): qty 2

## 2017-06-02 MED ORDER — ACETAMINOPHEN 500 MG PO TABS
1000.0000 mg | ORAL_TABLET | Freq: Four times a day (QID) | ORAL | Status: DC
  Administered 2017-06-02 – 2017-06-11 (×31): 1000 mg via ORAL
  Filled 2017-06-02 (×34): qty 2

## 2017-06-02 MED ORDER — POTASSIUM CHLORIDE IN NACL 20-0.9 MEQ/L-% IV SOLN
INTRAVENOUS | Status: DC
  Administered 2017-06-02 – 2017-06-05 (×4): via INTRAVENOUS
  Filled 2017-06-02 (×8): qty 1000

## 2017-06-02 NOTE — Progress Notes (Signed)
Patient states that injury occurred at work, [employer]. No worker's comp ID provided by patient at this time.

## 2017-06-02 NOTE — Progress Notes (Signed)
PROGRESS NOTE    Wayne Grimes  LPF:[id number] DOB: 08-31-1985 DOA: 05/27/2017 PCP: Patient, No Pcp Per   Specialists:   Oncology Dr. 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 Left upper extremity DVT 06/2012, right IJ DVT 08/17/2012 History of rectal and peritoneal abscess 01/10/2016 status post surgery Prior subdural hematoma  Status post fall right tibia shaft fracture + left tibial plateau fracture with right calcaneal fracture  recent diagnosis of pneumonia--seen and evaluated initially by trauma surgery  Found to have low O2 sats, CT Angie was done because of prior DVT history Found to have multilobar pneumonia Consulted by trauma/orthopedics regarding concurrent management of pneumonia/AL   Assessment & Plan:   Principal Problem:   Accidental fall from ladder Active Problems:   Displaced comminuted fracture of shaft of right tibia, initial encounter for closed fracture   Displaced fracture of body of right calcaneus, initial encounter for closed fracture   Closed fracture of lateral portion of left tibial plateau   History of DVT (deep vein thrombosis)   Pneumonia   Seems hemodynamically stable- --defer management of pain, wound care, disposition to orthopedics and postop therapy in terms of weightbearing status etc. -Suggest for simplicity sake starting PCA as pain control has been between 7 and 8/10 whenever I have seen him.  Might need further titration if going for further surgery  Sinus tach-etiology unclear Potentially related to pain  TSH ? nightly -s/p acute surgery-no further work-up  Schedule Xanax has not made much of a difference so doubt this is anxiety  Is not volume depleted  Will get US Dopplers of LE's to rule out DVT --polytrauma could pre-dispose--Dopplers grossly wnl-no AC need  Will start low-dose metoprolol 12.5 bi--increasing to 25 bid better  controlled--curbsided Dr. Burt Knack of Cardiology  recommends Echo--if neg, no further work-up--would de-escalate off beta blocker as op  History of ALL--currently managed at [facility] unremarkable with WBC 5.3 platelet 127 hemoglobin 9.7  Differential does not show any concerns  Dasatinib continued   cycle 5 of [place] scheduled 10/30 on hold as per his oncologist who I discussed with on 10/26  Recent diagnosis pneumonia about 10-14 days prior placed on Levaquin Acute hypoxic respiratory failure initially needing 3 L O2  Narrow Zosyn started 10/25 for multi lobar pneumonia----> 10/29 Doxy 100 bid--stop date 11/7  Pulmonary toilet, flutter valve, incentive spirometer    Please call me if q's--my partner will assume care and review chart and echo tomorrow-but if echo neg--we will sign off and relinquish care to Dr. Doreatha Martin   Lovenox Full code Inpatient Pending surgery and orthopedic input  Consultants:   Consulted by orthopedics  Procedures:   Various  Antimicrobials:   IV Zosyn   Subjective:  Fait no issue No sz No cp No n/v eating Looks more comfortable   Objective: Vitals:   06/01/17 1459 06/01/17 2218 06/02/17 0603 06/02/17 1300  BP: 112/71 107/64 112/63 116/62  Pulse: (!) 107 (!) 110 (!) 117 (!) 104  Resp: 16 18 18    Temp: (!) 97.5 F (36.4 C) 98.4 F (36.9 C) 98.4 F (36.9 C) 98.1 F (36.7 C)  TempSrc: Oral Oral Oral Oral  SpO2: 93% 93% 93% 97%  Weight:      Height:        Intake/Output Summary (Last 24 hours) at 06/02/17 1514 Last data filed at 06/02/17 1316  Gross per 24  hour  Intake              120 ml  Output              575 ml  Net             -455 ml   Filed Weights   05/27/17 1513  Weight: 101.6 kg (224 lb)    Examination:  eomi ncat cta b no added sounds no rales no rhonchi s1 s2 tachy less pronounced Thick neck No pallor no ict cta b abd soft nt nd no rebound Wounds bandaged --ones that are open well  sutured without deficit  Data Reviewed: I have personally reviewed following labs and imaging studies  CBC:  Recent Labs Lab 05/27/17 1959 05/27/17 2240 05/29/17 0429 05/29/17 0836 05/29/17 1012 05/29/17 1200 05/31/17 0914 06/02/17 0900  WBC 5.5 5.5 5.3  --   --  4.1 5.2 5.5  NEUTROABS 4.1  --  3.2  --   --   --   --   --   HGB 10.9* 10.6* 9.7* 7.5* 7.8* 10.0* 10.6* 9.5*  HCT 33.2* 32.7* 30.4* 22.0* 23.0* 29.6* 32.6* 29.0*  MCV 93.0 92.6 93.5  --   --  91.6 92.6 92.7  PLT 185 180 127*  --   --  104* 145* 333*   Basic Metabolic Panel:  Recent Labs Lab 05/27/17 1959 05/27/17 2240 05/29/17 0429 05/29/17 0836 05/29/17 1012 05/31/17 0914  NA 140  --  138 137 138 135  K 3.3*  --  3.5 3.6 3.7 3.4*  CL 108  --  103  --   --  97*  CO2 24  --  28  --   --  30  GLUCOSE 161*  --  117*  --   --  155*  BUN 12  --  5*  --   --  5*  CREATININE 0.75 0.69 0.69  --   --  0.62  CALCIUM 8.2*  --  8.1*  --   --  8.1*   GFR: Estimated Creatinine Clearance: 152 mL/min (by C-G formula based on SCr of 0.62 mg/dL). Liver Function Tests:  Recent Labs Lab 05/29/17 0429  AST 22  ALT 19  ALKPHOS 56  BILITOT 0.8  PROT 4.8*  ALBUMIN 2.8*   No results for input(s): LIPASE, AMYLASE in the last 168 hours. No results for input(s): AMMONIA in the last 168 hours. Coagulation Profile:  Recent Labs Lab 05/29/17 0429  INR 1.10   Cardiac Enzymes: No results for input(s): CKTOTAL, CKMB, CKMBINDEX, TROPONINI in the last 168 hours. BNP (last 3 results) No results for input(s): PROBNP in the last 8760 hours. HbA1C: No results for input(s): HGBA1C in the last 72 hours. CBG: No results for input(s): GLUCAP in the last 168 hours. Lipid Profile: No results for input(s): CHOL, HDL, LDLCALC, TRIG, CHOLHDL, LDLDIRECT in the last 72 hours. Thyroid Function Tests:  Recent Labs  05/31/17 0914 06/02/17 0918  TSH 5.654*  --   FREET4  --  1.19*   Anemia Panel: No results for input(s):  VITAMINB12, FOLATE, FERRITIN, TIBC, IRON, RETICCTPCT in the last 72 hours. Urine analysis: No results found for: COLORURINE, APPEARANCEUR, LABSPEC, [place], GLUCOSEU, [place], [place], [place], [place], [place], NITRITE, [place]   Radiology Studies: Reviewed images personally in health database    Scheduled Meds: . acetaminophen  1,000 mg Oral Q6H  . albuterol  2.5 mg Nebulization Once  . ALPRAZolam  1 mg Oral BID  .  dasatinib  70 mg Oral QHS  . doxycycline  100 mg Oral Q12H  . enoxaparin (LOVENOX) injection  40 mg Subcutaneous Q24H  . ketorolac  15 mg Intravenous Q6H  . methocarbamol  1,000 mg Oral QID  . metoprolol tartrate  25 mg Oral BID  . polyethylene glycol  17 g Oral Daily  . senna-docusate  1 tablet Oral QHS   Continuous Infusions: . sodium chloride    . 0.9 % NaCl with KCl 20 mEq / L 100 mL/hr at 06/02/17 1226  . methocarbamol (ROBAXIN)  IV       LOS: 6 days    Time spent: [place], MD [facility]   If 7PM-7AM, please contact night-coverage [email or web address] Password [email or web address] 06/02/2017, 3:14 PM

## 2017-06-02 NOTE — Progress Notes (Signed)
Called [employer]es [phone] roofing with patient's permission to see if worker's comp case open. Person who answered phone would not give me her name and said that there was no case open and hung up on me.

## 2017-06-02 NOTE — Progress Notes (Signed)
Physical Therapy Treatment Patient Details Name: Wayne Grimes MRN: [id number] DOB: Mar 23, 1986 Today's Date: 06/02/2017    History of Present Illness Pt is a 31 y.o. male who sustained a 12 foot fall from a ladder. 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 Recently diagnosed with community-aquired pneumonia. Pt with L tibial plateau fracture s/p ORIF and R tibial shaft fracture s/p IM nailing. Also sustained R calcaneus fracture.     PT Comments    Patient progressing to propelling w/c in hallway.  Still needing assist of 2 for lateral scoot transfers.  Unable to lift R LE on his own with pain.  Feel since no help at home and no ramp would need SNF level of care at d/c.   Follow Up Recommendations  SNF     Equipment Recommendations  Wheelchair (measurements PT);Other (comment);Wheelchair cushion (measurements PT) (slide board)    Recommendations for Other Services       Precautions / Restrictions Precautions Precautions: Fall;Knee Restrictions RLE Weight Bearing: Non weight bearing LLE Weight Bearing: Non weight bearing    Mobility  Bed Mobility Overal bed mobility: Needs Assistance Bed Mobility: Supine to Sit     Supine to sit: Mod assist;HOB elevated;+2 for physical assistance     General bed mobility comments: assist for leg support and for scooting forward on pad  Transfers Overall transfer level: Needs assistance Equipment used:  (slide board) Transfers: Lateral/Scoot Transfers          Lateral/Scoot Transfers: +2 physical assistance;Mod assist General transfer comment: from bed to w/c, then w/c to recliner with assist for sliding pad on board (pt able to lateral lean for board placement)  Ambulation/Gait                 Holiday representative Wheelchair mobility: Yes Wheelchair propulsion: Both upper extremities Wheelchair parts: Needs assistance Distance:  300 Wheelchair Assistance Details (indicate cue type and reason): cues for technique esp with turns, twice too close to wall and min A given for redirection  Modified Rankin (Stroke Patients Only)       Balance Overall balance assessment: Needs assistance Sitting-balance support: No upper extremity supported Sitting balance-Leahy Scale: Good Sitting balance - Comments: sitting EOB with feet supported        Standing balance comment: NWB bilateral LE                            Cognition Arousal/Alertness: Awake/alert Behavior During Therapy: WFL for tasks assessed/performed Overall Cognitive Status: Within Functional Limits for tasks assessed                                 General Comments: sleepy from meds      Exercises Total Joint Exercises Ankle Circles/Pumps: AROM;Left;10 reps;Supine Heel Slides: AAROM;Left;10 reps;Supine    General Comments General comments (skin integrity, edema, etc.): [name] present to interpret      Pertinent Vitals/Pain Pain Assessment: 0-10 Pain Score: 7  Pain Location: B LE R>L Pain Descriptors / Indicators: Aching;Grimacing;Operative site guarding Pain Intervention(s): Repositioned;Monitored during session;Premedicated before session    Home Living                      Prior Function            PT  Goals (current goals can now be found in the care plan section) Progress towards PT goals: Progressing toward goals    Frequency    Min 5X/week      PT Plan Current plan remains appropriate    Co-evaluation              AM-PAC PT "6 Clicks" Daily Activity  Outcome Measure  Difficulty turning over in bed (including adjusting bedclothes, sheets and blankets)?: A Lot Difficulty moving from lying on back to sitting on the side of the bed? : Unable Difficulty sitting down on and standing up from a chair with arms (e.g., wheelchair, bedside commode, etc,.)?: Unable Help needed moving to  and from a bed to chair (including a wheelchair)?: A Lot Help needed walking in hospital room?: Total Help needed climbing 3-5 steps with a railing? : Total 6 Click Score: 8    End of Session Equipment Utilized During Treatment: Other (comment) (cam boot on R LE) Activity Tolerance: Patient tolerated treatment well Patient left: in chair;with call bell/phone within reach Nurse Communication: Mobility status PT Visit Diagnosis: Muscle weakness (generalized) (M62.81);Pain;Other abnormalities of gait and mobility (R26.89) Pain - Right/Left: Right Pain - part of body: Leg     Time: 8309-4076 PT Time Calculation (min) (ACUTE ONLY): 27 min  Charges:  $Therapeutic Activity: 8-22 mins $Wheel Chair Management: 8-22 mins                    G CodesMagda Kiel, [place] [phone] 06/02/2017    Reginia Naas 06/02/2017, 10:59 AM

## 2017-06-02 NOTE — Progress Notes (Signed)
Orthopedic Trauma Service Progress Note   Patient ID: Wayne Grimes MRN: [id number] DOB/AGE: Sep 04, 1985 31 y.o.  Subjective:  Working with therapy this morning Right foot and ankle are too swollen to allow surgery tomorrow  Patient lives in a rental house.  Unable to outfit the dwelling to accommodate his needs.  He also does not have 24-hour assistance available to him.  Complains primarily of pain in his right heel and ankle  Doppler ultrasound bilateral lower extremities was negative for acute DVT   Review of Systems  Constitutional: Negative for chills and fever.  Respiratory: Negative for shortness of breath and wheezing.   Cardiovascular: Negative for chest pain and palpitations.  Gastrointestinal: Negative for nausea and vomiting.    Objective:   VITALS:   Vitals:   06/01/17 0447 06/01/17 1459 06/01/17 2218 06/02/17 0603  BP: 118/73 112/71 107/64 112/63  Pulse: (!) 126 (!) 107 (!) 110 (!) 117  Resp:  16 18 18   Temp: 99.3 F (37.4 C) (!) 97.5 F (36.4 C) 98.4 F (36.9 C) 98.4 F (36.9 C)  TempSrc: Oral Oral Oral Oral  SpO2: 95% 93% 93% 93%  Weight:      Height:        Estimated body mass index is 36.15 kg/m as calculated from the following:   Height as of this encounter: 5\' 6"  (1.676 m).   Weight as of this encounter: 101.6 kg (224 lb).   Intake/Output      10/29 0701 - 10/30 0700 10/30 0701 - 10/31 0700   P.O. 360    Total Intake(mL/kg) 360 (3.5)    Urine (mL/kg/hr) 475 (0.2)    Total Output 475     Net -115          Stool Occurrence  1 x     LABS  No results found for this or any previous visit (from the past 24 hour(s)).   PHYSICAL EXAM:   Gen: In bed.  Does appear to be uncomfortable and tired appearing Lungs: Breathing is unlabored Cardiac: Regular rate and rhythm, S1 and S2 Ext:       Right lower extremity  Incisions to proximal lower leg and knee are clean, dry and intact  No signs of infection  Cam  boot is fitting well  Distal motor and sensory functions are grossly intact  Extremities warm  Moderate swelling to the foot and ankle  Blistering anterior ankle  Skin does not wrinkle over the lateral heel/hindfoot       Left lower extremity  All wounds are stable  Motor and sensory functions are intact  Extremity is warm  Moderate swelling  No deep calf tenderness  + DP pulse  Compartments are soft and nontender, no pain with passive stretching  Assessment/Plan: 4 Days Post-Op   Principal Problem:   Accidental fall from ladder Active Problems:   Displaced comminuted fracture of shaft of right tibia, initial encounter for closed fracture   Displaced fracture of body of right calcaneus, initial encounter for closed fracture   Closed fracture of lateral portion of left tibial plateau   History of DVT (deep vein thrombosis)   Pneumonia   Anti-infectives    Start     Dose/Rate Route Frequency Ordered Stop   06/01/17 1400  doxycycline (VIBRA-TABS) tablet 100 mg     100 mg Oral Every 12 hours 06/01/17 1341     06/01/17 1345  levofloxacin (LEVAQUIN) tablet 500 mg  Status:  Discontinued     500  mg Oral Daily 06/01/17 1331 06/01/17 1341   05/29/17 1400  ceFAZolin (ANCEF) IVPB 2g/100 mL premix  Status:  Discontinued     2 g 200 mL/hr over 30 Minutes Intravenous Every 8 hours 05/29/17 1319 05/29/17 1352   05/29/17 1014  vancomycin (VANCOCIN) powder  Status:  Discontinued       As needed 05/29/17 1014 05/29/17 1054   05/29/17 0000  ceFAZolin (ANCEF) IVPB 2g/100 mL premix     2 g 200 mL/hr over 30 Minutes Intravenous To Surgery 05/28/17 1013 05/30/17 0000   05/28/17 2200  piperacillin-tazobactam (ZOSYN) IVPB 3.375 g  Status:  Discontinued     3.375 g 12.5 mL/hr over 240 Minutes Intravenous Every 8 hours 05/28/17 1522 06/01/17 1331   05/28/17 1600  piperacillin-tazobactam (ZOSYN) IVPB 3.375 g     3.375 g 100 mL/hr over 30 Minutes Intravenous  Once 05/28/17 1522 05/28/17 1745    05/28/17 1100  levofloxacin (LEVAQUIN) IVPB 750 mg  Status:  Discontinued     750 mg 100 mL/hr over 90 Minutes Intravenous Every 24 hours 05/28/17 0916 05/28/17 1445   05/28/17 1000  levofloxacin (LEVAQUIN) tablet 750 mg  Status:  Discontinued     750 mg Oral Daily 05/27/17 2154 05/28/17 0916    .  POD/HD#: 23  31 year old male status post fall with bilateral lower extremity fractures history complicated by active ALL  -Left lateral tibial plateau fracture s/p ORIF  Nonweightbearing x 6 weeks  Unrestricted range of motion left knee  Home exercise program including quad sets, straight leg raises, short arc quads, long arc quads, ankle Thera-Band program are permissible.  Please see sticky note in the chart  No resting with knee in flexion.  Place pillows under ankle or use zero knee bone foam to maintain full extension at rest    PT and OT   Ice and elevate  Will have TED hose placed in the left leg for swelling control  -Right tibial shaft fracture status post IM nail  Nonweightbearing times 8 weeks  Unrestricted knee range of motion, exercises as noted above  Ice and elevate  PT and OT  -Comminuted right calcaneus fracture  Nonweightbearing  Currently soft tissue swelling is too severe to allow for safe surgical intervention  We are hopeful to proceed with surgery later this week but this seems highly unlikely given the degree of swelling  We will placed the patient into another bulky compressive dressing to help with swelling control  Aggressive ice and elevation above heart  Encouraged toe motion to help with swelling resolution is well    Given patient's medical history and active medications he does pose a elevated risk for complications including infection and nonunion for this particular injury as well.  Strong consideration may be made for nonoperative treatment.  Patient may also self randomize himself to nonoperative treatment of his swelling does not resolve in an  expeditious manner as well   Continue with cam boot    There are several other significant social factors are present patient will require significant assistance given the bilateral nature of his injuries.  He will required essentially 24-hour assistance for ADLs.  He lives in a rental home and is unable to modify the indwelling for his other current needs.  Again he also does not have help.  He will require skilled nursing facility placement until he begins weightbearing  - Pain management:  Patient does appear to be uncomfortable this morning.  He is also somewhat somnolent  appearing  We will adjust his medications as follows   Discontinue Percocet   Discontinue OxyIR    Dilaudid 2 mg 1-2 p.o. every 4 hours as needed for pain   Tylenol 1000 mg p.o. every 6 hours scheduled   Robaxin 1000 mg p.o. every 6 hours scheduled   Dilaudid 1 mg IV every 2 hours as needed for severe breakthrough pain  - ABL anemia/Hemodynamics  Check CBC this morning  - Medical issues    Appreciate internal medicine's assistance with patient management   Sinus tach   Appears to be marginally better today   We will recheck his CBC   Pain medication adjustments have been made as noted above   Appears addition of metoprolol has made some effect    ?continuous EKG monitoring     Restart IVF       ALL   Will need to follow-up with [facility] oncology department after discharge   Home meds have been continued   Case was discussed by internal medicine with patient's oncologist 05/29/2017  - DVT/PE prophylaxis:  Lovenox  - ID:   Multi lobar pneumonia   Patient was treated with Levaquin prior to admission for pneumonia as well   Patient completed course of Zosyn is now on oral doxycycline   Continue aggressive incentive spirometry/pulmonary toilet  - Metabolic Bone Disease:  Labs are pending  Patient with a mildly elevated TSH: Could be reflective of acute postsurgical changes/physiologic  stress  - Activity:  Out of bed with assistance  Nonweightbearing bilaterally   - FEN/GI prophylaxis/Foley/Lines:  Regular diet  -Ex-fix/Splint care:  Okay to shower and clean wounds with soap and water  Remove all dressings and bracing   - Dispo:  Social work consult for skilled nursing center placement  Continue to titrate pain medications accordingly    Jari Pigg, PA-C [facility] [phone] (O) [phone] (C) 06/02/2017, 9:52 AM

## 2017-06-02 NOTE — Social Work (Signed)
CSW contacted financial counselor to make them aware of patient not having insurance.  CSW left message.  CSW will f/u as warranted.  Elissa Hefty, LCSW Clinical Social Worker [phone]

## 2017-06-03 ENCOUNTER — Inpatient Hospital Stay (HOSPITAL_COMMUNITY): Payer: Worker's Compensation

## 2017-06-03 DIAGNOSIS — S92011A Displaced fracture of body of right calcaneus, initial encounter for closed fracture: Secondary | ICD-10-CM

## 2017-06-03 DIAGNOSIS — R000 Tachycardia, unspecified: Secondary | ICD-10-CM

## 2017-06-03 DIAGNOSIS — Z86718 Personal history of other venous thrombosis and embolism: Secondary | ICD-10-CM

## 2017-06-03 DIAGNOSIS — S82251A Displaced comminuted fracture of shaft of right tibia, initial encounter for closed fracture: Principal | ICD-10-CM

## 2017-06-03 DIAGNOSIS — J189 Pneumonia, unspecified organism: Secondary | ICD-10-CM

## 2017-06-03 LAB — CALCITRIOL (1,25 DI-OH VIT D): VIT D 1 25 DIHYDROXY: 31.9 pg/mL (ref 19.9–79.3)

## 2017-06-03 LAB — COMPREHENSIVE METABOLIC PANEL
ALT: 21 U/L (ref 17–63)
AST: 29 U/L (ref 15–41)
Albumin: 2.5 g/dL — ABNORMAL LOW (ref 3.5–5.0)
Alkaline Phosphatase: 96 U/L (ref 38–126)
Anion gap: 10 (ref 5–15)
BILIRUBIN TOTAL: 0.7 mg/dL (ref 0.3–1.2)
BUN: 12 mg/dL (ref 6–20)
CO2: 24 mmol/L (ref 22–32)
Calcium: 8.4 mg/dL — ABNORMAL LOW (ref 8.9–10.3)
Chloride: 106 mmol/L (ref 101–111)
Creatinine, Ser: 0.63 mg/dL (ref 0.61–1.24)
GFR calc Af Amer: >60 mL/min (ref >60)
GLUCOSE: 121 mg/dL — AB (ref 65–99)
Potassium: 3.9 mmol/L (ref 3.5–5.1)
Sodium: 140 mmol/L (ref 135–145)
Total Protein: 5 g/dL — ABNORMAL LOW (ref 6.5–8.1)

## 2017-06-03 LAB — CBC
HEMATOCRIT: 28.8 % — AB (ref 39.0–52.0)
Hemoglobin: 9.1 g/dL — ABNORMAL LOW (ref 13.0–17.0)
MCH: 29.7 pg (ref 26.0–34.0)
MCHC: 31.6 g/dL (ref 30.0–36.0)
MCV: 94.1 fL (ref 78.0–100.0)
Platelets: 135 10*3/uL — ABNORMAL LOW (ref 150–400)
RBC: 3.06 MIL/uL — AB (ref 4.22–5.81)
RDW: 15.5 % (ref 11.5–15.5)
WBC: 3.7 10*3/uL — AB (ref 4.0–10.5)

## 2017-06-03 LAB — PTH, INTACT AND CALCIUM
Calcium, Total (PTH): 7.9 mg/dL — ABNORMAL LOW (ref 8.7–10.2)
PTH: 56 pg/mL (ref 15–65)

## 2017-06-03 LAB — ECHOCARDIOGRAM COMPLETE
Height: 66 in
Weight: 3584 oz

## 2017-06-03 LAB — VITAMIN D 25 HYDROXY (VIT D DEFICIENCY, FRACTURES): VIT D 25 HYDROXY: 17.9 ng/mL — AB (ref 30.0–100.0)

## 2017-06-03 LAB — T3, FREE: T3, Free: 3.2 pg/mL (ref 2.0–4.4)

## 2017-06-03 MED ORDER — AMOXICILLIN-POT CLAVULANATE 875-125 MG PO TABS: 1.0000 | ORAL_TABLET | Freq: Two times a day (BID) | ORAL | Status: DC

## 2017-06-03 MED ORDER — VITAMIN D 1000 UNITS PO TABS
2000.0000 [IU] | ORAL_TABLET | Freq: Two times a day (BID) | ORAL | Status: DC
Start: 2017-06-03 — End: 2017-06-11
  Administered 2017-06-03 – 2017-06-11 (×17): 2000 [IU] via ORAL
  Filled 2017-06-03 (×17): qty 2

## 2017-06-03 MED ORDER — CALCIUM CITRATE 950 (200 CA) MG PO TABS
200.0000 mg | ORAL_TABLET | Freq: Two times a day (BID) | ORAL | Status: DC
Start: 2017-06-03 — End: 2017-06-11
  Administered 2017-06-03 – 2017-06-11 (×17): 200 mg via ORAL
  Filled 2017-06-03 (×18): qty 1

## 2017-06-03 MED ORDER — HYDROMORPHONE HCL 2 MG PO TABS
4.0000 mg | ORAL_TABLET | ORAL | Status: DC | PRN
  Administered 2017-06-03 (×2): 4 mg via ORAL
  Administered 2017-06-04: 6 mg via ORAL
  Filled 2017-06-03: qty 2
  Filled 2017-06-03 (×2): qty 3

## 2017-06-03 MED ORDER — ALPRAZOLAM 0.5 MG PO TABS: 0.5000 mg | ORAL_TABLET | Freq: Two times a day (BID) | ORAL | Status: DC | PRN

## 2017-06-03 MED ORDER — AMOXICILLIN-POT CLAVULANATE 875-125 MG PO TABS
1.0000 | ORAL_TABLET | Freq: Two times a day (BID) | ORAL | Status: AC
  Administered 2017-06-03 – 2017-06-06 (×8): 1 via ORAL
  Filled 2017-06-03 (×9): qty 1

## 2017-06-03 NOTE — Progress Notes (Signed)
PROGRESS NOTE    Wayne Grimes  GHW:[id number] DOB: 06-20-1986 DOA: 05/27/2017 PCP: Patient, No Pcp Per   Brief Narrative: Wayne Grimes is a 31 y.o. male with a history of a LL.  He presented after a fall, suffering a right tibial shaft fracture and left tibial plateau fracture with right calcaneal fracture.  During his hospital course, he was found to have multilobar pneumonia concerning for aspiration.  He was started on antibiotics patient.  He is also found to have sinus tachycardia which is possibly secondary to acute pain versus possible underlying hyperthyroidism.  Beta blockers.   Assessment & Plan:   Principal Problem:   Accidental fall from ladder Active Problems:   Displaced comminuted fracture of shaft of right tibia, initial encounter for closed fracture   Displaced fracture of body of right calcaneus, initial encounter for closed fracture   Closed fracture of lateral portion of left tibial plateau   History of DVT (deep vein thrombosis)   Pneumonia   Tachycardia Elevated TSH and T4. Possible component of secondary hyperthyroidism, although patient currently in an acute stress state. Started on beta-blocker. Echocardiogram unremarkable except for some IVC dilation and grade 1 diastolic dysfunction -continue metoprolol -outpatient workup for hyperthyroidism  Pneumonia Multilobar. Possibly aspiration. MRSA negative. Was previously on Zosyn and transitioned to Doxycycline -discontinue Doxycycline -start Augmentin (end date 11/3) -continue incentive spirometer/flutter valve  History of ALL Outpatient management  Fracture of right tibia/calcaneous Fracture of left tibia -per primary  Elevated TSH Elevated T4 Possible hyperthyroidism vs acute stressful state.   DVT prophylaxis: Lovenox Code Status: Full code Family Communication: None at bedside Disposition Plan: Per primary   Antimicrobials:  Vancomycin (10/26)  Zosyn  (10/25>>10/29)  Levaquin (10/25)  Doxycycline (10/29>>10/31)  Augmentin (10/31>>    Subjective: Afebrile.  Objective: Vitals:   06/02/17 0603 06/02/17 1300 06/02/17 2209 06/03/17 0437  BP: 112/63 116/62 128/71 127/75  Pulse: (!) 117 (!) 104 (!) 116 (!) 102  Resp: 18  17 16   Temp: 98.4 F (36.9 C) 98.1 F (36.7 C) 98.6 F (37 C) 98.2 F (36.8 C)  TempSrc: Oral Oral Oral Oral  SpO2: 93% 97% 95% 97%  Weight:      Height:        Intake/Output Summary (Last 24 hours) at 06/03/17 1320 Last data filed at 06/03/17 0439  Gross per 24 hour  Intake                0 ml  Output              500 ml  Net             -500 ml   Filed Weights   05/27/17 1513  Weight: 101.6 kg (224 lb)    Examination:  General exam: Appears calm and comfortable HEENT: no thyromegaly Respiratory system: Clear to auscultation. Respiratory effort normal. Diminished breath sounds. Cardiovascular system: S1 & S2 heard, RRR. No murmurs, rubs, gallops or clicks. Gastrointestinal system: Abdomen is nondistended, soft and nontender. No organomegaly or masses felt. Normal bowel sounds heard. Central nervous system: Sleeping, but arouses to voice Extremities: No edema. No calf tenderness Skin: No cyanosis. No rashes Psychiatry: Judgement and insight appear normal. Mood & affect appropriate.     Data Reviewed: I have personally reviewed following labs and imaging studies  CBC:  Recent Labs Lab 05/27/17 1959  05/29/17 0429  05/29/17 1012 05/29/17 1200 05/31/17 0914 06/02/17 0900 06/03/17 0514  WBC 5.5  < > 5.3  --   --  4.1 5.2 5.5 3.7*  NEUTROABS 4.1  --  3.2  --   --   --   --   --   --   HGB 10.9*  < > 9.7*  < > 7.8* 10.0* 10.6* 9.5* 9.1*  HCT 33.2*  < > 30.4*  < > 23.0* 29.6* 32.6* 29.0* 28.8*  MCV 93.0  < > 93.5  --   --  91.6 92.6 92.7 94.1  PLT 185  < > 127*  --   --  104* 145* 137* 135*  < > = values in this interval not displayed. Basic Metabolic Panel:  Recent Labs Lab  05/27/17 1959 05/27/17 2240 05/29/17 0429 05/29/17 0836 05/29/17 1012 05/31/17 0914 06/02/17 0918 06/03/17 0514  NA 140  --  138 137 138 135  --  140  K 3.3*  --  3.5 3.6 3.7 3.4*  --  3.9  CL 108  --  103  --   --  97*  --  106  CO2 24  --  28  --   --  30  --  24  GLUCOSE 161*  --  117*  --   --  155*  --  121*  BUN 12  --  5*  --   --  5*  --  12  CREATININE 0.75 0.69 0.69  --   --  0.62  --  0.63  CALCIUM 8.2*  --  8.1*  --   --  8.1* 7.9* 8.4*   GFR: Estimated Creatinine Clearance: 152 mL/min (by C-G formula based on SCr of 0.63 mg/dL). Liver Function Tests:  Recent Labs Lab 05/29/17 0429 06/03/17 0514  AST 22 29  ALT 19 21  ALKPHOS 56 96  BILITOT 0.8 0.7  PROT 4.8* 5.0*  ALBUMIN 2.8* 2.5*   No results for input(s): LIPASE, AMYLASE in the last 168 hours. No results for input(s): AMMONIA in the last 168 hours. Coagulation Profile:  Recent Labs Lab 05/29/17 0429  INR 1.10   Cardiac Enzymes: No results for input(s): CKTOTAL, CKMB, CKMBINDEX, TROPONINI in the last 168 hours. BNP (last 3 results) No results for input(s): PROBNP in the last 8760 hours. HbA1C: No results for input(s): HGBA1C in the last 72 hours. CBG: No results for input(s): GLUCAP in the last 168 hours. Lipid Profile: No results for input(s): CHOL, HDL, LDLCALC, TRIG, CHOLHDL, LDLDIRECT in the last 72 hours. Thyroid Function Tests:  Recent Labs  06/02/17 0918  FREET4 1.19*  T3FREE 3.2   Anemia Panel: No results for input(s): VITAMINB12, FOLATE, FERRITIN, TIBC, IRON, RETICCTPCT in the last 72 hours. Sepsis Labs: No results for input(s): PROCALCITON, LATICACIDVEN in the last 168 hours.  Recent Results (from the past 240 hour(s))  Surgical pcr screen     Status: None   Collection Time: 05/27/17 11:14 PM  Result Value Ref Range Status   MRSA, PCR NEGATIVE NEGATIVE Final   Staphylococcus aureus NEGATIVE NEGATIVE Final    Comment: (NOTE) The Xpert SA Assay (FDA approved for NASAL  specimens in patients 52 years of age and older), is one component of a comprehensive surveillance program. It is not intended to diagnose infection nor to guide or monitor treatment.          Radiology Studies: No results found.      Scheduled Meds: . acetaminophen  1,000 mg Oral Q6H  . albuterol  2.5 mg Nebulization Once  . calcium citrate  200 mg of elemental calcium Oral BID  .  cholecalciferol  2,000 Units Oral BID  . dasatinib  70 mg Oral QHS  . doxycycline  100 mg Oral Q12H  . enoxaparin (LOVENOX) injection  40 mg Subcutaneous Q24H  . ketorolac  15 mg Intravenous Q6H  . methocarbamol  1,000 mg Oral QID  . metoprolol tartrate  25 mg Oral BID  . polyethylene glycol  17 g Oral Daily  . senna-docusate  1 tablet Oral QHS   Continuous Infusions: . sodium chloride    . 0.9 % NaCl with KCl 20 mEq / L 125 mL/hr at 06/03/17 0915  . methocarbamol (ROBAXIN)  IV       LOS: 7 days     Cordelia Poche, MD [facility] 06/03/2017, 1:20 PM Pager: [phone]  If 7PM-7AM, please contact night-coverage [email or web address] Password [email or web address] 06/03/2017, 1:20 PM

## 2017-06-03 NOTE — Progress Notes (Signed)
Orthopedic Trauma Service Progress Note   Patient ID: Wayne Grimes MRN: [id number] DOB/AGE: July 03, 1986 31 y.o.  Subjective:   Doing well this morning No specific complaints States his pain is better Patient only had 1 mg of IV Dilaudid yesterday morning.  He has been using p.o. Dilaudid otherwise and was going about 5 hours between doses.  Echo ordered for today  Heart rate does continue to trend down BP is stable, respiratory rate and O2 saturations are appropriate as well  Free T4 is mildly elevated, not sure if this is contributory to elevated heart because there is only a mild elevation.  T3 is within normal range  Review of Systems  Constitutional: Negative for chills and fever.  Respiratory: Negative for shortness of breath and wheezing.   Cardiovascular: Negative for chest pain and palpitations.  Gastrointestinal: Negative for nausea and vomiting.  Neurological: Negative for tingling and headaches.    Objective:   VITALS:   Vitals:   06/02/17 0603 06/02/17 1300 06/02/17 2209 06/03/17 0437  BP: 112/63 116/62 128/71 127/75  Pulse: (!) 117 (!) 104 (!) 116 (!) 102  Resp: 18  17 16   Temp: 98.4 F (36.9 C) 98.1 F (36.7 C) 98.6 F (37 C) 98.2 F (36.8 C)  TempSrc: Oral Oral Oral Oral  SpO2: 93% 97% 95% 97%  Weight:      Height:        Estimated body mass index is 36.15 kg/m as calculated from the following:   Height as of this encounter: 5\' 6"  (1.676 m).   Weight as of this encounter: 101.6 kg (224 lb).   Intake/Output      10/30 0701 - 10/31 0700 10/31 0701 - 11/01 0700   Urine (mL/kg/hr) 600 (0.2)    Total Output 600     Net -600          Stool Occurrence 3 x      LABS  Results for orders placed or performed during the hospital encounter of 05/27/17 (from the past 24 hour(s))  CBC     Status: Abnormal   Collection Time: 06/02/17  9:00 AM  Result Value Ref Range   WBC 5.5 4.0 - 10.5 K/uL   RBC 3.13 (L) 4.22 - 5.81  MIL/uL   Hemoglobin 9.5 (L) 13.0 - 17.0 g/dL   HCT 29.0 (L) 39.0 - 52.0 %   MCV 92.7 78.0 - 100.0 fL   MCH 30.4 26.0 - 34.0 pg   MCHC 32.8 30.0 - 36.0 g/dL   RDW 15.7 (H) 11.5 - 15.5 %   Platelets 137 (L) 150 - 400 K/uL  T4, free     Status: Abnormal   Collection Time: 06/02/17  9:18 AM  Result Value Ref Range   Free T4 1.19 (H) 0.61 - 1.12 ng/dL  T3, free     Status: None   Collection Time: 06/02/17  9:18 AM  Result Value Ref Range   T3, Free 3.2 2.0 - 4.4 pg/mL  VITAMIN D 25 Hydroxy (Vit-D Deficiency, Fractures)     Status: Abnormal   Collection Time: 06/02/17  9:18 AM  Result Value Ref Range   Vit D, 25-Hydroxy 17.9 (L) 30.0 - 100.0 ng/mL  CBC     Status: Abnormal   Collection Time: 06/03/17  5:14 AM  Result Value Ref Range   WBC 3.7 (L) 4.0 - 10.5 K/uL   RBC 3.06 (L) 4.22 - 5.81 MIL/uL   Hemoglobin 9.1 (L) 13.0 - 17.0 g/dL   HCT  28.8 (L) 39.0 - 52.0 %   MCV 94.1 78.0 - 100.0 fL   MCH 29.7 26.0 - 34.0 pg   MCHC 31.6 30.0 - 36.0 g/dL   RDW 15.5 11.5 - 15.5 %   Platelets 135 (L) 150 - 400 K/uL  Comprehensive metabolic panel     Status: Abnormal   Collection Time: 06/03/17  5:14 AM  Result Value Ref Range   Sodium 140 135 - 145 mmol/L   Potassium 3.9 3.5 - 5.1 mmol/L   Chloride 106 101 - 111 mmol/L   CO2 24 22 - 32 mmol/L   Glucose, Bld 121 (H) 65 - 99 mg/dL   BUN 12 6 - 20 mg/dL   Creatinine, Ser 0.63 0.61 - 1.24 mg/dL   Calcium 8.4 (L) 8.9 - 10.3 mg/dL   Total Protein 5.0 (L) 6.5 - 8.1 g/dL   Albumin 2.5 (L) 3.5 - 5.0 g/dL   AST 29 15 - 41 U/L   ALT 21 17 - 63 U/L   Alkaline Phosphatase 96 38 - 126 U/L   Total Bilirubin 0.7 0.3 - 1.2 mg/dL   GFR calc non Af Amer >60 >60 mL/min   GFR calc Af Amer >60 >60 mL/min   Anion gap 10 5 - 15     PHYSICAL EXAM:   Gen: In bed.  Does appear to be uncomfortable and tired appearing Lungs: Breathing is unlabored Cardiac: Regular rate and rhythm, S1 and S2 Ext:       Right lower extremity             Compressive wrap  fitting well.  Patient is tolerating.  Ca boot is offm              Distal motor and sensory functions are grossly intact             Extremities warm             Moderate swelling to the foot and ankle persists                    Left lower extremity             All wounds are stable             Motor and sensory functions are intact             Extremity is warm             Moderate swelling             No deep calf tenderness             + DP pulse             Compartments are soft and nontender, no pain with passive stretching  Assessment/Plan: 5 Days Post-Op   Principal Problem:   Accidental fall from ladder Active Problems:   Displaced comminuted fracture of shaft of right tibia, initial encounter for closed fracture   Displaced fracture of body of right calcaneus, initial encounter for closed fracture   Closed fracture of lateral portion of left tibial plateau   History of DVT (deep vein thrombosis)   Pneumonia   Anti-infectives    Start     Dose/Rate Route Frequency Ordered Stop   06/01/17 1400  doxycycline (VIBRA-TABS) tablet 100 mg     100 mg Oral Every 12 hours 06/01/17 1341     06/01/17 1345  levofloxacin (LEVAQUIN) tablet 500 mg  Status:  Discontinued     500 mg Oral Daily 06/01/17 1331 06/01/17 1341   05/29/17 1400  ceFAZolin (ANCEF) IVPB 2g/100 mL premix  Status:  Discontinued     2 g 200 mL/hr over 30 Minutes Intravenous Every 8 hours 05/29/17 1319 05/29/17 1352   05/29/17 1014  vancomycin (VANCOCIN) powder  Status:  Discontinued       As needed 05/29/17 1014 05/29/17 1054   05/29/17 0000  ceFAZolin (ANCEF) IVPB 2g/100 mL premix     2 g 200 mL/hr over 30 Minutes Intravenous To Surgery 05/28/17 1013 05/30/17 0000   05/28/17 2200  piperacillin-tazobactam (ZOSYN) IVPB 3.375 g  Status:  Discontinued     3.375 g 12.5 mL/hr over 240 Minutes Intravenous Every 8 hours 05/28/17 1522 06/01/17 1331   05/28/17 1600  piperacillin-tazobactam (ZOSYN) IVPB 3.375 g      3.375 g 100 mL/hr over 30 Minutes Intravenous  Once 05/28/17 1522 05/28/17 1745   05/28/17 1100  levofloxacin (LEVAQUIN) IVPB 750 mg  Status:  Discontinued     750 mg 100 mL/hr over 90 Minutes Intravenous Every 24 hours 05/28/17 0916 05/28/17 1445   05/28/17 1000  levofloxacin (LEVAQUIN) tablet 750 mg  Status:  Discontinued     750 mg Oral Daily 05/27/17 2154 05/28/17 0916    .  POD/HD#: 37  31 year old male status post fall with bilateral lower extremity fractures history complicated by active ALL   -Left lateral tibial plateau fracture s/p ORIF             Nonweightbearing x 6 weeks             Unrestricted range of motion left knee             Home exercise program including quad sets, straight leg raises, short arc quads, long arc quads, ankle Thera-Band program are permissible.  Please see sticky note in the chart             No resting with knee in flexion.  Place pillows under ankle or use zero knee bone foam to maintain full extension at rest                     PT and OT               Ice and elevate             TED hose   -Right tibial shaft fracture status post IM nail             Nonweightbearing times 8 weeks             Unrestricted knee range of motion, exercises as noted above             Ice and elevate             PT and OT   -Comminuted right calcaneus fracture             Nonweightbearing             Currently soft tissue swelling is too severe to allow for safe surgical intervention             We are hopeful to proceed with surgery later this week but this seems highly unlikely given the degree of swelling             bulky compressive dressing to help with swelling control  Aggressive ice and elevation above heart             Encouraged toe motion to help with swelling resolution is well                          Given patient's medical history and active medications he does pose a elevated risk for complications including infection and nonunion  for this particular injury as well.  Strong consideration may be made for nonoperative treatment.  Patient may also self randomize himself to nonoperative treatment of his swelling does not resolve in an expeditious manner as well               Continue with cam boot                 There are several other significant social factors are present patient will require significant assistance given the bilateral nature of his injuries.  He will required essentially 24-hour assistance for ADLs.  He lives in a rental home and is unable to modify the indwelling for his other current needs.  Again he also does not have help.  He will require skilled nursing facility placement until he begins weightbearing    - Pain management:             Current regimen does appear to be effective.  Will increase Dilaudid dose to 4-6 mg po q4h prn pain                           Dilaudid 2 mg 2-3 tabs p.o. every 4 hours as needed for pain                         Tylenol 1000 mg p.o. every 6 hours scheduled                         Robaxin 1000 mg p.o. every 6 hours scheduled                         Dilaudid 1 mg IV every 2 hours as needed for severe breakthrough pain   Have changed all benzos as needed orders   - ABL anemia/Hemodynamics             CBC is stable   - Medical issues                Appreciate internal medicine's assistance with patient management               Sinus tach                         Appears to continue to improve                         CBC is stable                         Pain medication adjustments have been made as noted above                        Echo is pending for today  Continue IV fluids                                       ALL                         Will need to follow-up with [facility] oncology department after discharge                         Home meds have been continued                         Case was discussed by internal medicine  with patient's oncologist 05/29/2017   - DVT/PE prophylaxis:             Lovenox   - ID:              Multi lobar pneumonia                         Patient was treated with Levaquin prior to admission for pneumonia as well                         Patient completed course of Zosyn is now on oral doxycycline                         Continue aggressive incentive spirometry/pulmonary toilet   - Metabolic Bone Disease:             Vitamin D deficiency             Patient wi mildly elevated TSH as well as minimally elevated T4.  Not sure this is contributory to the tachycardia   - Activity:             Out of bed with assistance             Nonweightbearing bilaterally              - FEN/GI prophylaxis/Foley/Lines:             Regular diet   -Ex-fix/Splint care:             Okay to shower and clean wounds with soap and water             Remove all dressings and bracing     - Dispo:             Social work consult for skilled nursing center placement              Continue to titrate pain medications accordingly       Jari Pigg, PA-C [facility] [phone] ([phone] (O) [phone] (C) 06/03/2017, 8:23 AM

## 2017-06-04 MED ORDER — HYDROMORPHONE HCL 2 MG PO TABS
2.0000 mg | ORAL_TABLET | ORAL | Status: DC | PRN
  Administered 2017-06-04 – 2017-06-11 (×26): 4 mg via ORAL
  Filled 2017-06-04 (×8): qty 2
  Filled 2017-06-04 (×2): qty 1
  Filled 2017-06-04 (×13): qty 2
  Filled 2017-06-04 (×2): qty 1
  Filled 2017-06-04 (×3): qty 2

## 2017-06-04 NOTE — Final Consult Note (Addendum)
   Tachycardia improved. Echocardiogram no significant for structural changes contributing to tachycardia. Likely related to pain but TSH and T4 elevated. Pneumonia is multifocal and concerning for aspiration. Augmentin to continue antibiotic course.  Recommendations:  Outpatient follow-up for repeat TSH in non-acute setting  Outpatient follow-up for titrating down beta-blocker  Continue Augmentin 875-125mg  BID (End date 06/06/17) for pneumonia  Outpatient follow-up for Grade 1 diastolic heart dysfunction  Will sign off. Please call/re-consult with any further questions. Thanks!   Cordelia Poche, MD [facility] 06/04/2017, 12:16 PM Pager: [phone]  If 7PM-7AM, please contact night-coverage [email or web address] Password [email or web address] 06/04/2017, 12:16 PM

## 2017-06-04 NOTE — Progress Notes (Signed)
Physical Therapy Treatment Patient Details Name: Wayne Grimes MRN: [id number] DOB: 1986-05-16 Today's Date: 06/04/2017    History of Present Illness Pt is a 31 y.o. male who sustained a 12 foot fall from a ladder. 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 Recently diagnosed with community-aquired pneumonia. Pt with L tibial plateau fracture s/p ORIF and R tibial shaft fracture s/p IM nailing. Also sustained R calcaneus fracture.     PT Comments    Patient progressing with transfers and w/c mobility this session.  Noted order regarding HEP after tx session today.  Will plan to instruct next session.  Continues to need SNF level rehab.  PT to follow.    Follow Up Recommendations  SNF     Equipment Recommendations  Wheelchair (measurements PT);Other (comment);Wheelchair cushion (measurements PT)    Recommendations for Other Services       Precautions / Restrictions Precautions Precautions: Fall;Knee Restrictions Weight Bearing Restrictions: Yes RLE Weight Bearing: Non weight bearing LLE Weight Bearing: Non weight bearing    Mobility  Bed Mobility Overal bed mobility: Needs Assistance Bed Mobility: Supine to Sit     Supine to sit: HOB elevated;Min assist     General bed mobility comments: supine to long sitting with HOB elevated, assist for R LE  Transfers Overall transfer level: Needs assistance   Transfers: Anterior-Posterior Transfer;Lateral/Scoot Transfers       Anterior-Posterior transfers: Mod assist;+2 safety/equipment  Lateral/Scoot Transfers: Min assist;+2 safety/equipment General transfer comment: able to transfer into w/c with A-P technique with assist for R LE and to guide hips over transition bed to chair; to recliner from w/c lateral scoot with transfer board and min A or 2 for safety, guiding pad over board, chair stability, placing board and cues to keep weight off feet  Ambulation/Gait                  Holiday representative Wheelchair mobility: Yes Wheelchair propulsion: Both upper extremities Wheelchair parts: Needs assistance Distance: 300 Wheelchair Assistance Details (indicate cue type and reason): 2 rest breaks, S for propulsion  Modified Rankin (Stroke Patients Only)       Balance Overall balance assessment: Needs assistance   Sitting balance-Leahy Scale: Good         Standing balance comment: NWB bilateral LE                            Cognition Arousal/Alertness: Awake/alert Behavior During Therapy: WFL for tasks assessed/performed Overall Cognitive Status: Within Functional Limits for tasks assessed                                        Exercises Other Exercises Other Exercises: educated on how to assist moving R LE with blanket or sheet under foot and using hands to help    General Comments General comments (skin integrity, edema, etc.): [name] present to interpret      Pertinent Vitals/Pain Faces Pain Scale: Hurts even more Pain Location: B LE R>L Pain Descriptors / Indicators: Aching;Grimacing;Operative site guarding Pain Intervention(s): Monitored during session;Repositioned    Home Living                      Prior Function  PT Goals (current goals can now be found in the care plan section) Progress towards PT goals: Progressing toward goals    Frequency    Min 3X/week      PT Plan Current plan remains appropriate;Frequency needs to be updated    Co-evaluation              AM-PAC PT "6 Clicks" Daily Activity  Outcome Measure  Difficulty turning over in bed (including adjusting bedclothes, sheets and blankets)?: A Lot Difficulty moving from lying on back to sitting on the side of the bed? : Unable Difficulty sitting down on and standing up from a chair with arms (e.g., wheelchair, bedside commode, etc,.)?: Unable Help needed  moving to and from a bed to chair (including a wheelchair)?: A Little Help needed walking in hospital room?: Total Help needed climbing 3-5 steps with a railing? : Total 6 Click Score: 9    End of Session Equipment Utilized During Treatment: Other (comment) (cam boot) Activity Tolerance: Patient tolerated treatment well Patient left: in chair;with call bell/phone within reach   PT Visit Diagnosis: Muscle weakness (generalized) (M62.81);Pain;Other abnormalities of gait and mobility (R26.89) Pain - Right/Left: Right Pain - part of body: Leg     Time: 1594-5859 PT Time Calculation (min) (ACUTE ONLY): 29 min  Charges:  $Therapeutic Activity: 8-22 mins $Wheel Chair Management: 8-22 mins                    G CodesMagda Kiel, [place] [phone] 06/04/2017    Reginia Naas 06/04/2017, 10:51 AM

## 2017-06-04 NOTE — Clinical Social Work Note (Addendum)
Clinical Social Work Assessment  Patient Details  Name: Wayne Grimes MRN: [id number] Date of Birth: 12/02/1985  Date of referral:  05/05/17               Reason for consult:  Facility Placement                Permission sought to share information with:    Permission granted to share information::  Yes, Verbal Permission Granted  Name::        Agency::  SNF  Relationship::     Contact Information:     Housing/Transportation Living arrangements for the past 2 months:  Apartment Source of Information:  Patient, Other (Comment Required) Licensed conveyancer) Patient Interpreter Needed:  Spanish Criminal Activity/Legal Involvement Pertinent to Current Situation/Hospitalization:  No - Comment as needed Significant Relationships:  Other Family Members Lives with:  Self Do you feel safe going back to the place where you live?  No Need for family participation in patient care:  No (Coment)  Care giving concerns:  Pt had an work related injury. CSW met with with patient with interpreter and he indicated that He was working for [employer] and they will provide coverage. CSW provided information to [facility] as the patient has no contact information for employer and phone number for supervisor.   Pt is undocumented, has no insurance, resides with self, no family here in [place]. Pt will need LOG or DTP for rehab therapy.  Social Worker assessment / plan:  Pt has no insurance and will need LOG/DTP. CSW will f/u for placement.  Employment status:  [employer] information:  Self Pay (Medicaid Pending) PT Recommendations:  [place] / Referral to community resources:  [place]  Patient/Family's Response to care:  Pt appreciative of CSW assistance. No issues or concerns.  Patient/Family's Understanding of and Emotional Response to Diagnosis, Current Treatment, and Prognosis:  Pt has good understanding of diagnosis, current treatment and prognosis.  Pt f/u on LOG/DTP bed.  Emotional Assessment Appearance:  Appears stated age Attitude/Demeanor/Rapport:   (Cooperative) Affect (typically observed):  Accepting, Appropriate Orientation:  Oriented to Situation, Oriented to  Time, Oriented to Place, Oriented to Self Alcohol / Substance use:  Not Applicable Psych involvement (Current and /or in the community):  No (Comment)  Discharge Needs  Concerns to be addressed:  Care Coordination Readmission within the last 30 days:  No Current discharge risk:  Physical Impairment, Cognitively Impaired, Inadequate Financial Supports, Lack of support system Barriers to Discharge:  Inadequate or no insurance   Normajean Baxter, LCSW 06/04/2017, 12:28 PM

## 2017-06-04 NOTE — Progress Notes (Signed)
Orthopedic Trauma Service Progress Note   Patient ID: Wayne Grimes MRN: [id number] DOB/AGE: 04/08/1986 31 y.o.  Subjective:  No acute issues Resting comfortably in bed, patient is actually sleeping  Yesterday patient received a total of 8 mg of oral Dilaudid in addition to his scheduled non-opioid medications  Participating with PT and OT  Echo was performed yesterday, notable for some IVC dilation grade 1 diastolic dysfunction otherwise no findings.  ROS As above  Objective:   VITALS:   Vitals:   06/03/17 0437 06/03/17 1300 06/03/17 2153 06/04/17 0617  BP: 127/75 131/74 124/73 133/81  Pulse: (!) 102 (!) 104 (!) 102 100  Resp: 16  16 15   Temp: 98.2 F (36.8 C) 98.6 F (37 C) 98.2 F (36.8 C) 98 F (36.7 C)  TempSrc: Oral Oral Oral Oral  SpO2: 97% 97% 96% 95%  Weight:      Height:        Estimated body mass index is 36.15 kg/m as calculated from the following:   Height as of this encounter: 5\' 6"  (1.676 m).   Weight as of this encounter: 101.6 kg (224 lb).   Intake/Output      10/31 0701 - 11/01 0700 11/01 0701 - 11/02 0700   Urine (mL/kg/hr) 800 (0.3)    Total Output 800     Net -800            LABS  No results found for this or any previous visit (from the past 24 hour(s)).   PHYSICAL EXAM:   Gen: In bed.    Sleeping Lungs: Breathing is unlabored Cardiac:  Regular Ext:       Right lower extremity             Incisions to proximal lower leg and knee are clean, dry and intact             No signs of infection excellent knee range of motion             Distal motor and sensory functions are grossly intact             Extremities warm             Moderate swelling to the foot and ankle             Blistering anterior ankle             Skin does not wrinkle over the lateral heel/hindfoot  Excellent knee range of motion        Left lower extremity             All wounds are stable             Motor and sensory  functions are intact             Extremity is warm             Moderate swelling             No deep calf tenderness             + DP pulse             Compartments are soft and nontender, no pain with passive stretching  Excellent knee range of motion   Assessment/Plan: 6 Days Post-Op   Principal Problem:   Accidental fall from ladder Active Problems:   Displaced comminuted fracture of shaft of right tibia, initial encounter for closed fracture   Displaced fracture of body of  right calcaneus, initial encounter for closed fracture   Closed fracture of lateral portion of left tibial plateau   History of DVT (deep vein thrombosis)   Pneumonia   Anti-infectives    Start     Dose/Rate Route Frequency Ordered Stop   06/03/17 1415  amoxicillin-clavulanate (AUGMENTIN) 875-125 MG per tablet 1 tablet     1 tablet Oral Every 12 hours 06/03/17 1342 06/07/17 0959   06/03/17 1345  amoxicillin-clavulanate (AUGMENTIN) 875-125 MG per tablet 1 tablet  Status:  Discontinued     1 tablet Oral Every 12 hours 06/03/17 1335 06/03/17 1342   06/01/17 1400  doxycycline (VIBRA-TABS) tablet 100 mg  Status:  Discontinued     100 mg Oral Every 12 hours 06/01/17 1341 06/03/17 1335   06/01/17 1345  levofloxacin (LEVAQUIN) tablet 500 mg  Status:  Discontinued     500 mg Oral Daily 06/01/17 1331 06/01/17 1341   05/29/17 1400  ceFAZolin (ANCEF) IVPB 2g/100 mL premix  Status:  Discontinued     2 g 200 mL/hr over 30 Minutes Intravenous Every 8 hours 05/29/17 1319 05/29/17 1352   05/29/17 1014  vancomycin (VANCOCIN) powder  Status:  Discontinued       As needed 05/29/17 1014 05/29/17 1054   05/29/17 0000  ceFAZolin (ANCEF) IVPB 2g/100 mL premix     2 g 200 mL/hr over 30 Minutes Intravenous To Surgery 05/28/17 1013 05/30/17 0000   05/28/17 2200  piperacillin-tazobactam (ZOSYN) IVPB 3.375 g  Status:  Discontinued     3.375 g 12.5 mL/hr over 240 Minutes Intravenous Every 8 hours 05/28/17 1522 06/01/17 1331    05/28/17 1600  piperacillin-tazobactam (ZOSYN) IVPB 3.375 g     3.375 g 100 mL/hr over 30 Minutes Intravenous  Once 05/28/17 1522 05/28/17 1745   05/28/17 1100  levofloxacin (LEVAQUIN) IVPB 750 mg  Status:  Discontinued     750 mg 100 mL/hr over 90 Minutes Intravenous Every 24 hours 05/28/17 0916 05/28/17 1445   05/28/17 1000  levofloxacin (LEVAQUIN) tablet 750 mg  Status:  Discontinued     750 mg Oral Daily 05/27/17 2154 05/28/17 0916    .  POD/HD#: 31  31 year old male status post fall with bilateral lower extremity fractures history complicated by active ALL   -Left lateral tibial plateau fracture s/p ORIF             Nonweightbearing x 6 weeks             Unrestricted range of motion left knee             Home exercise program including quad sets, straight leg raises, short arc quads, long arc quads, ankle Thera-Band program are permissible.  Please see sticky note in the chart             No resting with knee in flexion.  Place pillows under ankle or use zero knee bone foam to maintain full extension at rest                     PT and OT               Ice and elevate             TED hose   -Right tibial shaft fracture status post IM nail             Nonweightbearing times 8 weeks              Unrestricted  knee range of motion, exercises as noted above             Ice and elevate             PT and OT   -Comminuted right calcaneus fracture             Nonweightbearing             Soft tissue swelling still precluding safe surgical intervention                        bulky compressive dressing to help with swelling control             Aggressive ice and elevation above heart             Encouraged toe motion to help with swelling resolution is well      - Pain management:             Current regimen does appear to be effective.  Will increase Dilaudid dose to 4-6 mg po q4h prn pain                           Dilaudid 2 mg 1-2 tabs p.o. every 4 hours as needed for pain                          Tylenol 1000 mg p.o. every 6 hours scheduled                         Robaxin 1000 mg p.o. every 6 hours scheduled                         Dilaudid 1 mg IV every 2 hours as needed for severe breakthrough pain  - ABL anemia/Hemodynamics             CBC is stable   - Medical issues                Appreciate internal medicine's assistance with patient management               Sinus tach                         Appears to continue to improve                         CBC is stable                         Pain medication adjustments have been made as noted above                        Echo notable for some IVC dilation grade 1 diastolic dysfunction    thyroid function possibly component of hypothyroidism.  Outpatient workup                 ALL                         Will need to [facility] with [facility] after discharge  Home meds have been continued                         Case was discussed by internal medicine with patient's oncologist 05/29/2017   - DVT/PE prophylaxis:             Lovenox   - ID:              Multi lobar pneumonia                         Patient was treated with Levaquin prior to admission for pneumonia as well                         Patient completed course of Zosyn, was transitioned to doxycycline and is now transitioned to Augmentin until 06/06/2017                         Continue aggressive incentive spirometry/pulmonary toilet   - Metabolic Bone Disease:             Vitamin D deficiency             Patient wi mildly elevated TSH as well as minimally elevated T4.  Not sure this is contributory to the tachycardia.  As above   - Activity:             Out of bed with assistance             Nonweightbearing bilaterally   - FEN/GI prophylaxis/Foley/Lines:             Regular diet   -Ex-fix/Splint care:             Okay to shower and clean wounds with soap and water             Remove all  dressings and bracing     - Dispo:             Social work consult for skilled nursing center placement              Awaiting SNF placement      Jari Pigg, PA-C [facility] [phone] ([phone] Levi Aland (C) 06/04/2017, 9:19 AM

## 2017-06-04 NOTE — Care Management (Signed)
Case manager attempted to reach patient's employer- Wayne Grimes. Phone was answered by a Earline Mayotte who was very rude and abrasive,says she is the Glass blower/designer,  states that they haven't filed claim with workers comp and then hung up.  CM has left phone number on repeated messages.

## 2017-06-04 NOTE — Care Management (Signed)
Case manager called [employer] [phone], left message requesting return call.

## 2017-06-05 ENCOUNTER — Encounter (HOSPITAL_COMMUNITY)
Admission: EM | Disposition: A | Discharge status: Other Acute Inpatient Hospital | Payer: Self-pay | Source: Home / Self Care | Attending: Student

## 2017-06-05 LAB — CBC
HCT: 29.7 % — ABNORMAL LOW (ref 39.0–52.0)
HEMOGLOBIN: 9.5 g/dL — AB (ref 13.0–17.0)
MCH: 29.8 pg (ref 26.0–34.0)
MCHC: 32 g/dL (ref 30.0–36.0)
MCV: 93.1 fL (ref 78.0–100.0)
PLATELETS: 145 10*3/uL — AB (ref 150–400)
RBC: 3.19 MIL/uL — AB (ref 4.22–5.81)
RDW: 15.5 % (ref 11.5–15.5)
WBC: 4.3 10*3/uL (ref 4.0–10.5)

## 2017-06-05 SURGERY — BILATERAL OPEN REDUCTION INTERNAL FIXATION (ORIF) CALCANEUS
Anesthesia: General | Laterality: Right

## 2017-06-05 NOTE — Progress Notes (Signed)
Physical Therapy Treatment Patient Details Name: Wayne Grimes MRN: [id number] DOB: 18-Nov-1985 Today's Date: 06/05/2017    History of Present Illness Pt is a 31 y.o. male who sustained a 12 foot fall from a ladder. 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 Recently diagnosed with community-aquired pneumonia. Pt with L tibial plateau fracture s/p ORIF and R tibial shaft fracture s/p IM nailing. Also sustained R calcaneus fracture.     PT Comments    Patient progressing with LE strength/ROM and activity tolerance during this session focused on HEP (issued in Espanol) and LE therex.  Patient able to demonstrate only isometric and eccentric quads on R, but progressed during session.  Pain limited ROM efforts on R with blanket and pt unable to actively extend the leg.  Continue to feel he will need SNF level of care at d/c.  Will continue efforts for HEP (with added goal,) and transfers/wheelchair mobility.   Follow Up Recommendations  SNF     Equipment Recommendations  Wheelchair (measurements PT);Other (comment);Wheelchair cushion (measurements PT)    Recommendations for Other Services       Precautions / Restrictions Precautions Precautions: Fall;Knee Restrictions RLE Weight Bearing: Non weight bearing LLE Weight Bearing: Non weight bearing    Mobility  Bed Mobility               General bed mobility comments: in bed for HEP instruction  Transfers                    Ambulation/Gait                 Stairs            Wheelchair Mobility    Modified Rankin (Stroke Patients Only)       Balance                                            Cognition Arousal/Alertness: Awake/alert Behavior During Therapy: WFL for tasks assessed/performed Overall Cognitive Status: Within Functional Limits for tasks assessed                                        Exercises General  Exercises - Lower Extremity Quad Sets: AROM;Both;10 reps;Supine Short Arc Quad: AAROM;Right;5 reps;Supine Heel Slides: AAROM;Both;5 reps;Supine (self assist with towel/blanket) Straight Leg Raises: AAROM;AROM;Both;10 reps;Supine Other Exercises Other Exercises: toe AROM/AAROM R foot in supine x 10    General Comments General comments (skin integrity, edema, etc.): used video interpreter       Pertinent Vitals/Pain Faces Pain Scale: Hurts even more Pain Location: R knee Pain Descriptors / Indicators: Grimacing;Guarding Pain Intervention(s): Monitored during session;Repositioned    Home Living                      Prior Function            PT Goals (current goals can now be found in the care plan section) Progress towards PT goals: Progressing toward goals    Frequency    Min 3X/week      PT Plan Current plan remains appropriate    Co-evaluation              AM-PAC PT "6 Clicks" Daily Activity  Outcome Measure  Difficulty turning over in bed (including adjusting bedclothes, sheets and blankets)?: A Lot Difficulty moving from lying on back to sitting on the side of the bed? : Unable Difficulty sitting down on and standing up from a chair with arms (e.g., wheelchair, bedside commode, etc,.)?: Unable Help needed moving to and from a bed to chair (including a wheelchair)?: A Little Help needed walking in hospital room?: Total Help needed climbing 3-5 steps with a railing? : Total 6 Click Score: 9    End of Session   Activity Tolerance: Patient tolerated treatment well Patient left: in bed;with call bell/phone within reach   PT Visit Diagnosis: Muscle weakness (generalized) (M62.81);Pain;Other abnormalities of gait and mobility (R26.89) Pain - Right/Left: Right Pain - part of body: Leg     Time: 4650-3546 PT Time Calculation (min) (ACUTE ONLY): 32 min  Charges:  $Therapeutic Exercise: 23-37 mins                    G CodesMagda Kiel,  [place] [phone] 06/05/2017    Reginia Naas 06/05/2017, 3:44 PM

## 2017-06-05 NOTE — Progress Notes (Signed)
Orthopedic Tech Progress Note Patient Details:  Wayne Grimes 1986/04/23 [id number]  Ortho Devices Type of Ortho Device:  (footsie roll) Ortho Device/Splint Location: lle Ortho Device/Splint Interventions: Application   Rosalind Guido 06/05/2017, 11:35 AM

## 2017-06-05 NOTE — Patient Instructions (Signed)
Strengthening: Straight Leg Raise (Phase 1)    Contraiga el msculo anterior del muslo derecho, eleve la pierna _15___ cm. de la superficie de apoyo, manteniendo la rodilla extendida. Repita _10___ veces por rutina. Realice __6__ Wayne Grimes por sesin. Realice _6___ sesiones por da.  [email or web address]   Copyright  VHI. All rights reserved.  Quad Set: Slight Flexion    Contraiga los msculos de la parte anterior del muslo izquierdo. Sostenga _5___ segundos. Repita ___10_ veces por rutina. Realice __5__ Wayne Grimes por sesin. Realice __9__ sesiones por da.  [email or web address]   Copyright  VHI. All rights reserved.  Hamstring Curl: Resisted (Prone)    Con el punto de agarre atrs suyo, y [place] banda elstica alrededor del tobillo izquierdo, doble la rodilla partiendo de la posicin extendida. Repita _10___ veces por rutina. Realice _9___ Wayne Grimes por sesin. Realice _3___ sesiones por da.  [email or web address]   Copyright  VHI. All rights reserved.  Knee Extension Mobilization: Towel Prop    Con una toalla enrollada debajo del tobillo derecho, coloque una pesa de _1___ libras sobre la rodilla. Sostenga __10__ minutos. Repita __1__ veces por rutina. Realice __5__ Wayne Grimes por sesin. Realice ___7_ sesiones por da.  [email or web address]://[email or web address]   Copyright  VHI. All rights reserved.  Hip Abduction: Modified    Sobre el lado derecho con [employer] piernas, eleve la pierna de arriba rotndola ligeramente hacia afuera. Repita __10__ veces por rutina. Realice __0__ Wayne Grimes por sesin. Realice __1__ sesiones por da.  [email or web address]://[email or web address]   Copyright  VHI. All rights reserved.   Strengthening: Quadriceps Set    Contraiga el msculo en la parte anterior del muslo presionando las rodillas hacia la superficie de apoyo. Sostenga _5___ segundos. Repita _10___ veces por rutina. Realice __7__ Wayne Grimes por sesin. Realice __7__ sesiones por  da.  [email or web address]://[email or web address]   Copyright  VHI. All rights reserved.  Strengthening: Hip Extension (Prone)    Contraiga los msculos de la parte anterior del muslo izquierdo, eleve la pierna _15___ cm. de la superficie de apoyo, manteniendo la rodilla extendida. Repita __10__ veces por rutina. Realice __9__ Wayne Grimes por sesin. Realice __3__ sesiones por da.  [email or web address]://[email or web address]   Copyright  VHI. All rights reserved.  Knee Extension: Resisted (Sitting)    Con una banda elstica alrededor del tobillo derecho y debajo del otro pie, eleve la pierna hasta extender la rodilla. Mantenga la otra pierna doblada para aumentar la resistencia. Repita __10__ veces por rutina. Realice _9___ Wayne Grimes por sesin. Realice __0__ sesiones por da.  [email or web address]://[email or web address]   Copyright  VHI. All rights reserved.  Knee Extension Mobilization: Towel Prop    Con una toalla enrollada debajo del tobillo derecho, coloque una pesa de _2___ Darletta Moll la rodilla. Sostenga __10__ minutos. Repita _1___ veces por rutina. Realice __3__ Wayne Grimes por sesin. Realice __0__ sesiones por da.  [email or web address]   Copyright  VHI. All rights reserved.  PROM: Knee Flexion    Con una toalla alrededor del tobillo izquierdo, suavemente tire de la toalla hacia arriba hasta sentir un estiramiento. Sostenga __20__ segundos. Repita __5__ veces por rutina. Realice __0__ Wayne Grimes por sesin. Realice __9__ sesiones por da.  [email or web address]   Copyright  VHI. All rights reserved.

## 2017-06-05 NOTE — Progress Notes (Signed)
Occupational Therapy Treatment Patient Details Name: Wayne Grimes MRN: [id number] DOB: [birth year] Today's Date: 06/05/2017    History of present illness Pt is a 31 y.o. male who sustained a 12 foot fall from a ladder. 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 Recently diagnosed with community-aquired pneumonia. Pt with L tibial plateau fracture s/p ORIF and R tibial shaft fracture s/p IM nailing. Also sustained R calcaneus fracture.    OT comments  Pt. Motivated and eager to participate in skilled OT.  Able complete ub/lb bathing eob with grooming tasks.  Asking about PT also.  Excellent patient!  Follow Up Recommendations       Equipment Recommendations  3 in 1 bedside commode    Recommendations for Other Services      Precautions / Restrictions Precautions Precautions: Fall;Knee Restrictions RLE Weight Bearing: Non weight bearing LLE Weight Bearing: Non weight bearing       Mobility Bed Mobility Overal bed mobility: Needs Assistance Bed Mobility: Supine to Sit     Supine to sit: HOB elevated;Min assist Sit to supine: Min assist   General bed mobility comments: supine to long sitting with HOB elevated, assist for R LE to bring to eob, pt. able to use bed rails to pull with b ues as therapist asst. used pad to pull pt. to hob at end of sesssion once back in bed  Transfers                      Balance                                           ADL either performed or assessed with clinical judgement   ADL Overall ADL's : Needs assistance/impaired     Grooming: Wash/dry hands;Wash/dry face;Oral care;Applying deodorant;Set up;Sitting Grooming Details (indicate cue type and reason): eob Upper Body Bathing: Set up;Sitting Upper Body Bathing Details (indicate cue type and reason): eob Lower Body Bathing: Set up;Sitting/lateral leans Lower Body Bathing Details (indicate cue type and reason): eob Upper  Body Dressing : Minimal assistance;Sitting                           Vision       Perception     Praxis      Cognition Arousal/Alertness: Awake/alert Behavior During Therapy: WFL for tasks assessed/performed Overall Cognitive Status: Within Functional Limits for tasks assessed                                          Exercises     Shoulder Instructions       General Comments      Pertinent Vitals/ Pain       Pain Assessment: No/denies pain (denies pain but grimaced and guarded with movement of RLE) Pain Descriptors / Indicators: Grimacing;Guarding  Home Living                                          Prior Functioning/Environment              Frequency  Min 3X/week  Progress Toward Goals  OT Goals(current goals can now be found in the care plan section)  Progress towards OT goals: Progressing toward goals     Plan Discharge plan remains appropriate    Co-evaluation                 AM-PAC PT "6 Clicks" Daily Activity     Outcome Measure   Help from another person eating meals?: None Help from another person taking care of personal grooming?: A Little Help from another person toileting, which includes using toliet, bedpan, or urinal?: A Lot Help from another person bathing (including washing, rinsing, drying)?: A Lot Help from another person to put on and taking off regular upper body clothing?: A Little Help from another person to put on and taking off regular lower body clothing?: A Lot 6 Click Score: 16    End of Session Equipment Utilized During Treatment: Other (comment) (utilized strava ipad for communitcation and translating between pt. and therapist asst. )  OT Visit Diagnosis: Other abnormalities of gait and mobility (R26.89);Pain Pain - part of body: Leg   Activity Tolerance Patient tolerated treatment well   Patient Left in bed;with call bell/phone within reach   Nurse  Communication          Time: 4081-4481 OT Time Calculation (min): 26 min  Charges: OT General Charges $OT Visit: 1 Visit OT Treatments $Self Care/Home Management : 23-37 mins   Janice Coffin, COTA/L 06/05/2017, 10:58 AM

## 2017-06-05 NOTE — Progress Notes (Signed)
Orthopedic Trauma Service Progress Note   Patient ID: Cage Gupton MRN: [id number] DOB/AGE: 29/29/1987 29 y.o.  Subjective:  No acute changes Doing okay  Service ongoing for skilled nursing center  Appreciate internal medicine assistance.  They have signed off at this point as he is stable recommendations noted in Dr. Lisbeth Ply last progress note  Patient will need outpatient follow-up of his thyroid function tests for underlying hyperthyroidism.  However I can check his thyroid function tests when I recheck his vitamin D levels and if his T4 remains elevated can then refer to endocrine  He will also need outpatient follow-up to wean off of his metoprolol as well as follow-up for his grade 1 diastolic dysfunction noted on echo  Augmentin will end tomorrow for his pneumonia  Review of patient's medications over the last 24 hours show him receiving Dilaudid 4 mg p.o. every 6 hours along with his scheduled medications.  It does appear that his pain is controlled at this time.  ROS  Objective:   VITALS:   Vitals:   06/04/17 0949 06/04/17 1528 06/04/17 2100 06/05/17 0450  BP: 130/83 117/73 138/81 131/72  Pulse: (!) 106 93 (!) 103 93  Resp: 16 16 16 16   Temp: 98.1 F (36.7 C) (!) 97.5 F (36.4 C) 98.6 F (37 C) 98.3 F (36.8 C)  TempSrc: Oral Oral Oral Oral  SpO2: 95% 97% 97% 94%  Weight:      Height:        Estimated body mass index is 36.15 kg/m as calculated from the following:   Height as of this encounter: 5\' 6"  (1.676 m).   Weight as of this encounter: 101.6 kg (224 lb).   Intake/Output      11/01 0701 - 11/02 0700 11/02 0701 - 11/03 0700   P.O. 600    I.V. (mL/kg) 6175.4 (60.8)    Total Intake(mL/kg) 6775.4 (66.7)    Urine (mL/kg/hr) 1400 (0.6)    Total Output 1400     Net +5375.4            LABS  Results for orders placed or performed during the hospital encounter of 05/27/17 (from the past 24 hour(s))  CBC     Status:  Abnormal   Collection Time: 06/05/17  3:40 AM  Result Value Ref Range   WBC 4.3 4.0 - 10.5 K/uL   RBC 3.19 (L) 4.22 - 5.81 MIL/uL   Hemoglobin 9.5 (L) 13.0 - 17.0 g/dL   HCT 29.7 (L) 39.0 - 52.0 %   MCV 93.1 78.0 - 100.0 fL   MCH 29.8 26.0 - 34.0 pg   MCHC 32.0 30.0 - 36.0 g/dL   RDW 15.5 11.5 - 15.5 %   Platelets 145 (L) 150 - 400 K/uL     PHYSICAL EXAM:   Gen: Appears comfortable, awake and alert Lungs: Clear anterior fields Cardiac: Regular, S1 and S2 Abd:+ Bowel sounds, nontender Ext:       Right lower extremity  Bulky dressing to the right ankle intact and in good condition  Incision right knee and proximal lower leg are stable, no signs of infection, no drainage  Patient able to perform active knee extension and flexion.  He can perform straight leg raise  Distal motor and sensory functions are grossly intact  Extremity is warm      Left lower extremity     All wounds are stable             Motor and sensory functions are  intact  Patient can perform active knee flexion extension as well as perform quad set and straight leg raise without difficulty.             Extremity is warm             Moderate swelling             No deep calf tenderness             + DP pulse             Compartments are soft and nontender, no pain with passive stretching             Excellent knee range of motion    Assessment/Plan: 7 Days Post-Op   Principal Problem:   Accidental fall from ladder Active Problems:   Displaced comminuted fracture of shaft of right tibia, initial encounter for closed fracture   Displaced fracture of body of right calcaneus, initial encounter for closed fracture   Closed fracture of lateral portion of left tibial plateau   History of DVT (deep vein thrombosis)   Pneumonia   Anti-infectives    Start     Dose/Rate Route Frequency Ordered Stop   06/03/17 1415  amoxicillin-clavulanate (AUGMENTIN) 875-125 MG per tablet 1 tablet     1 tablet Oral Every 12  hours 06/03/17 1342 06/07/17 0959   06/03/17 1345  amoxicillin-clavulanate (AUGMENTIN) 875-125 MG per tablet 1 tablet  Status:  Discontinued     1 tablet Oral Every 12 hours 06/03/17 1335 06/03/17 1342   06/01/17 1400  doxycycline (VIBRA-TABS) tablet 100 mg  Status:  Discontinued     100 mg Oral Every 12 hours 06/01/17 1341 06/03/17 1335   06/01/17 1345  levofloxacin (LEVAQUIN) tablet 500 mg  Status:  Discontinued     500 mg Oral Daily 06/01/17 1331 06/01/17 1341   05/29/17 1400  ceFAZolin (ANCEF) IVPB 2g/100 mL premix  Status:  Discontinued     2 g 200 mL/hr over 30 Minutes Intravenous Every 8 hours 05/29/17 1319 05/29/17 1352   05/29/17 1014  vancomycin (VANCOCIN) powder  Status:  Discontinued       As needed 05/29/17 1014 05/29/17 1054   05/29/17 0000  ceFAZolin (ANCEF) IVPB 2g/100 mL premix     2 g 200 mL/hr over 30 Minutes Intravenous To Surgery 05/28/17 1013 05/30/17 0000   05/28/17 2200  piperacillin-tazobactam (ZOSYN) IVPB 3.375 g  Status:  Discontinued     3.375 g 12.5 mL/hr over 240 Minutes Intravenous Every 8 hours 05/28/17 1522 06/01/17 1331   05/28/17 1600  piperacillin-tazobactam (ZOSYN) IVPB 3.375 g     3.375 g 100 mL/hr over 30 Minutes Intravenous  Once 05/28/17 1522 05/28/17 1745   05/28/17 1100  levofloxacin (LEVAQUIN) IVPB 750 mg  Status:  Discontinued     750 mg 100 mL/hr over 90 Minutes Intravenous Every 24 hours 05/28/17 0916 05/28/17 1445   05/28/17 1000  levofloxacin (LEVAQUIN) tablet 750 mg  Status:  Discontinued     750 mg Oral Daily 05/27/17 2154 05/28/17 0916    .  POD/HD#: 84  31 year old male status post fall with bilateral lower extremity fractures history complicated by active ALL   -Left lateral tibial plateau fracture s/p ORIF             Nonweightbearing x 6 weeks             Unrestricted range of motion left knee  Home exercise program including quad sets, straight leg raises, short arc quads, long arc quads, ankle Thera-Band program  are permissible.  Please see sticky note in the chart             No resting with knee in flexion.  Place pillows under ankle or use zero knee bone foam to maintain full extension at rest                     PT and OT               Ice and elevate             TED hose   -Right tibial shaft fracture status post IM nail             Nonweightbearing times 8 weeks              Unrestricted knee range of motion, exercises as noted above  Okay to perform ankle range of motion as well.  Encourage patient to wiggle toes to help with swelling             Ice and elevate             PT and OT   -Comminuted right calcaneus fracture             Nonweightbearing             Soft tissue swelling still precluding safe surgical intervention               bulky compressive dressing to help with swelling control             Aggressive ice and elevation above heart             Encouraged toe motion to help with swelling resolution is well       - Pain management:             Current regimen does appear to be effective.    Will eliminate IV Dilaudid at this time                         Dilaudid 2 mg 1-2 tabs p.o. every 4 hours as needed for pain                         Tylenol 1000 mg p.o. every 6 hours scheduled                         Robaxin 1000 mg p.o. every 6 hours scheduled                  - ABL anemia/Hemodynamics             CBC is stable  Platelets are stable   - Medical issues                Appreciate internal medicine's assistance with patient management               Sinus tach                         Appears to continue to improve, current heart rate in the 90s                         CBC is stable  Echo notable for some IVC dilation grade 1 diastolic dysfunction                          thyroid function possibly component of hyperthyroidism.  Outpatient workup                 ALL                         Will need to follow-up  with [facility] oncology department after discharge                         Home meds have been continued                         Case was discussed by internal medicine with patient's oncologist 05/29/2017   - DVT/PE prophylaxis:             Lovenox   - ID:              Multi lobar pneumonia                         Patient was treated with Levaquin prior to admission for pneumonia as well                         Patient completed course of Zosyn, was transitioned to doxycycline and is now transitioned to Augmentin until 06/06/2017                         Continue aggressive incentive spirometry/pulmonary toilet   - Metabolic Bone Disease:             Vitamin D deficiency             Patient wi mildly elevated TSH as well as minimally elevated T4.  Not sure this is contributory to the tachycardia.  As above   - Activity:             Out of bed with assistance             Nonweightbearing bilaterally   - FEN/GI prophylaxis/Foley/Lines:             Regular diet   -Ex-fix/Splint care:             Okay to shower and clean wounds with soap and water             Remove all dressings and bracing     - Dispo:             Social work consult for skilled nursing center placement              Awaiting SNF placement   Continue with current      Jari Pigg, PA-C [facility] [phone] ([phone] Levi Aland (C) 06/05/2017, 8:31 AM

## 2017-06-06 LAB — HEPATIC FUNCTION PANEL
ALT: 23 U/L (ref 17–63)
AST: 27 U/L (ref 15–41)
Albumin: 2.9 g/dL — ABNORMAL LOW (ref 3.5–5.0)
Alkaline Phosphatase: 85 U/L (ref 38–126)
BILIRUBIN TOTAL: 0.7 mg/dL (ref 0.3–1.2)
Total Protein: 5.2 g/dL — ABNORMAL LOW (ref 6.5–8.1)

## 2017-06-06 NOTE — Progress Notes (Signed)
Subjective: 8 Days Post-Op Procedure(s) (LRB): RIGHT TIBIA INTRAMEDULLARY (IM) NAIL (Right) OPEN REDUCTION INTERNAL FIXATION (ORIF) TIBIAL PLATEAU (Left) Patient reports pain as mild.    Objective: Vital signs in last 24 hours: Temp:  [98.4 F (36.9 C)-98.7 F (37.1 C)] 98.4 F (36.9 C) (11/03 0643) Pulse Rate:  [100-109] 101 (11/03 0643) BP: (122-132)/(70-87) 124/70 (11/03 0643) SpO2:  [95 %-98 %] 95 % (11/03 0643)  Intake/Output from previous day: 11/02 0701 - 11/03 0700 In: -  Out: 2630 [Urine:2630] Intake/Output this shift: No intake/output data recorded.   Recent Labs  06/05/17 0340  HGB 9.5*    Recent Labs  06/05/17 0340  WBC 4.3  RBC 3.19*  HCT 29.7*  PLT 145*   No results for input(s): NA, K, CL, CO2, BUN, CREATININE, GLUCOSE, CALCIUM in the last 72 hours. No results for input(s): LABPT, INR in the last 72 hours.  Neurovascular intact Sensation intact distally Incision: dressing C/D/I  Assessment/Plan: 8 Days Post-Op Procedure(s) (LRB): RIGHT TIBIA INTRAMEDULLARY (IM) NAIL (Right) OPEN REDUCTION INTERNAL FIXATION (ORIF) TIBIAL PLATEAU (Left)  -Left lateral tibial plateau fracture s/p ORIF Nonweightbearing x6 weeks Unrestricted range of motion left knee Home exercise program including quad sets, straight leg raises, short arc quads, long arc quads, ankle Thera-Band program are permissible. Please see sticky note in the chart No resting with knee in flexion. Place pillows under ankle or use zero knee bone foam to maintain full extension at rest  PT and OT  Ice and elevate TED hose  -Right tibial shaft fracture status post IM nail Nonweightbearing times 8 weeks Unrestricted knee range of motion, exercises as noted above             Okay to perform ankle range of motion as well.  Encourage patient to wiggle toes to help with  swelling Ice and elevate PT and OT  -Comminuted right calcaneus fracture Nonweightbearing Soft tissue swelling still precluding safe surgical intervention  bulky compressive dressing to help with swelling control Aggressive ice and elevation above heart Encouraged toe motion to help with swelling resolution is well    - Pain management: Current regimen does appear to be effective.   Will eliminate IV Dilaudid at this time Dilaudid 2 mg 1-2tabsp.o. every 4 hours as needed for pain Tylenol 1000 mg p.o. every 6 hours scheduled Robaxin 1000 mg p.o. every 6 hours scheduled   - ABL anemia/Hemodynamics CBC is stable             Platelets are stable  - Medical issues   Appreciate internal medicine's assistance with patient management  Sinus tach Appears to continue to improve, current heart rate in the 90s CBC is stable  Echo notable for some IVC dilation grade 1 diastolic dysfunction thyroid function possibly component of hyperthyroidism. Outpatient workup   ALL Will need to follow-up with [facility] oncology department after discharge Home meds have been continued Case was discussed by internal medicine with patient's oncologist 05/29/2017  - DVT/PE prophylaxis: Lovenox  - ID:  Multi lobar pneumonia Patient was treated with Levaquin prior to admission for pneumonia as well Patient completed course of Zosyn, was transitioned to doxycycline and is now transitioned to Augmentin until  06/06/2017 Continue aggressive incentive spirometry/pulmonary toilet  - Metabolic Bone Disease: Vitamin D deficiency Patient wimildly elevated TSH as well as minimally elevated T4. Not sure this is contributory to the tachycardia. As above  - Activity: Out of bed with assistance Nonweightbearing bilaterally  - FEN/GI prophylaxis/Foley/Lines: Regular diet  -  Ex-fix/Splint care: Okay to shower and clean wounds with soap and water Remove all dressings and bracing   - Dispo: Social work consult for skilled nursing center placement Awaiting SNF placement             Continue with current  Chriss Czar 06/06/2017, 8:49 AM

## 2017-06-07 NOTE — Progress Notes (Signed)
Subjective: 9 Days Post-Op Procedure(s) (LRB): RIGHT TIBIA INTRAMEDULLARY (IM) NAIL (Right) OPEN REDUCTION INTERNAL FIXATION (ORIF) TIBIAL PLATEAU (Left) Patient reports pain as mild.  Denies dizziness/lightheadedness.  Still awaiting SNF.  Objective: Vital signs in last 24 hours: Temp:  [98.2 F (36.8 C)-98.7 F (37.1 C)] 98.3 F (36.8 C) (11/04 0432) Pulse Rate:  [85-105] 105 (11/04 0432) Resp:  [14-16] 14 (11/04 0432) BP: (117-127)/(74-87) 127/78 (11/04 0432) SpO2:  [96 %-97 %] 97 % (11/04 0432)  Intake/Output from previous day: 11/03 0701 - 11/04 0700 In: -  Out: 5597 [Urine:1220] Intake/Output this shift: No intake/output data recorded.  Recent Labs    06/05/17 0340  HGB 9.5*   Recent Labs    06/05/17 0340  WBC 4.3  RBC 3.19*  HCT 29.7*  PLT 145*   No results for input(s): NA, K, CL, CO2, BUN, CREATININE, GLUCOSE, CALCIUM in the last 72 hours. No results for input(s): LABPT, INR in the last 72 hours.  Neurovascular intact Sensation intact distally Incision: dressing C/D/I  Assessment/Plan: 9 Days Post-Op Procedure(s) (LRB): RIGHT TIBIA INTRAMEDULLARY (IM) NAIL (Right) OPEN REDUCTION INTERNAL FIXATION (ORIF) TIBIAL PLATEAU (Left)  -Left lateral tibial plateau fracture s/p ORIF Nonweightbearing x6 weeks Unrestricted range of motion left knee Home exercise program including quad sets, straight leg raises, short arc quads, long arc quads, ankle Thera-Band program are permissible. Please see sticky note in the chart No resting with knee in flexion. Place pillows under ankle or use zero knee bone foam to maintain full extension at rest  PT and OT  Ice and elevate TED hose  -Right tibial shaft fracture status post IM nail Nonweightbearing times 8 weeks Unrestricted knee range of motion, exercises as noted above Okay to perform  ankle range of motion as well. Encourage patient to wiggle toes to help with swelling Ice and elevate PT and OT  -Comminuted right calcaneus fracture Nonweightbearing Soft tissue swelling still precluding safe surgical intervention  bulky compressive dressing to help with swelling control Aggressive ice and elevation above heart Encouraged toe motion to help with swelling resolution is well  ALL Will need to follow-up with [facility] oncology department after discharge Home meds have been continued Case was discussed by internal medicine with patient's oncologist 05/29/2017  - DVT/PE prophylaxis: Lovenox  - Activity: Out of bed with assistance Nonweightbearing bilaterally  - FEN/GI prophylaxis/Foley/Lines: Regular diet  -Ex-fix/Splint care: Okay to shower and clean wounds with soap and water Remove all dressings and bracing  - Dispo: Social work consult for skilled nursing center placement Awaiting SNF placement Continue with current    Chriss Czar 06/07/2017, 9:26 AM

## 2017-06-07 NOTE — Plan of Care (Signed)
  Safety: Ability to remain free from injury will improve 06/07/2017 1106 - Progressing by Williams Che, RN    Pain Managment: General experience of comfort will improve 06/07/2017 1106 - Progressing by Williams Che, RN   Physical Regulation: Ability to maintain clinical measurements within normal limits will improve 06/07/2017 1106 - Progressing by Williams Che, RN   Activity: Risk for activity intolerance will decrease 06/07/2017 1106 - Progressing by Williams Che, RN   Nutrition: Adequate nutrition will be maintained 06/07/2017 1106 - Progressing by Williams Che, RN

## 2017-06-08 LAB — CBC WITH DIFFERENTIAL/PLATELET
BASOS PCT: 0 %
Basophils Absolute: 0 10*3/uL (ref 0.0–0.1)
EOS ABS: 0.1 10*3/uL (ref 0.0–0.7)
Eosinophils Relative: 3 %
HCT: 35 % — ABNORMAL LOW (ref 39.0–52.0)
Hemoglobin: 11 g/dL — ABNORMAL LOW (ref 13.0–17.0)
Lymphocytes Relative: 52 %
Lymphs Abs: 2.6 10*3/uL (ref 0.7–4.0)
MCH: 29.2 pg (ref 26.0–34.0)
MCHC: 31.4 g/dL (ref 30.0–36.0)
MCV: 92.8 fL (ref 78.0–100.0)
MONO ABS: 0.5 10*3/uL (ref 0.1–1.0)
Monocytes Relative: 11 %
NEUTROS ABS: 1.7 10*3/uL (ref 1.7–7.7)
NEUTROS PCT: 34 %
PLATELETS: 192 10*3/uL (ref 150–400)
RBC: 3.77 MIL/uL — ABNORMAL LOW (ref 4.22–5.81)
RDW: 16.6 % — AB (ref 11.5–15.5)
WBC: 4.9 10*3/uL (ref 4.0–10.5)

## 2017-06-08 NOTE — Progress Notes (Signed)
Orthopaedic Trauma Progress Note  S: Doing well. No issues over weekend.  O:  Vitals:   06/08/17 0504 06/08/17 1453  BP: 121/77 117/76  Pulse:  99  Resp:  18  Temp: 98.9 F (37.2 C) 98.3 F (36.8 C)  SpO2: 94% 95%  LLE: incisions clean dry and intact. Compartments soft and compressible. Neurovascularly intact distally. Able to bend and straighten leg.  RLE: Improved swelling over lateral aspect of calacneus. There is a pressure sore over the anterior ankle still with some blistering.Compartments soft and compressible. Neuro intact. Brisk cap refill.  A/P: 31 year old male s/p IMN right tibia shaft fracture and ORIF left tibial plateau also with Sanders 2 right intra-articular calcaneus fracture  NWB BLE PT/OT Awaiting placement Potentially ORIF if swelling improves during hospitalization  Shona Needles, MD [facility] [phone] (phone)

## 2017-06-08 NOTE — Progress Notes (Addendum)
Occupational Therapy Treatment Patient Details Name: Wayne Grimes MRN: [id number] DOB: [birth year] Today's Date: 06/08/2017    History of present illness Pt is a 31 y.o. male who sustained a 12 foot fall from a ladder. 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 Recently diagnosed with community-aquired pneumonia. Pt with L tibial plateau fracture s/p ORIF and R tibial shaft fracture s/p IM nailing. Also sustained R calcaneus fracture.    OT comments  Pt making progress with functional goals. Pt up in recliner completing bath upon arrival. CIR admission had been denied and now recommending SNF for further therapy needs after acute d/c. OT to continue to follow acutely  Follow Up Recommendations  SNF;Supervision/Assistance - 24 hour    Equipment Recommendations  3 in 1 bedside commode    Recommendations for Other Services      Precautions / Restrictions Precautions Precautions: Fall;Knee Restrictions Weight Bearing Restrictions: Yes RLE Weight Bearing: Non weight bearing LLE Weight Bearing: Non weight bearing       Mobility Bed Mobility Overal bed mobility: Needs Assistance Bed Mobility: Supine to Sit   Sidelying to sit: Min assist;HOB elevated Supine to sit: HOB elevated;Min assist     General bed mobility comments: pt up in recliner completing bath upon arrival  Transfers Overall transfer level: Needs assistance   Transfers: Anterior-Posterior Transfer           General transfer comment: pt ready to eat lunch at end of session and will remain in recliner    Balance Overall balance assessment: Needs assistance Sitting-balance support: No upper extremity supported Sitting balance-Leahy Scale: Good                                     ADL either performed or assessed with clinical judgement   ADL Overall ADL's : Needs assistance/impaired     Grooming: Wash/dry hands;Wash/dry face;Oral care;Applying  deodorant;Set up;Sitting           Upper Body Dressing : Sitting;Set up;Supervision/safety Upper Body Dressing Details (indicate cue type and reason): up in recliner after CNA set up bath                   General ADL Comments: verbalized use of sock aid and reacher for LB dressing tasks     Vision Patient Visual Report: No change from baseline     Perception     Praxis      Cognition Arousal/Alertness: Awake/alert Behavior During Therapy: WFL for tasks assessed/performed Overall Cognitive Status: Within Functional Limits for tasks assessed                                          Exercises    Shoulder Instructions       General Comments  pt pleasant and cooperative    Pertinent Vitals/ Pain       Pain Assessment: 0-10 Pain Score: 2  Pain Location: R knee Pain Descriptors / Indicators: Sore Pain Intervention(s): Monitored during session  Home Living                                          Prior Functioning/Environment  Frequency  Min 3X/week        Progress Toward Goals  OT Goals(current goals can now be found in the care plan section)  Progress towards OT goals: Progressing toward goals     Plan Discharge plan needs to be updated    Co-evaluation                 AM-PAC PT "6 Clicks" Daily Activity     Outcome Measure   Help from another person eating meals?: None Help from another person taking care of personal grooming?: A Little Help from another person toileting, which includes using toliet, bedpan, or urinal?: A Lot Help from another person bathing (including washing, rinsing, drying)?: A Lot Help from another person to put on and taking off regular upper body clothing?: A Little Help from another person to put on and taking off regular lower body clothing?: A Lot 6 Click Score: 16    End of Session    OT Visit Diagnosis: Other abnormalities of gait and mobility  (R26.89);Pain Pain - part of body: Leg   Activity Tolerance Patient tolerated treatment well   Patient Left with call bell/phone within reach;in chair   Nurse Communication      Functional Assessment Tool Used: AM-PAC 6 Clicks Daily Activity   Time: 0347-4259 OT Time Calculation (min): 20 min  Charges: OT G-codes **NOT FOR INPATIENT CLASS** Functional Assessment Tool Used: AM-PAC 6 Clicks Daily Activity OT General Charges $OT Visit: 1 Visit OT Treatments $Self Care/Home Management : 8-22 mins     Britt Bottom 06/08/2017, 3:10 PM

## 2017-06-08 NOTE — Progress Notes (Signed)
Physical Therapy Treatment Patient Details Name: Wayne Grimes MRN: [id number] DOB: [birth year] Today's Date: 06/08/2017    History of Present Illness Pt is a 31 y.o. male who sustained a 12 foot fall from a ladder. 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 Recently diagnosed with community-aquired pneumonia. Pt with L tibial plateau fracture s/p ORIF and R tibial shaft fracture s/p IM nailing. Also sustained R calcaneus fracture.     PT Comments    Patient progressing with transfers and strength and ROM with LE therex.  Continues to need skilled PT in the acute setting to progress to goals.  SNF level rehab recommended on d/c.   Follow Up Recommendations  SNF     Equipment Recommendations  Wheelchair (measurements PT);Other (comment);Wheelchair cushion (measurements PT)    Recommendations for Other Services       Precautions / Restrictions Precautions Precautions: Fall;Knee Restrictions RLE Weight Bearing: Non weight bearing LLE Weight Bearing: Non weight bearing    Mobility  Bed Mobility Overal bed mobility: Needs Assistance Bed Mobility: Supine to Sit   Sidelying to sit: Min assist;HOB elevated Supine to sit: HOB elevated;Min assist     General bed mobility comments: supine to long sitting  Transfers Overall transfer level: Needs assistance   Transfers: Anterior-Posterior Transfer          Lateral/Scoot Transfers: Min assist;+2 safety/equipment General transfer comment: assist for R LE and to scoot back into chair with pad, pt using UE's and assist for safety  Ambulation/Gait                 Stairs            Wheelchair Mobility    Modified Rankin (Stroke Patients Only)       Balance                                            Cognition Arousal/Alertness: Awake/alert Behavior During Therapy: WFL for tasks assessed/performed Overall Cognitive Status: Within Functional Limits  for tasks assessed                                        Exercises General Exercises - Lower Extremity Ankle Circles/Pumps: AROM;Left;Seated Short Arc Quad: Right;10 reps;Seated Long Arc Quad: AROM;Left;10 reps;Seated Heel Slides: AROM;AAROM;Both;Seated;20 reps;15 reps;Other (comment)(therapist assist and then self assist with sheet) Straight Leg Raises: AAROM;AROM;Both;10 reps;Seated Other Exercises Other Exercises: toe wiggles R x 20    General Comments        Pertinent Vitals/Pain Pain Score: 2  Pain Location: R knee Pain Descriptors / Indicators: Grimacing;Guarding Pain Intervention(s): Repositioned;Monitored during session    Home Living                      Prior Function            PT Goals (current goals can now be found in the care plan section) Progress towards PT goals: Progressing toward goals    Frequency    Min 3X/week      PT Plan Current plan remains appropriate    Co-evaluation              AM-PAC PT "6 Clicks" Daily Activity  Outcome Measure  Difficulty turning over in bed (including adjusting bedclothes,  sheets and blankets)?: A Lot Difficulty moving from lying on back to sitting on the side of the bed? : Unable Difficulty sitting down on and standing up from a chair with arms (e.g., wheelchair, bedside commode, etc,.)?: Unable Help needed moving to and from a bed to chair (including a wheelchair)?: A Little Help needed walking in hospital room?: Total Help needed climbing 3-5 steps with a railing? : Total 6 Click Score: 9    End of Session Equipment Utilized During Treatment: Other (comment)(camboot) Activity Tolerance: Patient tolerated treatment well Patient left: in chair;with call bell/phone within reach Nurse Communication: Mobility status PT Visit Diagnosis: Muscle weakness (generalized) (M62.81);Pain;Other abnormalities of gait and mobility (R26.89) Pain - Right/Left: Right Pain - part of body:  Leg     Time: 9373-4287 PT Time Calculation (min) (ACUTE ONLY): 26 min  Charges:  $Therapeutic Exercise: 8-22 mins $Therapeutic Activity: 8-22 mins                    G CodesMagda Kiel, [place] [phone] 06/08/2017    Reginia Naas 06/08/2017, 1:51 PM

## 2017-06-08 NOTE — Social Work (Addendum)
CSW discussed with clinical supervisor, pt will be on the DTP(difficult to place) list as patient is not a citizen, not eligible for medicaid, has no insurance, non weight bearing, and resides alone.  Therapy will need to work with him a few times a day and hope to continue to progress him.    CSW will continue to follow.  Elissa Hefty, LCSW Clinical Social Worker [phone]

## 2017-06-09 NOTE — Progress Notes (Signed)
Orthopaedic Trauma Progress Note  S: Doing well. Pain controlled  O:  Vitals:   06/08/17 2044 06/09/17 0500  BP: 131/86 119/76  Pulse: 88 96  Resp: 16 18  Temp: 98.2 F (36.8 C) 98.4 F (36.9 C)  SpO2: 95% 94%  LLE: incisions clean dry and intact. Compartments soft and compressible. Neurovascularly intact distally. Able to bend and straighten leg.  RLE: Improved swelling over lateral aspect of calacneus. There is a pressure sore over the anterior ankle still with some blistering.Compartments soft and compressible. Neuro intact. Brisk cap refill.  A/P: 31 year old male s/p IMN right tibia shaft fracture and ORIF left tibial plateau also with Sanders 2 right intra-articular calcaneus fracture  NWB BLE PT/OT Awaiting placement Potentially ORIF if swelling improves during hospitalization  Shona Needles, MD [facility] [phone] (phone)

## 2017-06-09 NOTE — Progress Notes (Signed)
Orthopaedic Trauma Service progress note  Patient already seen and evaluated this morning please see by Dr. Doreatha Martin  Discussed clinical case with case management, social work.  Patient is on the difficult to place a list.  Contact is still trying to be made his employer to verify Worker's Comp.  I did contact the hematology and oncology team at [facility].  Patient infusion treatment for his ALL is quite complex.  Treatment cycle is 28 days daily infusions.  These have been done as an outpatient in the past however given patient's current circumstances he will likely require inpatient stay for treatment.  Hematology and oncology team is agreeable to have patient transferred there once he is stable from a orthopedic standpoint.  We will discuss further as to whether or not patient's calcaneus fracture will be treated surgically.  If it is I would anticipate transfer to [facility] 2-3 days after surgery.  If we treated nonoperatively I am hopeful to transfer patient Thursday or Friday of this week.  Jari Pigg, PA-C [facility] [phone] (C) [phone] (O) 06/09/2017 3:15 PM

## 2017-06-09 NOTE — Plan of Care (Signed)
  Safety: Ability to remain free from injury will improve 06/09/2017 1322 - Progressing by Williams Che, RN   Pain Managment: General experience of comfort will improve 06/09/2017 1322 - Progressing by Williams Che, RN   Physical Regulation: Ability to maintain clinical measurements within normal limits will improve 06/09/2017 1322 - Progressing by Williams Che, RN   Activity: Risk for activity intolerance will decrease 06/09/2017 1322 - Progressing by Williams Che, RN   Bowel/Gastric: Will not experience complications related to bowel motility 06/09/2017 1322 - Progressing by Williams Che, RN

## 2017-06-09 NOTE — Care Management (Signed)
Wayne Grimes, Unit Social worker, reached out to [employer] and was able to get worker's comp information! Patient is covered by [employer], Adjuster is Odis Luster , ph:[phone], fax [phone]. Lorn Junes just received claim information on today, 06/09/17. She is assigning  Ralene Muskrat, Nurse Case Manager who will be following patient.  Ainsley Spinner, PA has been updated with this information. Lanny Hurst is also reaching out to patient's oncologist at [facility] so that we can keep patient on treatment plan. Case management will continue to follow.

## 2017-06-10 LAB — CREATININE, SERUM
CREATININE: 0.73 mg/dL (ref 0.61–1.24)
GFR calc Af Amer: >60 mL/min (ref >60)

## 2017-06-10 NOTE — Progress Notes (Addendum)
Spoke w Alden Benjamin at [employer] who provided me with the contact at [employer] who is their  [employer] provider. Contact is Odis Luster, who can be reached at [phone]. CM left VM, for callback, in attempt to get a claim number.  15:30 Spoke w Odis Luster. Claim number provided [id number]. She states that transportation to [place] and services provided at [facility] will not be covered through [facility], but transportation from [place] to SNF and SNF can be.  Please use Ralene Muskrat [phone] to set up discharge planning

## 2017-06-10 NOTE — Plan of Care (Signed)
  Safety: Ability to remain free from injury will improve 06/10/2017 1111 - Progressing by Williams Che, RN   Pain Managment: General experience of comfort will improve 06/10/2017 1111 - Progressing by Williams Che, RN   Physical Regulation: Ability to maintain clinical measurements within normal limits will improve 06/10/2017 1111 - Progressing by Williams Che, RN   Bowel/Gastric: Will not experience complications related to bowel motility 06/10/2017 1111 - Progressing by Williams Che, RN

## 2017-06-10 NOTE — Progress Notes (Signed)
Occupational Therapy Treatment Patient Details Name: Wayne Grimes MRN: [id number] DOB: 06-16-1986 Today's Date: 06/10/2017    History of present illness Pt is a 31 y.o. male who sustained a 12 foot fall from a ladder. 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 Recently diagnosed with community-aquired pneumonia. Pt with L tibial plateau fracture s/p ORIF and R tibial shaft fracture s/p IM nailing. Also sustained R calcaneus fracture.    OT comments  PATIENT WAS MOTIVATED DURING SESSION TO INCREASE ABILITY WITH ADLS. PNT REQUIRED ASSIST TO [facility] BACK AND WAS MOD A WITH LE BATHING IN [place]. PATIENT WAS ABLE TO ROLL TO ASSIST WITH LE BATHING AND TOILETING.  Follow Up Recommendations       Equipment Recommendations       Recommendations for Other Services      Precautions / Restrictions Precautions Precautions: Fall Precaution Comments: B LE NWB Restrictions Weight Bearing Restrictions: Yes RLE Weight Bearing: Non weight bearing LLE Weight Bearing: Non weight bearing       Mobility Bed Mobility                  Transfers                      Balance                                           ADL either performed or assessed with clinical judgement   ADL           Upper Body Bathing: Supervision/ safety;Set up   Lower Body Bathing: Moderate assistance                         General ADL Comments: PATIENT WAS ON BEDPAN WHEN THERAPIST ENTERED ROOM. PNT WAS ABLE TO ROLL OFF BEPAIN AND REQUIRED      Vision       Perception     Praxis      Cognition Arousal/Alertness: Awake/alert Behavior During Therapy: WFL for tasks assessed/performed Overall Cognitive Status: Within Functional Limits for tasks assessed                                          Exercises     Shoulder Instructions       General Comments      Pertinent Vitals/ Pain       Pain  Assessment: 0-10 Pain Score: 4  Pain Location: (B LE) Pain Descriptors / Indicators: Aching  Home Living                                          Prior Functioning/Environment              Frequency           Progress Toward Goals  OT Goals(current goals can now be found in the care plan section)  Progress towards OT goals: Progressing toward goals  ADL Goals Pt Will Perform Grooming: with set-up;sitting Pt Will Perform Upper Body Dressing: with min assist;sitting  Plan      Co-evaluation  AM-PAC PT "6 Clicks" Daily Activity     Outcome Measure   Help from another person eating meals?: None Help from another person taking care of personal grooming?: A Little Help from another person toileting, which includes using toliet, bedpan, or urinal?: A Lot Help from another person bathing (including washing, rinsing, drying)?: A Lot Help from another person to put on and taking off regular upper body clothing?: A Little Help from another person to put on and taking off regular lower body clothing?: A Lot 6 Click Score: 16    End of Session        Activity Tolerance Patient tolerated treatment well   Patient Left in bed   Nurse Communication (OK TREATMENT)        Time: 1055-1140 OT Time Calculation (min): 45 min  Charges: OT General Charges $OT Visit: 1 Visit OT Treatments $Self Care/Home Management : 16-10 mins  6 CLICKS   Rosebud Koenen 06/10/2017, 11:45 AM

## 2017-06-10 NOTE — Progress Notes (Signed)
Orthopedic Trauma Service Progress Note   Patient ID: Wayne Grimes MRN: [id number] DOB/AGE: October 02, 1985 31 y.o.  Subjective:  No acute issues No changes Doing ok   Workers comp has received claim   Discussed with [facility] Heme[facility]onc yesterday [facility] is stable from ortho standpoint to receive is infusion treatment for ALL. See my note from 06/09/2017   ROS As above   Objective:   VITALS:   Vitals:   06/09/17 0500 06/09/17 1359 06/09/17 2210 06/10/17 0535  BP: 119/76 123/78 121/74 125/71  Pulse: 96 (!) 103 95 95  Resp: 18 18 18 17   Temp: 98.4 F (36.9 C) 98.3 F (36.8 C) 98 F (36.7 C) 98 F (36.7 C)  TempSrc: Oral Oral Oral Oral  SpO2: 94% 95% 95% 95%  Weight:      Height:        Estimated body mass index is 36.15 kg/m as calculated from the following:   Height as of this encounter: 5\' 6"  (1.676 m).   Weight as of this encounter: 101.6 kg (224 lb).   Intake/Output      11/06 0701 - 11/07 0700 11/07 0701 - 11/08 0700   P.O. 720    Total Intake(mL/kg) 720 (7.1)    Urine (mL/kg/hr) 800 (0.3)    Total Output 800    Net -80           LABS  Results for orders placed or performed during the hospital encounter of 05/27/17 (from the past 24 hour(s))  Creatinine, serum     Status: None   Collection Time: 06/10/17  4:34 AM  Result Value Ref Range   Creatinine, Ser 0.73 0.61 - 1.24 mg/dL   GFR calc non Af Amer >60 >60 mL/min   GFR calc Af Amer >60 >60 mL/min     PHYSICAL EXAM:   Gen: Appears comfortable, awake and alert Lungs: Clear anterior fields Cardiac: Regular, S1 and S2 Abd:+ Bowel sounds, nontender Ext:       Right lower extremity             compressive dressing right foot and ankle removed    Moderate swelling still present   Anterior blistering stable   Skin does not wrinkle laterally              Incision right knee and proximal lower leg are stable, no signs of  infection, no drainage             Patient able to perform active knee extension and flexion.  He can perform straight leg raise  Pt able to perform some ankle ROM              Distal motor and sensory functions are grossly intact             Extremity is warm       Left lower extremity                All wounds are stable             Motor and sensory functions are intact             Patient can perform active knee flexion extension as well as perform quad set and straight leg raise without difficulty.             Extremity is warm             Moderate swelling  No deep calf tenderness             + DP pulse             Compartments are soft and nontender, no pain with passive stretching             Excellent knee range of motion   Assessment/Plan: 12 Days Post-Op   Principal Problem:   Accidental fall from ladder Active Problems:   Displaced comminuted fracture of shaft of right tibia, initial encounter for closed fracture   Displaced fracture of body of right calcaneus, initial encounter for closed fracture   Closed fracture of lateral portion of left tibial plateau   History of DVT (deep vein thrombosis)   Pneumonia   Anti-infectives (From admission, onward)   Start     Dose/Rate Route Frequency Ordered Stop   06/03/17 1415  amoxicillin-clavulanate (AUGMENTIN) 875-125 MG per tablet 1 tablet     1 tablet Oral Every 12 hours 06/03/17 1342 06/06/17 2112   06/03/17 1345  amoxicillin-clavulanate (AUGMENTIN) 875-125 MG per tablet 1 tablet  Status:  Discontinued     1 tablet Oral Every 12 hours 06/03/17 1335 06/03/17 1342   06/01/17 1400  doxycycline (VIBRA-TABS) tablet 100 mg  Status:  Discontinued     100 mg Oral Every 12 hours 06/01/17 1341 06/03/17 1335   06/01/17 1345  levofloxacin (LEVAQUIN) tablet 500 mg  Status:  Discontinued     500 mg Oral Daily 06/01/17 1331 06/01/17 1341   05/29/17 1400  ceFAZolin (ANCEF) IVPB 2g/100 mL premix  Status:  Discontinued     2  g 200 mL/hr over 30 Minutes Intravenous Every 8 hours 05/29/17 1319 05/29/17 1352   05/29/17 1014  vancomycin (VANCOCIN) powder  Status:  Discontinued       As needed 05/29/17 1014 05/29/17 1054   05/29/17 0000  ceFAZolin (ANCEF) IVPB 2g/100 mL premix     2 g 200 mL/hr over 30 Minutes Intravenous To Surgery 05/28/17 1013 05/30/17 0000   05/28/17 2200  piperacillin-tazobactam (ZOSYN) IVPB 3.375 g  Status:  Discontinued     3.375 g 12.5 mL/hr over 240 Minutes Intravenous Every 8 hours 05/28/17 1522 06/01/17 1331   05/28/17 1600  piperacillin-tazobactam (ZOSYN) IVPB 3.375 g     3.375 g 100 mL/hr over 30 Minutes Intravenous  Once 05/28/17 1522 05/28/17 1745   05/28/17 1100  levofloxacin (LEVAQUIN) IVPB 750 mg  Status:  Discontinued     750 mg 100 mL/hr over 90 Minutes Intravenous Every 24 hours 05/28/17 0916 05/28/17 1445   05/28/17 1000  levofloxacin (LEVAQUIN) tablet 750 mg  Status:  Discontinued     750 mg Oral Daily 05/27/17 2154 05/28/17 0916    .  POD/HD#:   31 year old male status post fall with bilateral lower extremity fractures history complicated by active ALL   -Left lateral tibial plateau fracture s/p ORIF             Nonweightbearing x 6 weeks             Unrestricted range of motion left knee             Home exercise program including quad sets, straight leg raises, short arc quads, long arc quads, ankle Thera-Band program are permissible.  Please see sticky note in the chart             No resting with knee in flexion.  Place pillows under ankle or use zero knee bone foam  to maintain full extension at rest                     PT and OT               Ice and elevate             TED hose   -Right tibial shaft fracture status post IM nail             Nonweightbearing times 8 weeks              Unrestricted knee range of motion, exercises as noted above             Okay to perform ankle range of motion as well.  Encourage patient to wiggle toes to help with swelling              Ice and elevate             PT and OT   -Comminuted right calcaneus fracture             Nonweightbearing             Soft tissue swelling still precluding safe surgical intervention               bulky compressive dressing to help with swelling control             Aggressive ice and elevation above heart             Encouraged toe motion to help with swelling resolution is well   Looks like this will be managed non-op for now due to swelling        - Pain management:             Current regimen does appear to be effective.    Will eliminate IV Dilaudid at this time                         Dilaudid 2 mg 1-2 tabs p.o. every 4 hours as needed for pain                         Tylenol 1000 mg p.o. every 6 hours scheduled                         Robaxin 1000 mg p.o. every 6 hours scheduled     - ABL anemia/Hemodynamics             CBC is stable             Platelets are stable   - Medical issues                Appreciate internal medicine's assistance with patient management               Sinus tach                         Appears to continue to improve, current heart rate in the 90s                         CBC is stable                          Echo notable for some IVC dilation grade 1  diastolic dysfunction                          thyroid function possibly component of hyperthyroidism.  Outpatient workup                 ALL                         pt due for next round of infusion therapy    Looks like this will need to be done as inpatient due to pts physical and social limitations   Hopeful to transfer tomorrow or Friday   - DVT/PE prophylaxis:             Lovenox   - ID:              Multi lobar pneumonia                         Patient was treated with Levaquin prior to admission for pneumonia as well                         Patient completed course of Zosyn, was transitioned to doxycycline and is now transitioned to Augmentin until 06/06/2017- completed                           Continue aggressive incentive spirometry/pulmonary toilet   - Metabolic Bone Disease:             Vitamin D deficiency             Patient with mildly elevated TSH as well as minimally elevated T4.  Not sure this is contributory to the tachycardia.  As above   - Activity:             Out of bed with assistance             Nonweightbearing bilaterally   - FEN/GI prophylaxis/Foley/Lines:             Regular diet   -Ex-fix/Splint care:             Okay to shower and clean wounds with soap and water             Remove all dressings and bracing     - Dispo:             likely transfer to [facility] heme onc for infusion therapy tomorrow or Friday        Jari Pigg, PA-C [facility] [phone] Levi Aland (C) 06/10/2017, 9:38 AM

## 2017-06-11 ENCOUNTER — Encounter (HOSPITAL_COMMUNITY): Payer: Self-pay | Admitting: Orthopedic Surgery

## 2017-06-11 DIAGNOSIS — E559 Vitamin D deficiency, unspecified: Secondary | ICD-10-CM

## 2017-06-11 DIAGNOSIS — E079 Disorder of thyroid, unspecified: Secondary | ICD-10-CM

## 2017-06-11 HISTORY — DX: Disorder of thyroid, unspecified: E07.9

## 2017-06-11 HISTORY — DX: Vitamin D deficiency, unspecified: E55.9

## 2017-06-11 MED ORDER — ENOXAPARIN SODIUM 40 MG/0.4ML ~~LOC~~ SOLN: 40.0000 mg | SUBCUTANEOUS | Status: AC

## 2017-06-11 MED ORDER — HYDROMORPHONE HCL 2 MG PO TABS: 2.0000 mg | ORAL_TABLET | ORAL | 0 refills | Status: AC | PRN

## 2017-06-11 MED ORDER — CALCIUM CITRATE 950 (200 CA) MG PO TABS: 200.0000 mg | ORAL_TABLET | Freq: Two times a day (BID) | ORAL | Status: AC

## 2017-06-11 MED ORDER — ALPRAZOLAM 0.5 MG PO TABS: 0.5000 mg | ORAL_TABLET | Freq: Two times a day (BID) | ORAL | 0 refills | Status: AC | PRN

## 2017-06-11 MED ORDER — ACETAMINOPHEN 500 MG PO TABS: 1000.0000 mg | ORAL_TABLET | Freq: Four times a day (QID) | ORAL | 0 refills | Status: AC

## 2017-06-11 MED ORDER — POLYETHYLENE GLYCOL 3350 17 G PO PACK
17.0000 g | PACK | Freq: Every day | ORAL | 0 refills | Status: AC
Start: 2017-06-12 — End: ?

## 2017-06-11 MED ORDER — METOPROLOL TARTRATE 25 MG PO TABS: 25.0000 mg | ORAL_TABLET | Freq: Two times a day (BID) | ORAL | Status: AC

## 2017-06-11 MED ORDER — METHOCARBAMOL 500 MG PO TABS: 1000.0000 mg | ORAL_TABLET | Freq: Four times a day (QID) | ORAL | Status: AC | PRN

## 2017-06-11 MED ORDER — SENNOSIDES-DOCUSATE SODIUM 8.6-50 MG PO TABS
1.0000 | ORAL_TABLET | Freq: Every day | ORAL | Status: AC
Start: 2017-06-11 — End: ?

## 2017-06-11 MED ORDER — CHOLECALCIFEROL 50 MCG (2000 UT) PO TABS: 2000.0000 [IU] | ORAL_TABLET | Freq: Two times a day (BID) | ORAL | Status: AC

## 2017-06-11 NOTE — Discharge Instructions (Signed)
Orthopaedic Trauma Service Discharge Instructions   General Discharge Instructions  (please see last orthopaedic progress note which is included in DC summary as well)  WEIGHT BEARING STATUS: Nonweightbearing right leg Nonweightbearing left leg  RANGE OF MOTION/ACTIVITY: Unrestricted range of motion Right and left knee  excercise program may including quad sets, straight leg raises, short arc quads, long arc quads, Bilateral ankle Thera-Band program, heel cord stretching are permissible. Other exercises that do not compromise weightbearing restrictions are also permissible   No resting with knee in flexion. Place pillows under ankle or use zero knee bone foam to maintain full extension at rest  daily PT and OT             Pt should be mobilized several times from bed to chair to prevent pressure sores   Weightbearing will not be permitted until about 07/24/2017  Wound Care:  Daily wound care as needed     Ok to clean wounds with soap and water     See below   Please change compressive wrap to R ankle daily.  Use webril/cast padding against skin and wrap ace wrap starting at foot and going above ankle TED hose to L leg (thigh high)  Discharge Wound Care Instructions  Do NOT apply any ointments, solutions or lotions to pin sites or surgical wounds.  These prevent needed drainage and even though solutions like hydrogen peroxide kill bacteria, they also damage cells lining the pin sites that help fight infection.  Applying lotions or ointments can keep the wounds moist and can cause them to breakdown and open up as well. This can increase the risk for infection. When in doubt call the office.  Surgical incisions should be dressed daily.  If any drainage is noted, use one layer of adaptic, then gauze, Kerlix, and an ace wrap.  Once the incision is completely dry and without drainage, it may be left open to air out.  Showering may begin 36-48  hours later.  Cleaning gently with soap and water.  Traumatic wounds should be dressed daily as well.    One layer of adaptic, gauze, Kerlix, then ace wrap.  The adaptic can be discontinued once the draining has ceased    If you have a wet to dry dressing: wet the gauze with saline the squeeze as much saline out so the gauze is moist (not soaking wet), place moistened gauze over wound, then place a dry gauze over the moist one, followed by Kerlix wrap, then ace wrap.  CALL OFFICE WITH QUESTIONS OR CONCERNS [phone]    DVT/PE prophylaxis: lovenox 40 mg sq daily x 28 more days (end 07/09/2017)  Diet: as you were eating previously.  Can use over the counter stool softeners and bowel preparations, such as Miralax, to help with bowel movements.  Narcotics can be constipating.  Be sure to drink plenty of fluids  PAIN MEDICATION USE AND EXPECTATIONS  You have likely been given narcotic medications to help control your pain.  After a traumatic event that results in an fracture (broken bone) with or without surgery, it is ok to use narcotic pain medications to help control one's pain.  We understand that everyone responds to pain differently and each individual patient will be evaluated on a regular basis for the continued need for narcotic medications. Ideally, narcotic medication use should last no more than 6-8 weeks (coinciding with fracture healing).   As a patient it is your responsibility as well to monitor narcotic medication use and  report the amount and frequency you use these medications when you come to your office visit.   We would also advise that if you are using narcotic medications, you should take a dose prior to therapy to maximize you participation.  IF YOU ARE ON NARCOTIC MEDICATIONS IT IS NOT PERMISSIBLE TO OPERATE A MOTOR VEHICLE (MOTORCYCLE/CAR/TRUCK/MOPED) OR HEAVY MACHINERY DO NOT MIX NARCOTICS WITH OTHER CNS (CENTRAL NERVOUS SYSTEM) DEPRESSANTS SUCH AS ALCOHOL   STOP  SMOKING OR USING NICOTINE PRODUCTS!!!!  As discussed nicotine severely impairs your body's ability to heal surgical and traumatic wounds but also impairs bone healing.  Wounds and bone heal by forming microscopic blood vessels (angiogenesis) and nicotine is a vasoconstrictor (essentially, shrinks blood vessels).  Therefore, if vasoconstriction occurs to these microscopic blood vessels they essentially disappear and are unable to deliver necessary nutrients to the healing tissue.  This is one modifiable factor that you can do to dramatically increase your chances of healing your injury.    (This means no smoking, no nicotine gum, patches, etc)  DO NOT USE NONSTEROIDAL ANTI-INFLAMMATORY DRUGS (NSAID'S)  Using products such as Advil (ibuprofen), Aleve (naproxen), Motrin (ibuprofen) for additional pain control during fracture healing can delay and/or prevent the healing response.  If you would like to take over the counter (OTC) medication, Tylenol (acetaminophen) is ok.  However, some narcotic medications that are given for pain control contain acetaminophen as well. Therefore, you should not exceed more than 4000 mg of tylenol in a day if you do not have liver disease.  Also note that there are may OTC medicines, such as cold medicines and allergy medicines that my contain tylenol as well.  If you have any questions about medications and/or interactions please ask your doctor/PA or your pharmacist.      ICE AND ELEVATE INJURED/OPERATIVE EXTREMITY  Using ice and elevating the injured extremity above your heart can help with swelling and pain control.  Icing in a pulsatile fashion, such as 20 minutes on and 20 minutes off, can be followed.    Do not place ice directly on skin. Make sure there is a barrier between to skin and the ice pack.    Using frozen items such as frozen peas works well as the conform nicely to the are that needs to be iced.  USE AN ACE WRAP OR TED HOSE FOR SWELLING CONTROL  In  addition to icing and elevation, Ace wraps or TED hose are used to help limit and resolve swelling.  It is recommended to use Ace wraps or TED hose until you are informed to stop.    When using Ace Wraps start the wrapping distally (farthest away from the body) and wrap proximally (closer to the body)   Example: If you had surgery on your leg or thing and you do not have a splint on, start the ace wrap at the toes and work your way up to the thigh        If you had surgery on your upper extremity and do not have a splint on, start the ace wrap at your fingers and work your way up to the upper arm  [place]: [phone]

## 2017-06-11 NOTE — Discharge Summary (Signed)
Orthopaedic Trauma Service (OTS)  Patient ID: Wayne Grimes MRN: [id number] DOB/AGE: August 04, 1986 31 y.o.  Admit date: 05/27/2017 Discharge date: 06/11/2017  Admission Diagnoses: Fall from a ladder Work-related accident Comminuted right tibial shaft fracture Comminuted right intra-articular calcaneus fracture Left lateral tibial plateau fracture Pneumonia  Discharge Diagnoses:  Principal Problem:   Accidental fall from ladder Active Problems:   Displaced comminuted fracture of shaft of right tibia, initial encounter for closed fracture   Displaced fracture of body of right calcaneus, initial encounter for closed fracture   Closed fracture of lateral portion of left tibial plateau   History of DVT (deep vein thrombosis)   Pneumonia   Thyroid dysfunction   Vitamin D deficiency   Procedures Performed: 05/29/2017-Dr. Haddix 1. CPT 27759-Intramedullary nailing of right tibial shaft fracture 2. CPT 27535-ORIF of left lateral tibial plateau fracture   Closed treatment R calcaneus fracture   Discharged Condition: stable  Hospital Course:   Patient is a 31 year old Hispanic male who was admitted to [facility] on 05/27/2017.  Patient sustained a fall while at work of approximately 12 feet off of a ladder.  He was initially brought to [facility] in [place].  Due to the complexity of his injuries he was admitted to the orthopedic service at [facility].  Due to the complexity of his injuries the orthopedic trauma service was consulted.  Patient was seen and evaluated by the orthopedic trauma service on 05/28/2017 in the surgical holding area.  Patient  was tachycardic and hypoxic in the holding area.  We did review his medical history.  Notable for current treatment for pneumonia and was on oral Levaquin along with active treatment for ALL.  There was also concern for compartment syndrome in his right leg.  His compartments were checked with the [employer]  intracompartmental monitor device at the bedside.  His delta P's for all compartments were appropriate and emergent fasciotomies were not required.  Given his overall medical picture surgery was delayed on his day of arrival at [facility].  CT angiogram was obtained and medical service consult was obtained as well.  Doppler ultrasound of bilateral lower extremities was also performed.  This was negative for acute DVT bilaterally.  CT angiogram was negative for acute pulmonary embolism but was notable for multi lobar pneumonia.  Patient was restarted on IV antibiotics.  He was monitored for another 24 hours and then was taken to the operating room for the procedures noted above on 05/29/2017.  Patient had severe swelling to the right foot and ankle as such fixation of his right calcaneus was not pursued.  After surgery patient was transferred back to the orthopedic floor for observation, pain control and therapies.  He continued to be monitored by the internal medicine service.  The medical service did contact hematology oncology at [facility] and his appropriate leukemia medications were continued.  We continued to monitor the patient very closely with respect to soft tissue swelling in the right foot and ankle.  He did begin to develop fracture blisters associated with his injury and his swelling really never got any better, thus decision was made to treat his calcaneus nonoperatively due to increased risk of complications.    Patient was covered with Lovenox for DVT and PE prophylaxis during his hospital stay as well.      It was initially thought that the patient did not have coverage through [employer] despite his injury occurring at work.  As such she was placed on  the difficult place list for skilled nursing center.  Due to the bilateral nature of his injuries patient will be nonweightbearing for 6-8 weeks.  He lives alone thus discharging him to home is not prudent or safe.  Patient also  did have some issues related to persistent tachycardia postoperatively.  A fairly extensive workup was performed which really was unrevealing.  Echocardiogram was performed which did show some mild diastolic dysfunction but nothing else to suggest causation for his persistent tachycardia.  He also did have some mildly elevated thyroid function tests but again not likely causative.  It was Presumed that his tachycardia was due to pain.  His pain medications were titrated and we achieved a good result with oral Dilaudid.  Patient was also started on metoprolol by the medical service and his heart rate did respond favorably to this.  Eventually we did recontact the hematology oncology service at [facility] to discuss his treatment protocol.  Patient's infusion therapy for his ALL is quite complex and intensive.  It does require him to go in on a daily basis for treatment.  Therefore after discussion with the hematology oncology service it was felt that once he was stabilized from an orthopedic standpoint he would be transferred to their institution on the inpatient side to receive his next cycle of treatment.  Our case management team was able to contact the patient's employer.  There is indeed an active workers comp claim for this accident.   Patient will be nonweightbearing on his right leg for 8 weeks from the day of surgery and nonweightbearing on his left leg  for 6 weeks from the day of surgery.   I do anticipate that the patient will need a transfer to a skilled nursing center after he is completed his infusion treatment at [facility] as he will likely still be nonweightbearing and require fairly moderate assistance for ADLs since patient lives alone  Patient is tolerating a regular diet, voiding without difficulty had a bowel movement prior to discharge.  Patient is discharged in stable condition on 06/11/2017 to [facility] to the leukemia  service   Consults: Internal medicine  Significant Diagnostic Studies: labs:   Results for IRINEO, GAULIN (MRN [id number]) as of 06/11/2017 12:42  Ref. Range 06/08/2017 05:26  WBC Latest Ref Range: 4.0 - 10.5 K/uL 4.9  RBC Latest Ref Range: 4.22 - 5.81 MIL/uL 3.77 (L)  Hemoglobin Latest Ref Range: 13.0 - 17.0 g/dL 11.0 (L)  HCT Latest Ref Range: 39.0 - 52.0 % 35.0 (L)  MCV Latest Ref Range: 78.0 - 100.0 fL 92.8  MCH Latest Ref Range: 26.0 - 34.0 pg 29.2  MCHC Latest Ref Range: 30.0 - 36.0 g/dL 31.4  RDW Latest Ref Range: 11.5 - 15.5 % 16.6 (H)  Platelets Latest Ref Range: 150 - 400 K/uL 192  Neutrophils Latest Units: % 34  Lymphocytes Latest Units: % 52  Monocytes Relative Latest Units: % 11  Eosinophil Latest Units: % 3  Basophil Latest Units: % 0  NEUT# Latest Ref Range: 1.7 - 7.7 K/uL 1.7  Lymphocyte # Latest Ref Range: 0.7 - 4.0 K/uL 2.6  Monocyte # Latest Ref Range: 0.1 - 1.0 K/uL 0.5  Eosinophils Absolute Latest Ref Range: 0.0 - 0.7 K/uL 0.1  Basophils Absolute Latest Ref Range: 0.0 - 0.1 K/uL 0.0  RBC Morphology Unknown POLYCHROMASIA PRE...  WBC Morphology Unknown ATYPICAL LYMPHOCYTES   Results for DREQUAN, IRONSIDE (MRN [id number]) as of 06/11/2017 12:42  Ref. Range 06/02/2017 09:18  PTH, Intact Latest Ref Range: 15 - 65 pg/mL 56  Calcium, Total (PTH) Latest Ref Range: 8.7 - 10.2 mg/dL 7.9 (L)  PTH Interp Unknown Comment  Triiodothyronine,Free,Serum Latest Ref Range: 2.0 - 4.4 pg/mL 3.2  T4,Free(Direct) Latest Ref Range: 0.61 - 1.12 ng/dL 1.19 (H)   Results for HUBBARD, SELDON (MRN [id number]) as of 06/11/2017 12:42  Ref. Range 06/02/2017 09:18  Vit D, 1,25-Dihydroxy Latest Ref Range: 19.9 - 79.3 pg/mL 31.9  Vitamin D, 25-Hydroxy Latest Ref Range: 30.0 - 100.0 ng/mL 17.9 (L)   Results for DEVARION, MCCLANAHAN (MRN [id number]) as of 06/11/2017 12:42  Ref. Range 05/31/2017 09:14  TSH Latest Ref Range: 0.350 - 4.500 uIU/mL 5.654 (H)    Imaging  CLINICAL  DATA:  Tachycardia. Hypoxia. History of leukemia. Lower extremity fracture sustained 1 day prior.   EXAM: CT ANGIOGRAPHY CHEST WITH CONTRAST   TECHNIQUE: Multidetector CT imaging of the chest was performed using the standard protocol during bolus administration of intravenous contrast. Multiplanar CT image reconstructions and MIPs were obtained to evaluate the vascular anatomy.   CONTRAST:  100 cc Isovue 370 IV.   COMPARISON:  Chest radiograph from earlier today.   FINDINGS: Cardiovascular: The study is high quality for the evaluation of pulmonary embolism. There are no filling defects in the central, lobar, segmental or subsegmental pulmonary artery branches to suggest acute pulmonary embolism. Great vessels are normal in course and caliber. Normal heart size. No significant pericardial fluid/thickening. Right internal jugular MediPort terminates in the right atrium.   Mediastinum/Nodes: No discrete thyroid nodules. Unremarkable esophagus. No pathologically enlarged axillary, mediastinal or hilar lymph nodes.   Lungs/Pleura: No pneumothorax. No pleural effusion. There is dense patchy consolidation in the bilateral lower lobes, predominantly in the dependent lower lobes. There is extensive patchy tree-in-bud opacity throughout the dependent lungs bilaterally involving all lobes.   Upper abdomen: Unremarkable.   Musculoskeletal:  No aggressive appearing focal osseous lesions.   Review of the MIP images confirms the above findings.   IMPRESSION: 1. No pulmonary embolism. 2. Dense patchy consolidation in the dependent bilateral lower lobes. Extensive patchy tree-in-bud opacity throughout the dependent lungs bilaterally involving all lung lobes. Findings are most compatible with multilobar aspiration pneumonia.  Echo Study Conclusions   - Left ventricle: The cavity size was normal. Wall thickness was   normal. Systolic function was normal. The estimated ejection    fraction was in the range of 55% to 60%. Wall motion was normal;   there were no regional wall motion abnormalities. The study is   not technically sufficient to allow evaluation of LV diastolic   function. The E/e&' ratio is between 8-15, suggesting   indeterminate LV filling pressure. - Left atrium: The atrium was normal in size. - Inferior vena cava: The vessel was dilated. The respirophasic   diameter changes were blunted (< 50%), consistent with elevated   central venous pressure.   Impressions:   - LVEF 55-60%, normal wall thickness, normal wall motion, grade 1   DD, indeterminate LV filling pressure, normal LA size, dilated   IVC.  Lower extremity doppler Final Interpretation Right  There is no evidence of deep vein thrombosis in the lower extremity. However, portions of this examination were limited- see technologist comments above.  Left   There is no evidence of deep vein thrombosis in the lower extremity.   Treatments: IV hydration, antibiotics: levaquin, Zosyn, doxycycline and Augmentin, analgesia: acetaminophen and Dilaudid, cardiac meds: metoprolol, anticoagulation: LMW heparin, therapies:  PT, OT, RN and SW and surgery: As above  Discharge Exam:  Orthopedic Trauma Service Progress Note    Patient ID: Wayne Grimes MRN: [id number] DOB/AGE: 1985-12-11 31 y.o.   Subjective:   No new issues   Discussed case with Heme/Onc (leukemia service) at [facility]. They will accept pt in transfer for inpt administration of chemo infusion    Spanish interpretor at bedside Plan discussed once again with the patient Continues to ask when his next surgery is   Again reviewed in extensive detail why we are not operating on his right calcaneus.  Patient is still too swollen with fracture blistering.  We feel that the risk is too great to proceed with surgical intervention.  He has high risk for the development of postsurgical complications in this area including infection  which could possibly lead to limb loss.  Patient may regain adequate function without surgical intervention of his calcaneus but may require future reconstruction/fusion   Orthopedic issues are stable     Review of Systems  Constitutional: Negative for chills and fever.  Respiratory: Negative for shortness of breath and wheezing.   Cardiovascular: Negative for chest pain and palpitations.  Neurological: Negative for tingling and sensory change.      Objective:    VITALS:         Vitals:    06/10/17 0535 06/10/17 1528 06/10/17 2121 06/11/17 0940  BP: 125/71 113/67 (!) 112/53 113/64  Pulse: 95 94 98 (!) 103  Resp: 17 18 16     Temp: 98 F (36.7 C) 98 F (36.7 C) 98.1 F (36.7 C)    TempSrc: Oral Oral Oral    SpO2: 95% 96% 98%    Weight:          Height:              Estimated body mass index is 36.15 kg/m as calculated from the following:   Height as of this encounter: 5\' 6"  (1.676 m).   Weight as of this encounter: 101.6 kg (224 lb).     Intake/Output      11/07 0701 - 11/08 0700 11/08 0701 - 11/09 0700   P.O. 720    Total Intake(mL/kg) 720 (7.1)    Urine (mL/kg/hr) 450 (0.2)    Stool 0    Total Output 450    Net +270         Stool Occurrence 2 x       LABS   Lab Results Last 24 Hours  No results found for this or any previous visit (from the past 24 hour(s)).       PHYSICAL EXAM:  Gen: Appears comfortable, awake and alert Lungs: unlabored Cardiac: Regular Abd:+ Bowel sounds, nontender Ext:       Right lower extremity             compressive dressing right foot and ankle removed                          Moderate swelling still present                         Anterior blistering stable                         Skin does not wrinkle laterally              Incision right knee and proximal lower  leg are stable, no signs of infection, no drainage             Patient able to perform active knee extension and flexion.  He can perform straight leg raise              Pt able to perform some ankle ROM              Distal motor and sensory functions are grossly intact             Extremity is warm       Left lower extremity              All wounds are stable             Motor and sensory functions are intact             Patient can perform active knee flexion extension as well as perform quad set and straight leg raise without difficulty.             Extremity is warm             Moderate swelling             No deep calf tenderness             + DP pulse             Compartments are soft and nontender, no pain with passive stretching             Excellent knee range of motion   Assessment/Plan: 13 Days Post-Op    Principal Problem:   Accidental fall from ladder Active Problems:   Displaced comminuted fracture of shaft of right tibia, initial encounter for closed fracture   Displaced fracture of body of right calcaneus, initial encounter for closed fracture   Closed fracture of lateral portion of left tibial plateau   History of DVT (deep vein thrombosis)   Pneumonia               Anti-infectives (From admission, onward)    Start     Dose/Rate Route Frequency Ordered Stop    06/03/17 1415   amoxicillin-clavulanate (AUGMENTIN) 875-125 MG per tablet 1 tablet     1 tablet Oral Every 12 hours 06/03/17 1342 06/06/17 2112    06/03/17 1345   amoxicillin-clavulanate (AUGMENTIN) 875-125 MG per tablet 1 tablet  Status:  Discontinued     1 tablet Oral Every 12 hours 06/03/17 1335 06/03/17 1342    06/01/17 1400   doxycycline (VIBRA-TABS) tablet 100 mg  Status:  Discontinued     100 mg Oral Every 12 hours 06/01/17 1341 06/03/17 1335    06/01/17 1345   levofloxacin (LEVAQUIN) tablet 500 mg  Status:  Discontinued     500 mg Oral Daily 06/01/17 1331 06/01/17 1341    05/29/17 1400   ceFAZolin (ANCEF) IVPB 2g/100 mL premix  Status:  Discontinued     2 g 200 mL/hr over 30 Minutes Intravenous Every 8 hours 05/29/17 1319 05/29/17 1352    05/29/17 1014    vancomycin (VANCOCIN) powder  Status:  Discontinued         As needed 05/29/17 1014 05/29/17 1054    05/29/17 0000   ceFAZolin (ANCEF) IVPB 2g/100 mL premix     2 g 200 mL/hr over 30 Minutes Intravenous To Surgery 05/28/17 1013 05/30/17 0000    05/28/17 2200   piperacillin-tazobactam (ZOSYN) IVPB 3.375 g  Status:  Discontinued  3.375 g 12.5 mL/hr over 240 Minutes Intravenous Every 8 hours 05/28/17 1522 06/01/17 1331    05/28/17 1600   piperacillin-tazobactam (ZOSYN) IVPB 3.375 g     3.375 g 100 mL/hr over 30 Minutes Intravenous  Once 05/28/17 1522 05/28/17 1745    05/28/17 1100   levofloxacin (LEVAQUIN) IVPB 750 mg  Status:  Discontinued     750 mg 100 mL/hr over 90 Minutes Intravenous Every 24 hours 05/28/17 0916 05/28/17 1445    05/28/17 1000   levofloxacin (LEVAQUIN) tablet 750 mg  Status:  Discontinued     750 mg Oral Daily 05/27/17 2154 05/28/17 0916     .   POD/HD#: 58   31 year old male status post fall with bilateral lower extremity fractures history complicated by active ALL   -Left lateral tibial plateau fracture s/p ORIF             Nonweightbearing x 6 weeks             Unrestricted range of motion left knee             Home exercise program including quad sets, straight leg raises, short arc quads, long arc quads, ankle Thera-Band program are permissible.  Please see sticky note in the chart             No resting with knee in flexion.  Place pillows under ankle or use zero knee bone foam to maintain full extension at rest                     PT and OT               Ice and elevate             TED hose   -Right tibial shaft fracture status post IM nail             Nonweightbearing times 8 weeks              Unrestricted knee range of motion, exercises as noted above             Okay to perform ankle range of motion as well.  Encourage patient to wiggle toes to help with swelling             Ice and elevate             PT and OT   -Comminuted right calcaneus  fracture             Nonweightbearing             no surgery at this time                         Risk to high for complications             Will manage non-op             May require future procedure for function and or pain control such as subtalar fusion, amongst other possibilities        - Pain management:             Current regimen does appear to be effective.                             Dilaudid 2 mg 1-2 tabs p.o. every 4 hours as needed for pain  Tylenol 1000 mg p.o. every 6 hours scheduled                         Robaxin 1000 mg p.o. every 6 hours scheduled     - ABL anemia/Hemodynamics             stable    - Medical issues                Appreciate internal medicine's assistance with patient management               Sinus tach                         Appears to continue to improve, current heart rate in the 90s                         CBC is stable                          Echo notable for some IVC dilation grade 1 diastolic dysfunction                          thyroid function possibly component of hyperthyroidism.  Outpatient workup                 ALL                         pt due for next round of infusion therapy                          Looks like this will need to be done as inpatient due to pts physical and social limitations                         transfer to [facility] today      - DVT/PE prophylaxis:             Lovenox while NWB              Monitor plts                Results for BRAYCEN, BURANDT (MRN [id number]) as of 06/11/2017 11:34   Ref. Range 06/08/2017 05:26  WBC Latest Ref Range: 4.0 - 10.5 K/uL 4.9  RBC Latest Ref Range: 4.22 - 5.81 MIL/uL 3.77 (L)  Hemoglobin Latest Ref Range: 13.0 - 17.0 g/dL 11.0 (L)  HCT Latest Ref Range: 39.0 - 52.0 % 35.0 (L)  MCV Latest Ref Range: 78.0 - 100.0 fL 92.8  MCH Latest Ref Range: 26.0 - 34.0 pg 29.2  MCHC Latest Ref Range: 30.0 - 36.0 g/dL 31.4  RDW Latest Ref Range: 11.5 -  15.5 % 16.6 (H)  Platelets Latest Ref Range: 150 - 400 K/uL 192    - ID:              Multi lobar pneumonia                         Patient was treated with Levaquin prior to admission for pneumonia as well                         Patient  completed course of Zosyn, was transitioned to doxycycline and thentransitioned to Augmentin until 06/06/2017- completed. Pt has not had any antibiotics since 06/06/2017                           Continue aggressive incentive spirometry/pulmonary toilet   - Metabolic Bone Disease:             Vitamin D deficiency             Patient with mildly elevated TSH as well as minimally elevated T4.  Not sure this is contributory to the tachycardia.  As above             Supplement vitamin d    - Activity:             Out of bed with assistance             Nonweightbearing bilaterally   - FEN/GI prophylaxis/Foley/Lines:             Regular diet   -Ex-fix/Splint care:             Okay to shower and clean wounds with soap and water             Remove all dressings and bracing     - Dispo:             transfer to [facility] leukemia service today              All communications have been made with patient placement at [facility] as well as the Heme/onc attending on today                         Greatly appreciate how pleasant and accommodating they have been!!!   Disposition: Transfer to [facility], leukemia service  Discharge Instructions    Call MD / Call 911   Complete by:  As directed    If you experience chest pain or shortness of breath, CALL 911 and be transported to the hospital emergency room.  If you develope a fever above 101 F, pus (white drainage) or increased drainage or redness at the wound, or calf pain, call your surgeon's office.   Constipation Prevention   Complete by:  As directed    Drink plenty of fluids.  Prune juice may be helpful.  You may use a stool softener, such as Colace (over the counter) 100 mg  twice a day.  Use MiraLax (over the counter) for constipation as needed.   Diet general   Complete by:  As directed    Discharge instructions   Complete by:  As directed    Orthopaedic Trauma Service Discharge Instructions   General Discharge Instructions  (please see last orthopaedic progress note which is included in DC summary as well)  WEIGHT BEARING STATUS: Nonweightbearing right leg Nonweightbearing left leg  RANGE OF MOTION/ACTIVITY: Unrestricted range of motion Right and left knee  excercise program may including quad sets, straight leg raises, short arc quads, long arc quads, Bilateral ankle Thera-Band program, heel cord stretching are permissible. Other exercises that do not compromise weightbearing restrictions are also permissible   No resting with knee in flexion. Place pillows under ankle or use zero knee bone foam to maintain full extension at rest  daily PT and OT             Pt should be mobilized several times  from bed to chair to prevent pressure sores   Weightbearing will not be permitted until about 07/24/2017  Wound Care:  Daily wound care as needed     Ok to clean wounds with soap and water     See below   Please change compressive wrap to R ankle daily.  Use webril/cast padding against skin and wrap ace wrap starting at foot and going above ankle TED hose to L leg (thigh high)  Discharge Wound Care Instructions  Do NOT apply any ointments, solutions or lotions to pin sites or surgical wounds.  These prevent needed drainage and even though solutions like hydrogen peroxide kill bacteria, they also damage cells lining the pin sites that help fight infection.  Applying lotions or ointments can keep the wounds moist and can cause them to breakdown and open up as well. This can increase the risk for infection. When in doubt call the office.  Surgical incisions should be dressed daily.  If any drainage is noted, use  one layer of adaptic, then gauze, Kerlix, and an ace wrap.  Once the incision is completely dry and without drainage, it may be left open to air out.  Showering may begin 36-48 hours later.  Cleaning gently with soap and water.  Traumatic wounds should be dressed daily as well.    One layer of adaptic, gauze, Kerlix, then ace wrap.  The adaptic can be discontinued once the draining has ceased    If you have a wet to dry dressing: wet the gauze with saline the squeeze as much saline out so the gauze is moist (not soaking wet), place moistened gauze over wound, then place a dry gauze over the moist one, followed by Kerlix wrap, then ace wrap.  CALL OFFICE WITH QUESTIONS OR CONCERNS [phone]    DVT/PE prophylaxis: lovenox 40 mg sq daily x 28 more days (end 07/09/2017)  Diet: as you were eating previously.  Can use over the counter stool softeners and bowel preparations, such as Miralax, to help with bowel movements.  Narcotics can be constipating.  Be sure to drink plenty of fluids  PAIN MEDICATION USE AND EXPECTATIONS  You have likely been given narcotic medications to help control your pain.  After a traumatic event that results in an fracture (broken bone) with or without surgery, it is ok to use narcotic pain medications to help control one's pain.  We understand that everyone responds to pain differently and each individual patient will be evaluated on a regular basis for the continued need for narcotic medications. Ideally, narcotic medication use should last no more than 6-8 weeks (coinciding with fracture healing).   As a patient it is your responsibility as well to monitor narcotic medication use and report the amount and frequency you use these medications when you come to your office visit.   We would also advise that if you are using narcotic medications, you should take a dose prior to therapy to maximize you participation.  IF YOU ARE ON NARCOTIC MEDICATIONS IT IS NOT PERMISSIBLE  TO OPERATE A MOTOR VEHICLE (MOTORCYCLE/CAR/TRUCK/MOPED) OR HEAVY MACHINERY DO NOT MIX NARCOTICS WITH OTHER CNS (CENTRAL NERVOUS SYSTEM) DEPRESSANTS SUCH AS ALCOHOL   STOP SMOKING OR USING NICOTINE PRODUCTS!!!!  As discussed nicotine severely impairs your body's ability to heal surgical and traumatic wounds but also impairs bone healing.  Wounds and bone heal by forming microscopic blood vessels (angiogenesis) and nicotine is a vasoconstrictor (essentially, shrinks blood vessels).  Therefore, if vasoconstriction occurs to  these microscopic blood vessels they essentially disappear and are unable to deliver necessary nutrients to the healing tissue.  This is one modifiable factor that you can do to dramatically increase your chances of healing your injury.    (This means no smoking, no nicotine gum, patches, etc)  DO NOT USE NONSTEROIDAL ANTI-INFLAMMATORY DRUGS (NSAID'S)  Using products such as Advil (ibuprofen), Aleve (naproxen), Motrin (ibuprofen) for additional pain control during fracture healing can delay and/or prevent the healing response.  If you would like to take over the counter (OTC) medication, Tylenol (acetaminophen) is ok.  However, some narcotic medications that are given for pain control contain acetaminophen as well. Therefore, you should not exceed more than 4000 mg of tylenol in a day if you do not have liver disease.  Also note that there are may OTC medicines, such as cold medicines and allergy medicines that my contain tylenol as well.  If you have any questions about medications and/or interactions please ask your doctor/PA or your pharmacist.      ICE AND ELEVATE INJURED/OPERATIVE EXTREMITY  Using ice and elevating the injured extremity above your heart can help with swelling and pain control.  Icing in a pulsatile fashion, such as 20 minutes on and 20 minutes off, can be followed.    Do not place ice directly on skin. Make sure there is a barrier between to skin and the ice pack.     Using frozen items such as frozen peas works well as the conform nicely to the are that needs to be iced.  USE AN ACE WRAP OR TED HOSE FOR SWELLING CONTROL  In addition to icing and elevation, Ace wraps or TED hose are used to help limit and resolve swelling.  It is recommended to use Ace wraps or TED hose until you are informed to stop.    When using Ace Wraps start the wrapping distally (farthest away from the body) and wrap proximally (closer to the body)   Example: If you had surgery on your leg or thing and you do not have a splint on, start the ace wrap at the toes and work your way up to the thigh        If you had surgery on your upper extremity and do not have a splint on, start the ace wrap at your fingers and work your way up to the upper arm  [place]: [phone]   Increase activity slowly as tolerated   Complete by:  As directed    Non weight bearing   Complete by:  As directed    Laterality:  bilateral   Extremity:  Lower     Allergies as of 06/11/2017      Reactions   Vancomycin Other (See Comments)   Reaction 12/24/15 and drug fevers noted 01/15/16      Medication List    STOP taking these medications   levofloxacin 750 MG tablet Commonly known as:  LEVAQUIN     TAKE these medications   acetaminophen 500 MG tablet Commonly known as:  TYLENOL Take 2 tablets (1,000 mg total) every 6 (six) hours by mouth.   ALPRAZolam 0.5 MG tablet Commonly known as:  XANAX Take 1 tablet (0.5 mg total) 2 (two) times daily as needed by mouth for anxiety.   calcium citrate 950 MG tablet Commonly known as:  CALCITRATE - dosed in mg elemental calcium Take 1 tablet (200 mg of elemental calcium total) 2 (two) times  daily by mouth.   Cholecalciferol 2000 units Tabs Take 1 tablet (2,000 Units total) 2 (two) times daily by mouth.   dasatinib 70 MG tablet Commonly known as:  SPRYCEL Take 70 mg by mouth at bedtime.   enoxaparin 40 MG/0.4ML  injection Commonly known as:  LOVENOX Inject 0.4 mLs (40 mg total) daily for 27 days into the skin. Start taking on:  06/12/2017   HYDROmorphone 2 MG tablet Commonly known as:  DILAUDID Take 1-2 tablets (2-4 mg total) every 4 (four) hours as needed by mouth for moderate pain or severe pain.   methocarbamol 500 MG tablet Commonly known as:  ROBAXIN Take 2 tablets (1,000 mg total) every 6 (six) hours as needed by mouth for muscle spasms.   metoprolol tartrate 25 MG tablet Commonly known as:  LOPRESSOR Take 1 tablet (25 mg total) 2 (two) times daily by mouth.   polyethylene glycol packet Commonly known as:  MIRALAX / GLYCOLAX Take 17 g daily by mouth. Start taking on:  06/12/2017   senna-docusate 8.6-50 MG tablet Commonly known as:  Senokot-S Take 1 tablet at bedtime by mouth.            Discharge Care Instructions  (From admission, onward)        Start     Ordered   06/11/17 0000  Non weight bearing    Question Answer Comment  Laterality bilateral   Extremity Lower      06/11/17 1241     Follow-up Information    Haddix, Thomasene Lot, MD. Schedule an appointment as soon as possible for a visit in 3 week(s).   Specialty:  Orthopedic Surgery Contact information: [address] [place] [address] [phone]           Discharge Instructions and Plan:   31 year old male status post fall with bilateral lower extremity fractures history complicated by active ALL   -Left lateral tibial plateau fracture s/p ORIF             Nonweightbearing x 6 weeks             Unrestricted range of motion left knee             Home exercise program including quad sets, straight leg raises, short arc quads, long arc quads, ankle Thera-Band program are permissible.  Please see sticky note in the chart             No resting with knee in flexion.  Place pillows under ankle or use zero knee bone foam to maintain full extension at rest                     PT and OT                Ice and elevate             TED hose   -Right tibial shaft fracture status post IM nail             Nonweightbearing  x 8  weeks              Unrestricted knee range of motion, exercises as noted above             Okay to perform ankle range of motion as well.  Encourage patient to wiggle toes to help with swelling             Ice and elevate  PT and OT   -Comminuted right calcaneus fracture             Nonweightbearing             no surgery at this time                         Risk to high for complications             Will manage non-op             May require future procedure for function and or pain control such as subtalar fusion, amongst other possibilities        - Pain management:             Current regimen does appear to be effective.                             Dilaudid 2 mg 1-2 tabs p.o. every 4 hours as needed for pain                         Tylenol 1000 mg p.o. every 6 hours scheduled                         Robaxin 1000 mg p.o. every 6 hours scheduled     - ABL anemia/Hemodynamics             stable    - Medical issues                Appreciate internal medicine's assistance with patient management               Sinus tach                         Appears to continue to improve, current heart rate in the 90s                         CBC is stable                          Echo notable for some IVC dilation grade 1 diastolic dysfunction                          thyroid function possibly component of hyperthyroidism.  Outpatient workup                 ALL                         pt due for next round of infusion therapy                          Looks like this will need to be done as inpatient due to pts physical and social limitations                         transfer to [facility] today      - DVT/PE prophylaxis:             Lovenox while NWB              Monitor plts  Results for ZYIR, GASSERT (MRN [id number]) as of  06/11/2017 11:34   Ref. Range 06/08/2017 05:26  WBC Latest Ref Range: 4.0 - 10.5 K/uL 4.9  RBC Latest Ref Range: 4.22 - 5.81 MIL/uL 3.77 (L)  Hemoglobin Latest Ref Range: 13.0 - 17.0 g/dL 11.0 (L)  HCT Latest Ref Range: 39.0 - 52.0 % 35.0 (L)  MCV Latest Ref Range: 78.0 - 100.0 fL 92.8  MCH Latest Ref Range: 26.0 - 34.0 pg 29.2  MCHC Latest Ref Range: 30.0 - 36.0 g/dL 31.4  RDW Latest Ref Range: 11.5 - 15.5 % 16.6 (H)  Platelets Latest Ref Range: 150 - 400 K/uL 192    - ID:              Multi lobar pneumonia                         Patient was treated with Levaquin prior to admission for pneumonia as well                         Patient completed course of Zosyn, was transitioned to doxycycline and thentransitioned to Augmentin until 06/06/2017- completed. Pt has not had any antibiotics since 06/06/2017                           Continue aggressive incentive spirometry/pulmonary toilet   - Metabolic Bone Disease:             Vitamin D deficiency             Patient with mildly elevated TSH as well as minimally elevated T4.  Not sure this is contributory to the tachycardia.  As above             Supplement vitamin d    - Activity:             Out of bed with assistance             Nonweightbearing bilaterally    Weightbearing activities will not be permitted until approximately 07/24/2017, may consider weightbearing as tolerated for transfers on the left leg starting 07/10/2017   - FEN/GI prophylaxis/Foley/Lines:             Regular diet   -Ex-fix/Splint care:             Okay to shower and clean wounds with soap and water             Remove all dressings and bracing     - Dispo:             transfer to [facility] leukemia service today              All communications have been made with patient placement at [facility] as well as the Heme/onc attending on today                         Greatly appreciate how pleasant and accommodating they have been!!!  Signed:  Jari Pigg,  PA-C [facility] [phone] (P) 06/11/2017, 12:43 PM

## 2017-06-11 NOTE — Progress Notes (Signed)
Orthopedic Trauma Service Progress Note   Patient ID: Wayne Grimes MRN: [id number] DOB/AGE: 29/13/1987 29 y.o.  Subjective:  No new issues  Discussed case with Heme/Onc (leukemia service) at [facility]. They will accept pt in transfer for inpt administration of chemo infusion   Spanish interpretor at bedside Plan discussed once again with the patient Continues to ask when his next surgery is  Again reviewed in extensive detail why we are not operating on his right calcaneus.  Patient is still too swollen with fracture blistering.  We feel that the risk is too great to proceed with surgical intervention.  He has high risk for the development of postsurgical complications in this area including infection which could possibly lead to limb loss.  Patient may regain adequate function without surgical intervention of his calcaneus but may require future reconstruction/fusion  Orthopedic issues are stable   Review of Systems  Constitutional: Negative for chills and fever.  Respiratory: Negative for shortness of breath and wheezing.   Cardiovascular: Negative for chest pain and palpitations.  Neurological: Negative for tingling and sensory change.    Objective:   VITALS:   Vitals:   06/10/17 0535 06/10/17 1528 06/10/17 2121 06/11/17 0940  BP: 125/71 113/67 (!) 112/53 113/64  Pulse: 95 94 98 (!) 103  Resp: 17 18 16    Temp: 98 F (36.7 C) 98 F (36.7 C) 98.1 F (36.7 C)   TempSrc: Oral Oral Oral   SpO2: 95% 96% 98%   Weight:      Height:        Estimated body mass index is 36.15 kg/m as calculated from the following:   Height as of this encounter: 5\' 6"  (1.676 m).   Weight as of this encounter: 101.6 kg (224 lb).   Intake/Output      11/07 0701 - 11/08 0700 11/08 0701 - 11/09 0700   P.O. 720    Total Intake(mL/kg) 720 (7.1)    Urine (mL/kg/hr) 450 (0.2)    Stool 0    Total Output 450    Net +270         Stool Occurrence 2 x       LABS  No results found for this or any previous visit (from the past 24 hour(s)).   PHYSICAL EXAM:  Gen: Appears comfortable, awake and alert Lungs: unlabored Cardiac: Regular Abd:+ Bowel sounds, nontender Ext:       Right lower extremity             compressive dressing right foot and ankle removed                          Moderate swelling still present                         Anterior blistering stable                         Skin does not wrinkle laterally              Incision right knee and proximal lower leg are stable, no signs of infection, no drainage             Patient able to perform active knee extension and flexion.  He can perform straight leg raise             Pt able to perform some ankle ROM  Distal motor and sensory functions are grossly intact             Extremity is warm       Left lower extremity              All wounds are stable             Motor and sensory functions are intact             Patient can perform active knee flexion extension as well as perform quad set and straight leg raise without difficulty.             Extremity is warm             Moderate swelling             No deep calf tenderness             + DP pulse             Compartments are soft and nontender, no pain with passive stretching             Excellent knee range of motion  Assessment/Plan: 13 Days Post-Op   Principal Problem:   Accidental fall from ladder Active Problems:   Displaced comminuted fracture of shaft of right tibia, initial encounter for closed fracture   Displaced fracture of body of right calcaneus, initial encounter for closed fracture   Closed fracture of lateral portion of left tibial plateau   History of DVT (deep vein thrombosis)   Pneumonia   Anti-infectives (From admission, onward)   Start     Dose/Rate Route Frequency Ordered Stop   06/03/17 1415  amoxicillin-clavulanate (AUGMENTIN) 875-125 MG per tablet 1 tablet     1 tablet  Oral Every 12 hours 06/03/17 1342 06/06/17 2112   06/03/17 1345  amoxicillin-clavulanate (AUGMENTIN) 875-125 MG per tablet 1 tablet  Status:  Discontinued     1 tablet Oral Every 12 hours 06/03/17 1335 06/03/17 1342   06/01/17 1400  doxycycline (VIBRA-TABS) tablet 100 mg  Status:  Discontinued     100 mg Oral Every 12 hours 06/01/17 1341 06/03/17 1335   06/01/17 1345  levofloxacin (LEVAQUIN) tablet 500 mg  Status:  Discontinued     500 mg Oral Daily 06/01/17 1331 06/01/17 1341   05/29/17 1400  ceFAZolin (ANCEF) IVPB 2g/100 mL premix  Status:  Discontinued     2 g 200 mL/hr over 30 Minutes Intravenous Every 8 hours 05/29/17 1319 05/29/17 1352   05/29/17 1014  vancomycin (VANCOCIN) powder  Status:  Discontinued       As needed 05/29/17 1014 05/29/17 1054   05/29/17 0000  ceFAZolin (ANCEF) IVPB 2g/100 mL premix     2 g 200 mL/hr over 30 Minutes Intravenous To Surgery 05/28/17 1013 05/30/17 0000   05/28/17 2200  piperacillin-tazobactam (ZOSYN) IVPB 3.375 g  Status:  Discontinued     3.375 g 12.5 mL/hr over 240 Minutes Intravenous Every 8 hours 05/28/17 1522 06/01/17 1331   05/28/17 1600  piperacillin-tazobactam (ZOSYN) IVPB 3.375 g     3.375 g 100 mL/hr over 30 Minutes Intravenous  Once 05/28/17 1522 05/28/17 1745   05/28/17 1100  levofloxacin (LEVAQUIN) IVPB 750 mg  Status:  Discontinued     750 mg 100 mL/hr over 90 Minutes Intravenous Every 24 hours 05/28/17 0916 05/28/17 1445   05/28/17 1000  levofloxacin (LEVAQUIN) tablet 750 mg  Status:  Discontinued     750 mg Oral Daily 05/27/17  2154 05/28/17 0916    .  POD/HD#: 45  31 year old male status post fall with bilateral lower extremity fractures history complicated by active ALL   -Left lateral tibial plateau fracture s/p ORIF             Nonweightbearing x 6 weeks             Unrestricted range of motion left knee             Home exercise program including quad sets, straight leg raises, short arc quads, long arc quads, ankle  Thera-Band program are permissible.  Please see sticky note in the chart             No resting with knee in flexion.  Place pillows under ankle or use zero knee bone foam to maintain full extension at rest                     PT and OT               Ice and elevate             TED hose   -Right tibial shaft fracture status post IM nail             Nonweightbearing times 8 weeks              Unrestricted knee range of motion, exercises as noted above             Okay to perform ankle range of motion as well.  Encourage patient to wiggle toes to help with swelling             Ice and elevate             PT and OT   -Comminuted right calcaneus fracture             Nonweightbearing             no surgery at this time   Risk to high for complications  Will manage non-op  May require future procedure for function and or pain control such as subtalar fusion, amongst other possibilities        - Pain management:             Current regimen does appear to be effective.                             Dilaudid 2 mg 1-2 tabs p.o. every 4 hours as needed for pain                         Tylenol 1000 mg p.o. every 6 hours scheduled                         Robaxin 1000 mg p.o. every 6 hours scheduled     - ABL anemia/Hemodynamics             stable    - Medical issues                Appreciate internal medicine's assistance with patient management               Sinus tach                         Appears to continue to improve,  current heart rate in the 90s                         CBC is stable                          Echo notable for some IVC dilation grade 1 diastolic dysfunction                          thyroid function possibly component of hyperthyroidism.  Outpatient workup                 ALL                         pt due for next round of infusion therapy                          Looks like this will need to be done as inpatient due to pts physical and social limitations                          transfer to [facility] today     - DVT/PE prophylaxis:             Lovenox while NWB   Monitor plts     Results for KATRELL, MILHORN (MRN [id number]) as of 06/11/2017 11:34  Ref. Range 06/08/2017 05:26  WBC Latest Ref Range: 4.0 - 10.5 K/uL 4.9  RBC Latest Ref Range: 4.22 - 5.81 MIL/uL 3.77 (L)  Hemoglobin Latest Ref Range: 13.0 - 17.0 g/dL 11.0 (L)  HCT Latest Ref Range: 39.0 - 52.0 % 35.0 (L)  MCV Latest Ref Range: 78.0 - 100.0 fL 92.8  MCH Latest Ref Range: 26.0 - 34.0 pg 29.2  MCHC Latest Ref Range: 30.0 - 36.0 g/dL 31.4  RDW Latest Ref Range: 11.5 - 15.5 % 16.6 (H)  Platelets Latest Ref Range: 150 - 400 K/uL 192   - ID:              Multi lobar pneumonia                         Patient was treated with Levaquin prior to admission for pneumonia as well                         Patient completed course of Zosyn, was transitioned to doxycycline and thentransitioned to Augmentin until 06/06/2017- completed. Pt has not had any antibiotics since 06/06/2017                          Continue aggressive incentive spirometry/pulmonary toilet   - Metabolic Bone Disease:             Vitamin D deficiency             Patient with mildly elevated TSH as well as minimally elevated T4.  Not sure this is contributory to the tachycardia.  As above  Supplement vitamin d   - Activity:             Out of bed with assistance             Nonweightbearing bilaterally   - FEN/GI prophylaxis/Foley/Lines:  Regular diet   -Ex-fix/Splint care:             Okay to shower and clean wounds with soap and water             Remove all dressings and bracing     - Dispo:             transfer to [facility] leukemia service today   All communications have been made with patient placement at [facility] as well as the Heme/onc attending on today   Greatly appreciate how pleasant and accommodating they have been!!!    Jari Pigg, PA-C [facility] [phone]  [phone] (C) 06/11/2017, 11:26 AM

## 2017-06-11 NOTE — Care Management Note (Signed)
Case Management Note  Patient Details  Name: Irving Bloor MRN: [id number] Date of Birth: 1986-06-04  Subjective/Objective:                 Spoke w MD. Plan is for transfer to [facility] for inpatient treatment related to ALL. Patient will be a one way transfer, will ultimately DC from [facility] Transportation will be arranged by through [employer]  by nursing staff. CM signing off.    Action/Plan:   Expected Discharge Date:                  Expected Discharge Plan:  Acute to Acute Transfer  In-House Referral:     Discharge planning Services  CM Consult  Post Acute Care Choice:    Choice offered to:     DME Arranged:    DME Agency:     HH Arranged:    HH Agency:     Status of Service:  Completed, signed off  If discussed at [employer] of Stay Meetings, dates discussed:    Additional Comments:  Carles Collet, RN 06/11/2017, 10:15 AM

## 2017-06-11 NOTE — Progress Notes (Signed)
Physical Therapy Treatment Patient Details Name: Wayne Grimes MRN: [id number] DOB: September 19, 1985 Today's Date: 06/11/2017    History of Present Illness Pt is a 31 y.o. male who sustained a 12 foot fall from a ladder. 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 Recently diagnosed with community-aquired pneumonia. Pt with L tibial plateau fracture s/p ORIF and R tibial shaft fracture s/p IM nailing. Also sustained R calcaneus fracture.     PT Comments    Patient was able to perform AP transfer bed <> recliner with min/mod A. Pt led through bilat LE therex. HEP and positioning of bilat LE reviewed with pt. Interpreter, Rob Bunting, present.  Current plan remains appropriate.   Follow Up Recommendations  SNF     Equipment Recommendations  Wheelchair (measurements PT);Other (comment);Wheelchair cushion (measurements PT)    Recommendations for Other Services       Precautions / Restrictions Precautions Precautions: Fall Precaution Comments: B LE NWB Restrictions Weight Bearing Restrictions: Yes RLE Weight Bearing: Non weight bearing LLE Weight Bearing: Non weight bearing    Mobility  Bed Mobility Overal bed mobility: Needs Assistance Bed Mobility: Supine to Sit;Sit to Supine     Supine to sit: Min guard Sit to supine: Min guard   General bed mobility comments: cues for sequencing and technique; min guard for safety   Transfers Overall transfer level: Needs assistance   Transfers: Comptroller transfers: Mod assist   General transfer comment: cues for sequencing and technique; pt demonstrated good trunk control; assist to scoot hips backwards into recliner and assist to bring bilat LE forward onto bed  Ambulation/Gait                 Stairs            Wheelchair Mobility    Modified Rankin (Stroke Patients Only)       Balance Overall balance assessment: Needs  assistance Sitting-balance support: No upper extremity supported Sitting balance-Leahy Scale: Good                                      Cognition Arousal/Alertness: Awake/alert Behavior During Therapy: WFL for tasks assessed/performed Overall Cognitive Status: Within Functional Limits for tasks assessed                                        Exercises Total Joint Exercises Ankle Circles/Pumps: AROM;10 reps;Supine;Both Heel Slides: AAROM;Left;10 reps;Supine;AROM;Right General Exercises - Lower Extremity Quad Sets: AROM;Both;10 reps;Supine Straight Leg Raises: AROM;Both;5 reps;Supine    General Comments        Pertinent Vitals/Pain Pain Assessment: Faces Faces Pain Scale: Hurts even more Pain Location: bilat LE (R > L)(B LE) Pain Descriptors / Indicators: Grimacing;Guarding;Sore Pain Intervention(s): Limited activity within patient's tolerance;Monitored during session;Premedicated before session;Repositioned    Home Living                      Prior Function            PT Goals (current goals can now be found in the care plan section) Acute Rehab PT Goals PT Goal Formulation: With patient Time For Goal Achievement: 06/13/17 Potential to Achieve Goals: Good Progress towards PT goals: Progressing toward goals    Frequency    Min  3X/week      PT Plan Current plan remains appropriate    Co-evaluation              AM-PAC PT "6 Clicks" Daily Activity  Outcome Measure  Difficulty turning over in bed (including adjusting bedclothes, sheets and blankets)?: A Lot Difficulty moving from lying on back to sitting on the side of the bed? : A Lot Difficulty sitting down on and standing up from a chair with arms (e.g., wheelchair, bedside commode, etc,.)?: A Lot Help needed moving to and from a bed to chair (including a wheelchair)?: A Little Help needed walking in hospital room?: Total Help needed climbing 3-5 steps with a  railing? : Total 6 Click Score: 11    End of Session   Activity Tolerance: Patient tolerated treatment well Patient left: with call bell/phone within reach;in bed Nurse Communication: Mobility status PT Visit Diagnosis: Muscle weakness (generalized) (M62.81);Pain;Other abnormalities of gait and mobility (R26.89) Pain - Right/Left: Right Pain - part of body: Leg     Time: 1037-1100 PT Time Calculation (min) (ACUTE ONLY): 23 min  Charges:  $Therapeutic Exercise: 8-22 mins $Therapeutic Activity: 8-22 mins                    G Codes:       Earney Navy, PTA Pager: [phone]     Darliss Cheney 06/11/2017, 3:16 PM

## 2017-06-11 NOTE — Progress Notes (Signed)
Removed IV, all questions and concerns addressed, Pt not in distress. Called and gave report to Page, RN at [facility], discharged to another facility via [facility] with all Pt belongings.

## 2017-10-06 ENCOUNTER — Other Ambulatory Visit (HOSPITAL_COMMUNITY): Payer: Self-pay | Admitting: Student

## 2017-10-06 DIAGNOSIS — M7989 Other specified soft tissue disorders: Principal | ICD-10-CM

## 2017-10-06 DIAGNOSIS — M79604 Pain in right leg: Secondary | ICD-10-CM

## 2017-10-07 ENCOUNTER — Ambulatory Visit (HOSPITAL_COMMUNITY)
Admission: RE | Admit: 2017-10-07 | Discharge: 2017-10-07 | Disposition: A | Discharge status: Home / Self Care | Payer: PRIVATE HEALTH INSURANCE | Source: Ambulatory Visit | Attending: Vascular Surgery | Admitting: Vascular Surgery

## 2017-10-07 DIAGNOSIS — M7989 Other specified soft tissue disorders: Secondary | ICD-10-CM | POA: Diagnosis present

## 2017-10-07 DIAGNOSIS — M79604 Pain in right leg: Secondary | ICD-10-CM

## 2017-10-07 NOTE — Progress Notes (Signed)
*  Preliminary Results* Right lower extremity venous duplex completed. Right lower extremity is negative for deep vein thrombosis. There is no evidence of right Baker's cyst.  10/07/2017 10:21 AM  Maudry Mayhew, BS, RVT, RDCS, RDMS

## 2019-09-02 IMAGING — DX DG CHEST 1V PORT
1 series · 1 of 1 positions shown · non-contrast
Comparison: 06/02/2012

CLINICAL DATA: Hypoxia

EXAM:
PORTABLE CHEST 1 VIEW

[chest ap]
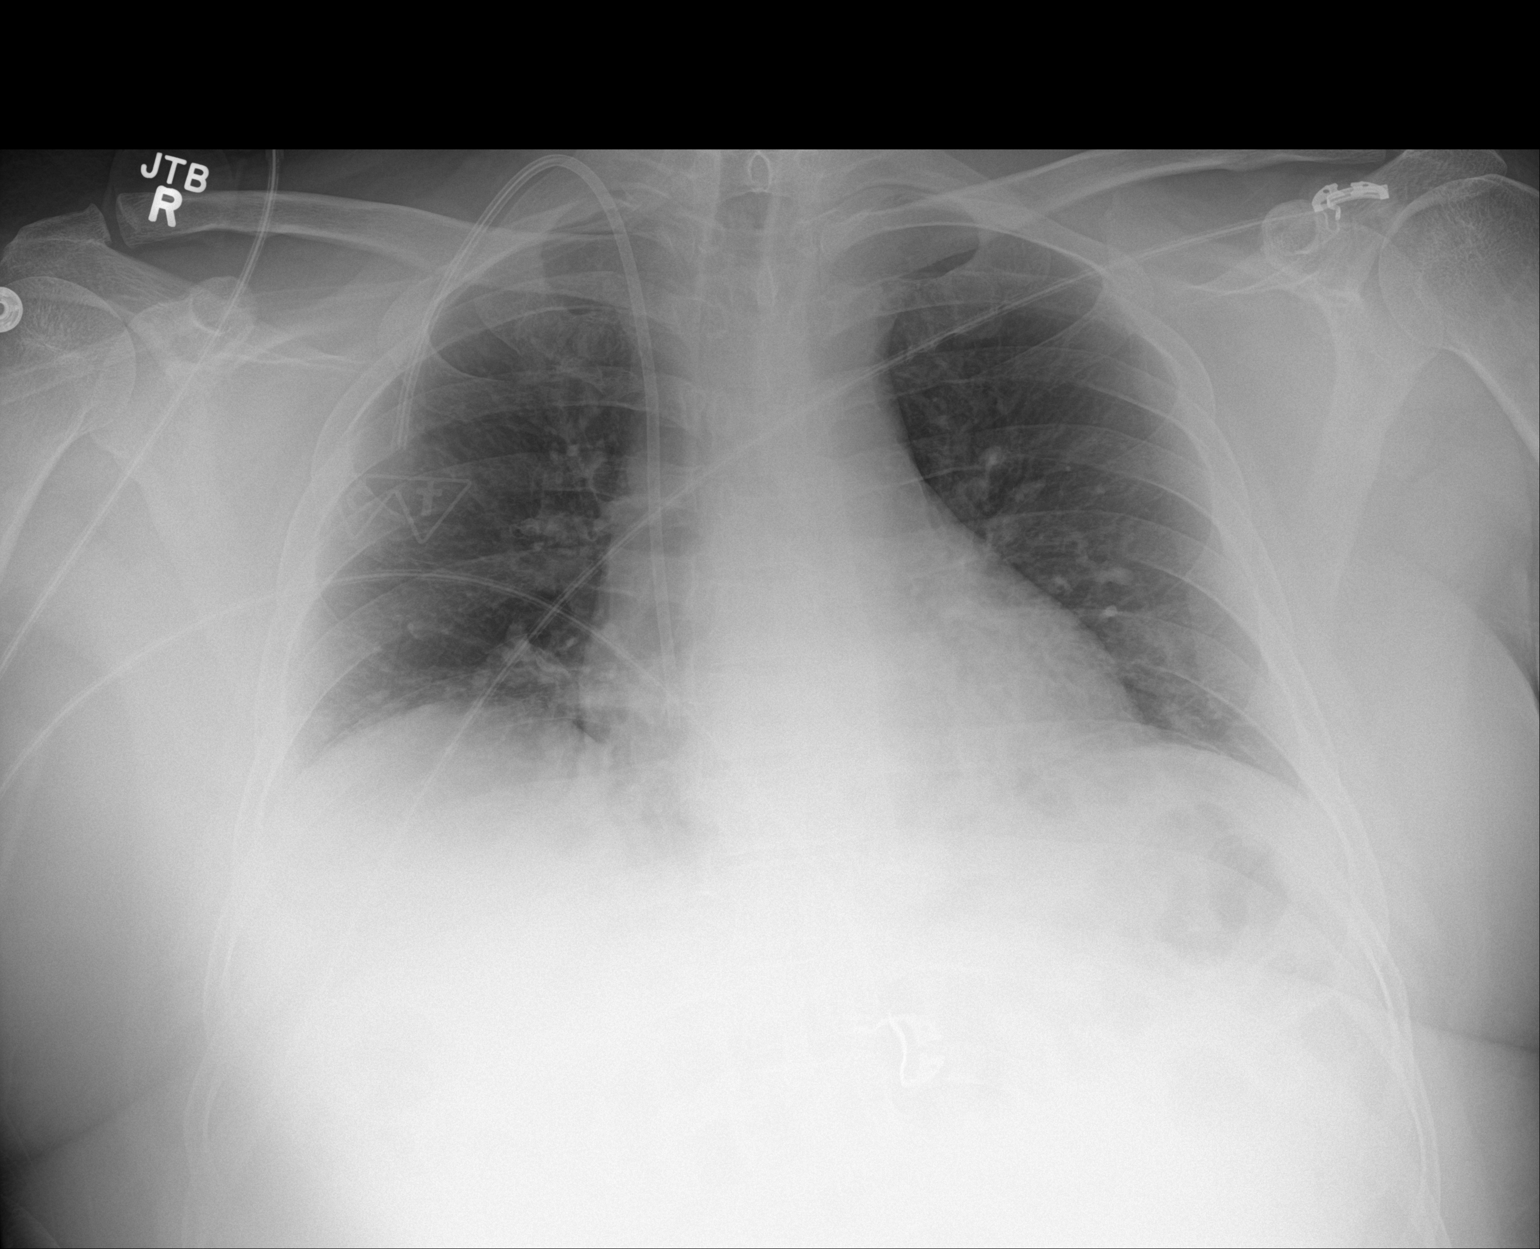

[1 of 1 positions shown; findings below may reference images not displayed]

FINDINGS: Cardiac shadow is within normal limits. Right-sided chest wall port
is now seen in satisfactory position. Lungs are hypoinflated with
very minimal right basilar atelectatic changes. No pneumothorax is
seen. No acute bony abnormality is noted.
IMPRESSION: Hypoaeration with mild right basilar atelectasis.

## 2019-09-03 IMAGING — CR DG KNEE 1-2V PORT*L*
1 series · 1 of 1 positions shown · non-contrast
Comparison: None.

CLINICAL DATA: Status post ORIF of left tibial fracture

EXAM:
PORTABLE LEFT KNEE - 1-2 VIEW

[lateral]
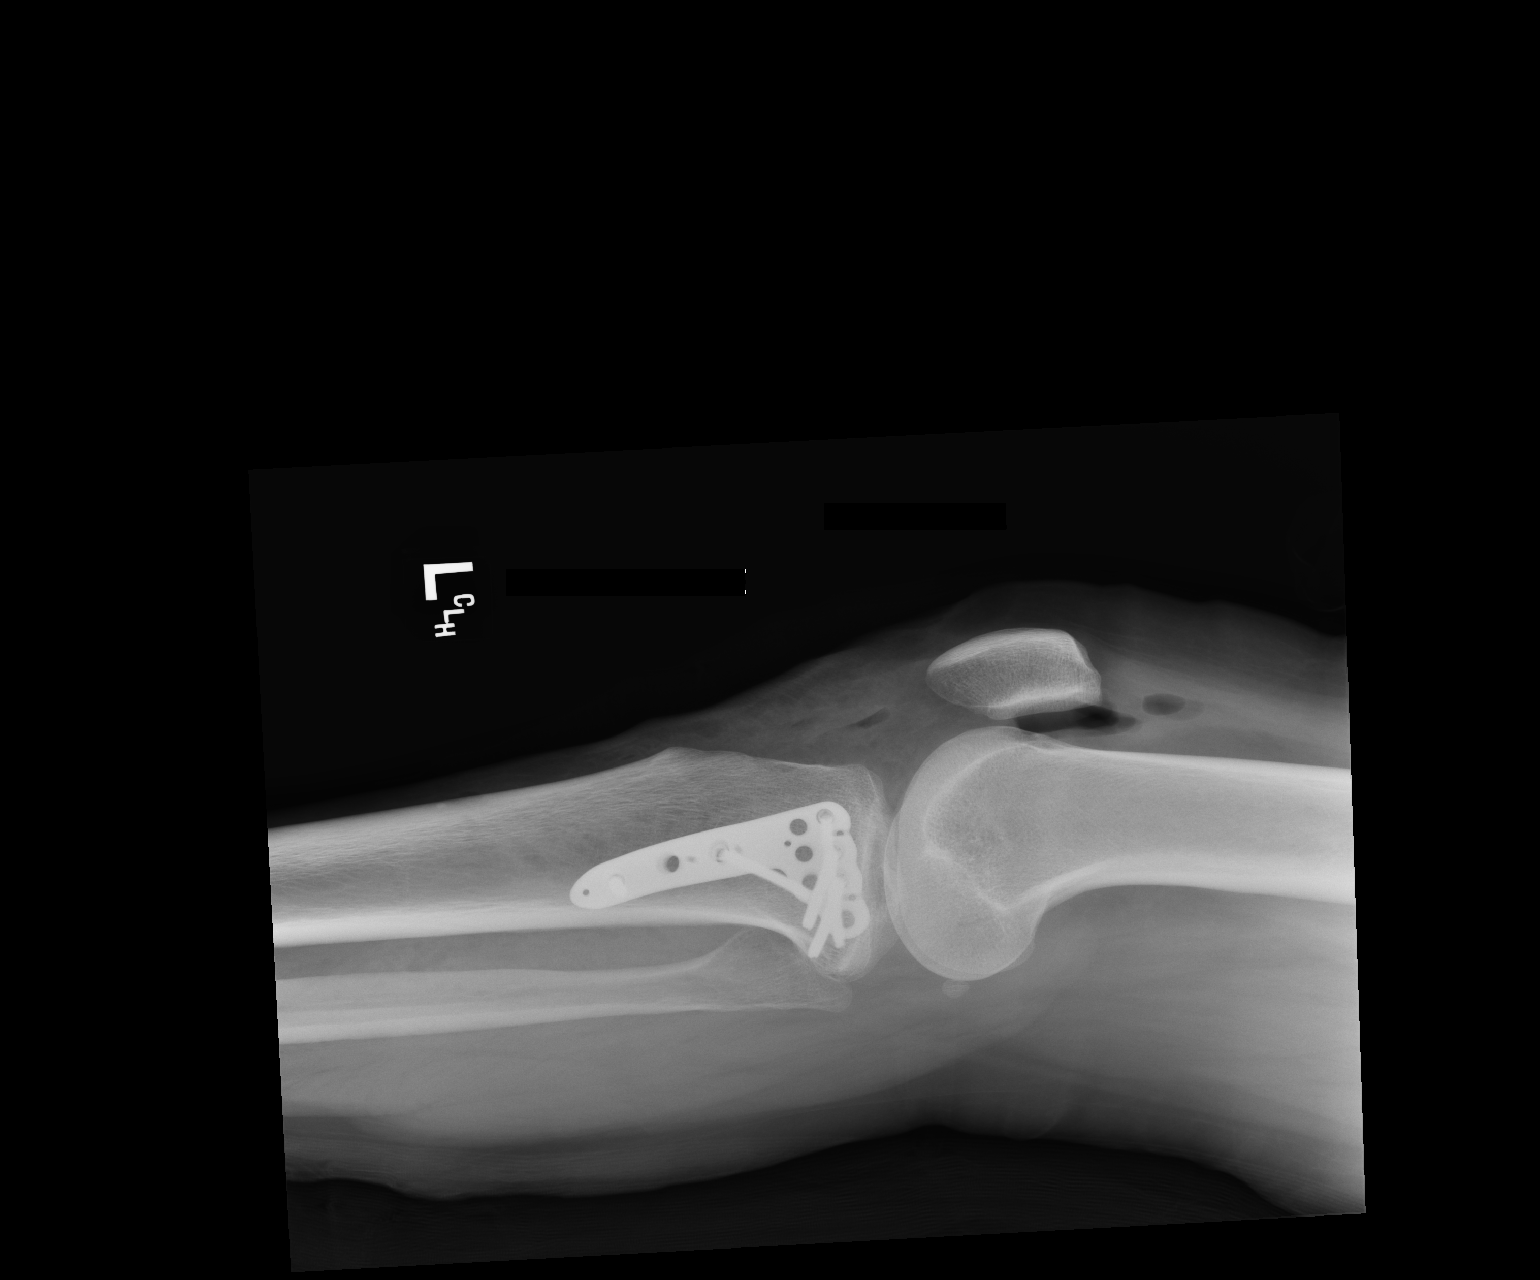

[1 of 1 positions shown; findings below may reference images not displayed]

FINDINGS: Lateral fixation plate with multiple fixation screws is now seen.
The fracture fragments are in near anatomic alignment. Soft tissue
changes consistent with the recent surgery are seen. Air is noted
within the knee joint.
IMPRESSION: Status post ORIF of proximal left tibial fracture.
